# Patient Record
Sex: Male | Born: 1990
Health system: Southern US, Community
[De-identification: ages and names within clinical notes are randomized; demographics above are authoritative.]

## PROBLEM LIST (undated history)

## (undated) DIAGNOSIS — I10 Essential (primary) hypertension: Secondary | ICD-10-CM

## (undated) DIAGNOSIS — Z87442 Personal history of urinary calculi: Secondary | ICD-10-CM

## (undated) DIAGNOSIS — F32A Depression, unspecified: Secondary | ICD-10-CM

## (undated) DIAGNOSIS — F419 Anxiety disorder, unspecified: Secondary | ICD-10-CM

## (undated) DIAGNOSIS — K219 Gastro-esophageal reflux disease without esophagitis: Secondary | ICD-10-CM

## (undated) DIAGNOSIS — F329 Major depressive disorder, single episode, unspecified: Secondary | ICD-10-CM

## (undated) DIAGNOSIS — R011 Cardiac murmur, unspecified: Secondary | ICD-10-CM

## (undated) HISTORY — DX: Depression, unspecified: F32.A

## (undated) HISTORY — DX: Major depressive disorder, single episode, unspecified: F32.9

## (undated) HISTORY — DX: Anxiety disorder, unspecified: F41.9

## (undated) HISTORY — DX: Cardiac murmur, unspecified: R01.1

---

## 2015-02-14 DIAGNOSIS — F411 Generalized anxiety disorder: Secondary | ICD-10-CM | POA: Insufficient documentation

## 2016-05-19 ENCOUNTER — Encounter (INDEPENDENT_AMBULATORY_CARE_PROVIDER_SITE_OTHER): Payer: Self-pay

## 2016-07-27 ENCOUNTER — Ambulatory Visit: Payer: Self-pay | Admitting: Family Medicine

## 2016-08-04 ENCOUNTER — Encounter: Payer: Self-pay | Admitting: Family Medicine

## 2016-08-04 ENCOUNTER — Ambulatory Visit (INDEPENDENT_AMBULATORY_CARE_PROVIDER_SITE_OTHER): Payer: 59 | Admitting: Family Medicine

## 2016-08-04 VITALS — BP 135/90 | HR 116 | Ht 72.0 in | Wt 270.0 lb

## 2016-08-04 DIAGNOSIS — F331 Major depressive disorder, recurrent, moderate: Secondary | ICD-10-CM

## 2016-08-04 DIAGNOSIS — F419 Anxiety disorder, unspecified: Secondary | ICD-10-CM | POA: Diagnosis not present

## 2016-08-04 DIAGNOSIS — F329 Major depressive disorder, single episode, unspecified: Secondary | ICD-10-CM | POA: Insufficient documentation

## 2016-08-04 DIAGNOSIS — F32A Depression, unspecified: Secondary | ICD-10-CM | POA: Insufficient documentation

## 2016-08-04 MED ORDER — SERTRALINE HCL 100 MG PO TABS: 150.0000 mg | ORAL_TABLET | Freq: Every day | ORAL | 1 refills | Status: DC

## 2016-08-04 NOTE — Assessment & Plan Note (Signed)
Under good control. Continue current regimen. Continue to monitor. Recheck 6 months at physical.

## 2016-08-04 NOTE — Progress Notes (Signed)
BP 135/90   Pulse (!) 116   Ht 6' (1.829 m)   Wt 270 lb (122.5 kg)   SpO2 96%   BMI 36.62 kg/m    Subjective:    Patient ID: Jacob [name], male    DOB: 1991-05-06, 26 y.o.   MRN: [id number]  HPI: Jacob [name] is a 26 y.o. male who presents today to establish care. Hasn't seen a doctor in about a 1.5 years.   Chief Complaint  Patient presents with  . Establish Care  . Anxiety   ANXIETY/STRESS- ran out of his medicine about a month ago for about a month.  Duration:stable Anxious mood: yes  Excessive worrying: no Irritability: no  Sweating: no Nausea: no Palpitations:no Hyperventilation: no Panic attacks: no Agoraphobia: no  Obscessions/compulsions: no Depressed mood: no Depression screen [facility] 2/9 08/04/2016  Decreased Interest 0  Down, Depressed, Hopeless 0  PHQ - 2 Score 0  Altered sleeping 0  Tired, decreased energy 2  Change in appetite 1  Feeling bad or failure about yourself  0  Trouble concentrating 0  Moving slowly or fidgety/restless 0  Suicidal thoughts 0  PHQ-9 Score 3   GAD 7 : Generalized Anxiety Score 08/04/2016  Nervous, Anxious, on Edge 1  Control/stop worrying 0  Worry too much - different things 0  Trouble relaxing 0  Restless 0  Easily annoyed or irritable 1  Afraid - awful might happen 0  Total GAD 7 Score 2  Anxiety Difficulty Not difficult at all    Anhedonia: no Weight changes: no Insomnia: no   Hypersomnia: no Fatigue/loss of energy: no Feelings of worthlessness: no Feelings of guilt: no Impaired concentration/indecisiveness: no Suicidal ideations: no  Crying spells: no Recent Stressors/Life Changes: no  Active Ambulatory Problems    Diagnosis Date Noted  . Anxiety   . Depression    Resolved Ambulatory Problems    Diagnosis Date Noted  . No Resolved Ambulatory Problems   Past Medical History:  Diagnosis Date  . Anxiety   . Depression    No past surgical history on file.  Outpatient Encounter Prescriptions as of  08/04/2016  Medication Sig  . sertraline (ZOLOFT) 100 MG tablet Take 1.5 tablets (150 mg total) by mouth daily.  . [DISCONTINUED] sertraline (ZOLOFT) 100 MG tablet Take 150 mg by mouth daily.   No facility-administered encounter medications on file as of 08/04/2016.    Not on File  Social History   Social History  . Marital status: Single    Spouse name: N/A  . Number of children: N/A  . Years of education: N/A   Occupational History  . Not on file.   Social History Main Topics  . Smoking status: Never Smoker  . Smokeless tobacco: Never Used  . Alcohol use Yes     Comment: Socially  . Drug use: Unknown  . Sexual activity: Not on file   Other Topics Concern  . Not on file   Social History Narrative  . No narrative on file   Family History  Problem Relation Age of Onset  . Autoimmune disease Father   . Autoimmune disease Sister   . Autoimmune disease Brother      Review of Systems  Constitutional: Negative.   Respiratory: Negative.   Cardiovascular: Negative.   Psychiatric/Behavioral: Negative for agitation, behavioral problems, confusion, decreased concentration, dysphoric mood, hallucinations, self-injury, sleep disturbance and suicidal ideas. The patient is nervous/anxious. The patient is not hyperactive.     Per HPI unless specifically  indicated above     Objective:    BP 135/90   Pulse (!) 116   Ht 6' (1.829 m)   Wt 270 lb (122.5 kg)   SpO2 96%   BMI 36.62 kg/m   Wt Readings from Last 3 Encounters:  08/04/16 270 lb (122.5 kg)    Physical Exam  Constitutional: He is oriented to person, place, and time. He appears well-developed and well-nourished. No distress.  HENT:  Head: Normocephalic and atraumatic.  Right Ear: Hearing normal.  Left Ear: Hearing normal.  Nose: Nose normal.  Eyes: Conjunctivae and lids are normal. Right eye exhibits no discharge. Left eye exhibits no discharge. No scleral icterus.  Cardiovascular: Regular rhythm, normal heart  sounds and intact distal pulses.  Tachycardia present.  Exam reveals no gallop and no friction rub.   No murmur heard. Pulmonary/Chest: Effort normal and breath sounds normal. No respiratory distress. He has no wheezes. He has no rales. He exhibits no tenderness.  Musculoskeletal: Normal range of motion.  Neurological: He is alert and oriented to person, place, and time.  Skin: Skin is warm, dry and intact. No rash noted. No erythema. No pallor.  Psychiatric: He has a normal mood and affect. His speech is normal and behavior is normal. Judgment and thought content normal. Cognition and memory are normal.  Nursing note and vitals reviewed.   No results found for this or any previous visit.    Assessment & Plan:   Problem List Items Addressed This Visit      Other   Anxiety - Primary    Under good control. Continue current regimen. Continue to monitor. Recheck 6 months at physical.      Relevant Medications   sertraline (ZOLOFT) 100 MG tablet   Depression    Under good control. Continue current regimen. Continue to monitor. Recheck 6 months at physical.      Relevant Medications   sertraline (ZOLOFT) 100 MG tablet       Follow up plan: Return in about 6 months (around 02/04/2017) for Physical- records release please.

## 2016-11-21 ENCOUNTER — Other Ambulatory Visit: Payer: Self-pay | Admitting: Family Medicine

## 2017-02-08 ENCOUNTER — Encounter: Payer: 59 | Admitting: Family Medicine

## 2017-11-18 ENCOUNTER — Other Ambulatory Visit: Payer: Self-pay | Admitting: Family Medicine

## 2017-11-19 NOTE — Telephone Encounter (Signed)
Attempted to call patient to schedule an office visit. No answer, left message for patient to call the office so his refill for Zoloft can be complete.

## 2018-01-26 ENCOUNTER — Encounter: Payer: Self-pay | Admitting: Family Medicine

## 2018-01-26 ENCOUNTER — Ambulatory Visit (INDEPENDENT_AMBULATORY_CARE_PROVIDER_SITE_OTHER): Payer: 59 | Admitting: Family Medicine

## 2018-01-26 ENCOUNTER — Other Ambulatory Visit: Payer: Self-pay

## 2018-01-26 ENCOUNTER — Encounter

## 2018-01-26 VITALS — BP 128/85 | HR 93 | Temp 99.0°F | Ht 72.0 in | Wt 274.4 lb

## 2018-01-26 DIAGNOSIS — R030 Elevated blood-pressure reading, without diagnosis of hypertension: Secondary | ICD-10-CM | POA: Diagnosis not present

## 2018-01-26 DIAGNOSIS — Z23 Encounter for immunization: Secondary | ICD-10-CM | POA: Diagnosis not present

## 2018-01-26 DIAGNOSIS — Z13220 Encounter for screening for lipoid disorders: Secondary | ICD-10-CM

## 2018-01-26 DIAGNOSIS — F3342 Major depressive disorder, recurrent, in full remission: Secondary | ICD-10-CM | POA: Diagnosis not present

## 2018-01-26 DIAGNOSIS — E669 Obesity, unspecified: Secondary | ICD-10-CM

## 2018-01-26 LAB — UA/M W/RFLX CULTURE, ROUTINE
Bilirubin, UA: NEGATIVE
Glucose, UA: NEGATIVE
Ketones, UA: NEGATIVE
Leukocytes, UA: NEGATIVE
Nitrite, UA: NEGATIVE
Protein, UA: NEGATIVE
RBC, UA: NEGATIVE
Specific Gravity, UA: 1.02 (ref 1.005–1.030)
Urobilinogen, Ur: 0.2 mg/dL (ref 0.2–1.0)
pH, UA: 7.5 (ref 5.0–7.5)

## 2018-01-26 LAB — BAYER DCA HB A1C WAIVED: HB A1C (BAYER DCA - WAIVED): 4.9 % (ref <7.0)

## 2018-01-26 MED ORDER — SERTRALINE HCL 100 MG PO TABS: ORAL_TABLET | ORAL | 1 refills | Status: DC

## 2018-01-26 NOTE — Progress Notes (Signed)
BP 128/85 (BP Location: Left Arm, Cuff Size: Normal)   Pulse 93   Temp 99 F (37.2 C) (Oral)   Ht 6' (1.829 m)   Wt 274 lb 6.4 oz (124.5 kg)   SpO2 97%   BMI 37.22 kg/m    Subjective:    Patient ID: Jacob [name], male    DOB: 05-17-1991, 27 y.o.   MRN: [id number]  HPI: Jacob [name] is a 27 y.o. male who presents today after being lost to follow up for 15 months.   Chief Complaint  Patient presents with  . Depression    pt would like to discuss about zoloft  . Anxiety   DEPRESSION-  Mood status: stable Satisfied with current treatment?: yes Symptom severity: mild  Duration of current treatment : chronic Side effects: no Medication compliance: excellent compliance Psychotherapy/counseling: no  Previous psychiatric medications: zoloft Depressed mood: no Anxious mood: no Anhedonia: no Significant weight loss or gain: no Insomnia: no  Fatigue: no Feelings of worthlessness or guilt: no Impaired concentration/indecisiveness: no Suicidal ideations: no Hopelessness: no Crying spells: no Depression screen [facility] 2/9 01/26/2018 08/04/2016  Decreased Interest 0 0  Down, Depressed, Hopeless 0 0  PHQ - 2 Score 0 0  Altered sleeping 1 0  Tired, decreased energy 1 2  Change in appetite 0 1  Feeling bad or failure about yourself  0 0  Trouble concentrating 0 0  Moving slowly or fidgety/restless 0 0  Suicidal thoughts 0 0  PHQ-9 Score 2 3  Difficult doing work/chores Not difficult at all -   GAD 7 : Generalized Anxiety Score 01/26/2018 08/04/2016  Nervous, Anxious, on Edge 0 1  Control/stop worrying 0 0  Worry too much - different things 0 0  Trouble relaxing 0 0  Restless 1 0  Easily annoyed or irritable 1 1  Afraid - awful might happen 0 0  Total GAD 7 Score 2 2  Anxiety Difficulty Not difficult at all Not difficult at all     Relevant past medical, surgical, family and social history reviewed and updated as indicated. Interim medical history since our last visit  reviewed. Allergies and medications reviewed and updated.  Review of Systems  Constitutional: Negative.   Respiratory: Negative.   Cardiovascular: Negative.   Psychiatric/Behavioral: Negative.     Per HPI unless specifically indicated above     Objective:    BP 128/85 (BP Location: Left Arm, Cuff Size: Normal)   Pulse 93   Temp 99 F (37.2 C) (Oral)   Ht 6' (1.829 m)   Wt 274 lb 6.4 oz (124.5 kg)   SpO2 97%   BMI 37.22 kg/m   Wt Readings from Last 3 Encounters:  01/26/18 274 lb 6.4 oz (124.5 kg)  08/04/16 270 lb (122.5 kg)    Physical Exam  Constitutional: He is oriented to person, place, and time. He appears well-developed and well-nourished. No distress.  HENT:  Head: Normocephalic and atraumatic.  Right Ear: Hearing normal.  Left Ear: Hearing normal.  Nose: Nose normal.  Eyes: Conjunctivae and lids are normal. Right eye exhibits no discharge. Left eye exhibits no discharge. No scleral icterus.  Cardiovascular: Normal rate, regular rhythm, normal heart sounds and intact distal pulses. Exam reveals no gallop and no friction rub.  No murmur heard. Pulmonary/Chest: Effort normal and breath sounds normal. No stridor. No respiratory distress. He has no wheezes. He has no rales. He exhibits no tenderness.  Musculoskeletal: Normal range of motion.  Neurological: He is alert  and oriented to person, place, and time.  Skin: Skin is warm, dry and intact. Capillary refill takes less than 2 seconds. No rash noted. He is not diaphoretic. No erythema. No pallor.  Psychiatric: He has a normal mood and affect. His speech is normal and behavior is normal. Judgment and thought content normal. Cognition and memory are normal.  Nursing note and vitals reviewed.   No results found for this or any previous visit.    Assessment & Plan:   Problem List Items Addressed This Visit      Other   Depression - Primary    Under good control on current regimen. Continue current regimen.  Continue to monitor. Call with any concerns. Refills given today.      Relevant Medications   sertraline (ZOLOFT) 100 MG tablet   Obesity (BMI 30-39.9)    Will work on diet and exercise. Checking labs today. Await results.       Relevant Orders   CBC with Differential/Platelet   Bayer DCA Hb A1c Waived   Comprehensive metabolic panel   TSH   UA/M w/rflx Culture, Routine    Other Visit Diagnoses    Flu vaccine need       Flu shot given today.   Relevant Orders   Flu Vaccine QUAD 36+ mos IM (Completed)   Need for Tdap vaccination       Tdap given today.   Relevant Orders   Tdap vaccine greater than or equal to 7yo IM (Completed)   Elevated blood pressure reading       Will work on [employer] and exercise. Recheck at physical. Call with any concerns.    Relevant Orders   CBC with Differential/Platelet   Comprehensive metabolic panel   UA/M w/rflx Culture, Routine   Screening for cholesterol level       Labs drawn today. Await results.    Relevant Orders   Lipid Panel w/o Chol/HDL Ratio   UA/M w/rflx Culture, Routine       Follow up plan: Return in about 6 months (around 07/27/2018) for physical.

## 2018-01-26 NOTE — Assessment & Plan Note (Signed)
Under good control on current regimen. Continue current regimen. Continue to monitor. Call with any concerns. Refills given today.   

## 2018-01-26 NOTE — Patient Instructions (Addendum)
Tdap Vaccine (Tetanus, Diphtheria and Pertussis): What You Need to Know 1. Why get vaccinated? Tetanus, diphtheria and pertussis are very serious diseases. Tdap vaccine can protect us from these diseases. And, Tdap vaccine given to pregnant women can protect newborn babies against pertussis. TETANUS (Lockjaw) is rare in the [place] today. It causes painful muscle tightening and stiffness, usually all over the body.  It can lead to tightening of muscles in the head and neck so you can't open your mouth, swallow, or sometimes even breathe. Tetanus kills about 1 out of 10 people who are infected even after receiving the best medical care.  DIPHTHERIA is also rare in the [place] today. It can cause a thick coating to form in the back of the throat.  It can lead to breathing problems, heart failure, paralysis, and death.  PERTUSSIS (Whooping Cough) causes severe coughing spells, which can cause difficulty breathing, vomiting and disturbed sleep.  It can also lead to weight loss, incontinence, and rib fractures. Up to 2 in 100 adolescents and 5 in 100 adults with pertussis are hospitalized or have complications, which could include pneumonia or death.  These diseases are caused by bacteria. Diphtheria and pertussis are spread from person to person through secretions from coughing or sneezing. Tetanus enters the body through cuts, scratches, or wounds. Before vaccines, as many as 200,000 cases of diphtheria, 200,000 cases of pertussis, and hundreds of cases of tetanus, were reported in the [place] each year. Since vaccination began, reports of cases for tetanus and diphtheria have dropped by about 99% and for pertussis by about 80%. 2. Tdap vaccine Tdap vaccine can protect adolescents and adults from tetanus, diphtheria, and pertussis. One dose of Tdap is routinely given at age 11 or 12. People who did not get Tdap at that age should get it as soon as possible. Tdap is especially  important for healthcare professionals and anyone having close contact with a baby younger than 12 months. Pregnant women should get a dose of Tdap during every pregnancy, to protect the newborn from pertussis. Infants are most at risk for severe, life-threatening complications from pertussis. Another vaccine, called Td, protects against tetanus and diphtheria, but not pertussis. A Td booster should be given every 10 years. Tdap may be given as one of these boosters if you have never gotten Tdap before. Tdap may also be given after a severe cut or burn to prevent tetanus infection. Your doctor or the person giving you the vaccine can give you more information. Tdap may safely be given at the same time as other vaccines. 3. Some people should not get this vaccine  A person who has ever had a life-threatening allergic reaction after a previous dose of any diphtheria, tetanus or pertussis containing vaccine, OR has a severe allergy to any part of this vaccine, should not get Tdap vaccine. Tell the person giving the vaccine about any severe allergies.  Anyone who had coma or long repeated seizures within 7 days after a childhood dose of DTP or DTaP, or a previous dose of Tdap, should not get Tdap, unless a cause other than the vaccine was found. They can still get Td.  Talk to your doctor if you: ? have seizures or another nervous system problem, ? had severe pain or swelling after any vaccine containing diphtheria, tetanus or pertussis, ? ever had a condition called Guillain-Barr Syndrome (GBS), ? aren't feeling well on the day the shot is scheduled. 4. Risks With any medicine, including   vaccines, there is a chance of side effects. These are usually mild and go away on their own. Serious reactions are also possible but are rare. Most people who get Tdap vaccine do not have any problems with it. Mild problems following Tdap: (Did not interfere with activities)  Pain where the shot was given (about  3 in 4 adolescents or 2 in 3 adults)  Redness or swelling where the shot was given (about 1 person in 5)  Mild fever of at least 100.4F (up to about 1 in 25 adolescents or 1 in 100 adults)  Headache (about 3 or 4 people in 10)  Tiredness (about 1 person in 3 or 4)  Nausea, vomiting, diarrhea, stomach ache (up to 1 in 4 adolescents or 1 in 10 adults)  Chills, sore joints (about 1 person in 10)  Body aches (about 1 person in 3 or 4)  Rash, swollen glands (uncommon)  Moderate problems following Tdap: (Interfered with activities, but did not require medical attention)  Pain where the shot was given (up to 1 in 5 or 6)  Redness or swelling where the shot was given (up to about 1 in 16 adolescents or 1 in 12 adults)  Fever over 102F (about 1 in 100 adolescents or 1 in 250 adults)  Headache (about 1 in 7 adolescents or 1 in 10 adults)  Nausea, vomiting, diarrhea, stomach ache (up to 1 or 3 people in 100)  Swelling of the entire arm where the shot was given (up to about 1 in 500).  Severe problems following Tdap: (Unable to perform usual activities; required medical attention)  Swelling, severe pain, bleeding and redness in the arm where the shot was given (rare).  Problems that could happen after any vaccine:  People sometimes faint after a medical procedure, including vaccination. Sitting or lying down for about 15 minutes can help prevent fainting, and injuries caused by a fall. Tell your doctor if you feel dizzy, or have vision changes or ringing in the ears.  Some people get severe pain in the shoulder and have difficulty moving the arm where a shot was given. This happens very rarely.  Any medication can cause a severe allergic reaction. Such reactions from a vaccine are very rare, estimated at fewer than 1 in a million doses, and would happen within a few minutes to a few hours after the vaccination. As with any medicine, there is a very remote chance of a vaccine  causing a serious injury or death. The safety of vaccines is always being monitored. For more information, visit: www.cdc.gov/vaccinesafety/ 5. What if there is a serious problem? What should I look for? Look for anything that concerns you, such as signs of a severe allergic reaction, very high fever, or unusual behavior. Signs of a severe allergic reaction can include hives, swelling of the face and throat, difficulty breathing, a fast heartbeat, dizziness, and weakness. These would usually start a few minutes to a few hours after the vaccination. What should I do?  If you think it is a severe allergic reaction or other emergency that can't wait, call 9-1-1 or get the person to the nearest hospital. Otherwise, call your doctor.  Afterward, the reaction should be reported to the Vaccine Adverse Event Reporting System (VAERS). Your doctor might file this report, or you can do it yourself through the VAERS web site at www.vaers.hhs.gov, or by calling 1-800-822-7967. ? VAERS does not give medical advice. 6. The National Vaccine Injury Compensation Program The National   Vaccine Injury Compensation Program (VICP) is a federal program that was created to compensate people who may have been injured by certain vaccines. Persons who believe they may have been injured by a vaccine can learn about the program and about filing a claim by calling 628-509-7091 or visiting the [place] website at GoldCloset.com.ee. There is a time limit to file a claim for compensation. 7. How can I learn more?  Ask your doctor. He or she can give you the vaccine package insert or suggest other sources of information.  Call your local or state health department.  Contact the Centers for Disease Control and Prevention (CDC): ? Call (872) 268-8698 (1-800-CDC-INFO) or ? Visit CDC's website at http://hunter.com/ CDC Tdap Vaccine VIS (07/18/13) This information is not intended to replace advice given to you by your  health care provider. Make sure you discuss any questions you have with your health care provider. Document Released: 11/10/2011 Document Revised: 01/30/2016 Document Reviewed: 01/30/2016 Elsevier Interactive Patient Education  2017 [place] DASH stands for "Dietary Approaches to Stop Hypertension." The DASH eating plan is a healthy eating plan that has been shown to reduce high blood pressure (hypertension). It may also reduce your risk for type 2 diabetes, heart disease, and stroke. The DASH eating plan may also help with weight loss. What are tips for following this plan? General guidelines  Avoid eating more than 2,300 mg (milligrams) of salt (sodium) a day. If you have hypertension, you may need to reduce your sodium intake to 1,500 mg a day.  Limit alcohol intake to no more than 1 drink a day for nonpregnant women and 2 drinks a day for men. One drink equals 12 oz of beer, 5 oz of wine, or 1 oz of hard liquor.  Work with your health care provider to maintain a healthy body weight or to lose weight. Ask what an ideal weight is for you.  Get at least 30 minutes of exercise that causes your heart to beat faster (aerobic exercise) most days of the week. Activities may include walking, swimming, or biking.  Work with your health care provider or diet and nutrition specialist (dietitian) to adjust your eating plan to your individual calorie needs. Reading food labels  Check food labels for the amount of sodium per serving. Choose foods with less than 5 percent of the Daily Value of sodium. Generally, foods with less than 300 mg of sodium per serving fit into this eating plan.  To find whole grains, look for the word "whole" as the first word in the ingredient list. Shopping  Buy products labeled as "low-sodium" or "no salt added."  Buy fresh foods. Avoid canned foods and premade or frozen meals. Cooking  Avoid adding salt when cooking. Use salt-free seasonings  or herbs instead of table salt or sea salt. Check with your health care provider or pharmacist before using salt substitutes.  Do not fry foods. Cook foods using healthy methods such as baking, boiling, grilling, and broiling instead.  Cook with heart-healthy oils, such as olive, canola, soybean, or sunflower oil. Meal planning   Eat a balanced diet that includes: ? 5 or more servings of fruits and vegetables each day. At each meal, try to fill half of your plate with fruits and vegetables. ? Up to 6-8 servings of whole grains each day. ? Less than 6 oz of lean meat, poultry, or fish each day. A 3-oz serving of meat is about the same size as a deck  of cards. One egg equals 1 oz. ? 2 servings of low-fat dairy each day. ? A serving of nuts, seeds, or beans 5 times each week. ? Heart-healthy fats. Healthy fats called Omega-3 fatty acids are found in foods such as flaxseeds and coldwater fish, like sardines, salmon, and mackerel.  Limit how much you eat of the following: ? Canned or prepackaged foods. ? Food that is high in trans fat, such as fried foods. ? Food that is high in saturated fat, such as fatty meat. ? Sweets, desserts, sugary drinks, and other foods with added sugar. ? Full-fat dairy products.  Do not salt foods before eating.  Try to eat at least 2 vegetarian meals each week.  Eat more home-cooked food and less restaurant, buffet, and fast food.  When eating at a restaurant, ask that your food be prepared with less salt or no salt, if possible. What foods are recommended? The items listed may not be a complete list. Talk with your dietitian about what dietary choices are best for you. Grains Whole-grain or whole-wheat bread. Whole-grain or whole-wheat pasta. Brown rice. [place]. Bulgur. Whole-grain and low-sodium cereals. Pita bread. Low-fat, low-sodium crackers. Whole-wheat flour tortillas. Vegetables Fresh or frozen vegetables (raw, steamed, roasted, or  grilled). Low-sodium or reduced-sodium tomato and vegetable juice. Low-sodium or reduced-sodium tomato sauce and tomato paste. Low-sodium or reduced-sodium canned vegetables. Fruits All fresh, dried, or frozen fruit. Canned fruit in natural juice (without added sugar). Meat and other protein foods Skinless chicken or [place]. Ground chicken or [place]. Pork with fat trimmed off. Fish and seafood. Egg whites. Dried beans, peas, or lentils. Unsalted nuts, nut butters, and seeds. Unsalted canned beans. Lean cuts of beef with fat trimmed off. Low-sodium, lean deli meat. Dairy Low-fat (1%) or fat-free (skim) milk. Fat-free, low-fat, or reduced-fat cheeses. Nonfat, low-sodium ricotta or cottage cheese. Low-fat or nonfat yogurt. Low-fat, low-sodium cheese. Fats and oils Soft margarine without trans fats. Vegetable oil. Low-fat, reduced-fat, or light mayonnaise and salad dressings (reduced-sodium). Canola, safflower, olive, soybean, and sunflower oils. Avocado. Seasoning and other foods Herbs. Spices. Seasoning mixes without salt. Unsalted popcorn and pretzels. Fat-free sweets. What foods are not recommended? The items listed may not be a complete list. Talk with your dietitian about what dietary choices are best for you. Grains Baked goods made with fat, such as croissants, muffins, or some breads. Dry pasta or rice meal packs. Vegetables Creamed or fried vegetables. Vegetables in a cheese sauce. Regular canned vegetables (not low-sodium or reduced-sodium). Regular canned tomato sauce and paste (not low-sodium or reduced-sodium). Regular tomato and vegetable juice (not low-sodium or reduced-sodium). Angie Fava. Olives. Fruits Canned fruit in a light or heavy syrup. Fried fruit. Fruit in cream or butter sauce. Meat and other protein foods Fatty cuts of meat. Ribs. Fried meat. Berniece Salines. Sausage. Bologna and other processed lunch meats. Salami. Fatback. Hotdogs. Bratwurst. Salted nuts and seeds. Canned beans with  added salt. Canned or smoked fish. Whole eggs or egg yolks. Chicken or [place] with skin. Dairy Whole or 2% milk, cream, and half-and-half. Whole or full-fat cream cheese. Whole-fat or sweetened yogurt. Full-fat cheese. Nondairy creamers. Whipped toppings. Processed cheese and cheese spreads. Fats and oils Butter. Stick margarine. Lard. Shortening. Ghee. Bacon fat. Tropical oils, such as coconut, palm kernel, or palm oil. Seasoning and other foods Salted popcorn and pretzels. Onion salt, garlic salt, seasoned salt, table salt, and sea salt. Worcestershire sauce. Tartar sauce. Barbecue sauce. Teriyaki sauce. Soy sauce, including reduced-sodium. Steak sauce. Canned and  packaged gravies. Fish sauce. Oyster sauce. Cocktail sauce. Horseradish that you find on the shelf. Ketchup. Mustard. Meat flavorings and tenderizers. Bouillon cubes. Hot sauce and Tabasco sauce. Premade or packaged marinades. Premade or packaged taco seasonings. Relishes. Regular salad dressings. Where to find more information:  National Heart, Lung, and [place]: https://wilson-eaton.com/  American Heart Association: www.heart.org Summary  The DASH eating plan is a healthy eating plan that has been shown to reduce high blood pressure (hypertension). It may also reduce your risk for type 2 diabetes, heart disease, and stroke.  With the DASH eating plan, you should limit salt (sodium) intake to 2,300 mg a day. If you have hypertension, you may need to reduce your sodium intake to 1,500 mg a day.  When on the DASH eating plan, aim to eat more fresh fruits and vegetables, whole grains, lean proteins, low-fat dairy, and heart-healthy fats.  Work with your health care provider or diet and nutrition specialist (dietitian) to adjust your eating plan to your individual calorie needs. This information is not intended to replace advice given to you by your health care provider. Make sure you discuss any questions you have with your health care  provider. Document Released: 04/30/2011 Document Revised: 05/04/2016 Document Reviewed: 05/04/2016 Elsevier Interactive Patient Education  2018 [employer].  Preventing Hypertension Hypertension, commonly called high blood pressure, is when the force of blood pumping through the arteries is too strong. Arteries are blood vessels that carry blood from the heart throughout the body. Over time, hypertension can damage the arteries and decrease blood flow to important parts of the body, including the brain, heart, and kidneys. Often, hypertension does not cause symptoms until blood pressure is very high. For this reason, it is important to have your blood pressure checked on a regular basis. Hypertension can often be prevented with diet and lifestyle changes. If you already have hypertension, you can control it with diet and lifestyle changes, as well as medicine. What nutrition changes can be made? Maintain a healthy diet. This includes:  Eating less salt (sodium). Ask your health care provider how much sodium is safe for you to have. The general recommendation is to consume less than 1 tsp (2,300 mg) of sodium a day. ? Do not add salt to your food. ? Choose low-sodium options when grocery shopping and eating out.  Limiting fats in your diet. You can do this by eating low-fat or fat-free dairy products and by eating less red meat.  Eating more fruits, vegetables, and whole grains. Make a goal to eat: ? 1-2 cups of fresh fruits and vegetables each day. ? 3-4 servings of whole grains each day.  Avoiding foods and beverages that have added sugars.  Eating fish that contain healthy fats (omega-3 fatty acids), such as mackerel or salmon.  If you need help putting together a healthy eating plan, try the DASH diet. This diet is high in fruits, vegetables, and whole grains. It is low in sodium, red meat, and added sugars. DASH stands for Dietary Approaches to Stop Hypertension. What lifestyle  changes can be made?  Lose weight if you are overweight. Losing just 3?5% of your body weight can help prevent or control hypertension. ? For example, if your present weight is 200 lb (91 kg), a loss of 3-5% of your weight means losing 6-10 lb (2.7-4.5 kg). ? Ask your health care provider to help you with a diet and exercise plan to safely lose weight.  Get enough exercise. Do at least  150 minutes of moderate-intensity exercise each week. ? You could do this in short exercise sessions several times a day, or you could do longer exercise sessions a few times a week. For example, you could take a brisk 10-minute walk or bike ride, 3 times a day, for 5 days a week.  Find ways to reduce stress, such as exercising, meditating, listening to music, or taking a yoga class. If you need help reducing stress, ask your health care provider.  Do not smoke. This includes e-cigarettes. Chemicals in tobacco and nicotine products raise your blood pressure each time you smoke. If you need help quitting, ask your health care provider.  Avoid alcohol. If you drink alcohol, limit alcohol intake to no more than 1 drink a day for nonpregnant women and 2 drinks a day for men. One drink equals 12 oz of beer, 5 oz of wine, or 1 oz of hard liquor. Why are these changes important? Diet and lifestyle changes can help you prevent hypertension, and they may make you feel better overall and improve your quality of life. If you have hypertension, making these changes will help you control it and help prevent major complications, such as:  Hardening and narrowing of arteries that supply blood to: ? Your heart. This can cause a heart attack. ? Your brain. This can cause a stroke. ? Your kidneys. This can cause kidney failure.  Stress on your heart muscle, which can cause heart failure.  What can I do to lower my risk?  Work with your health care provider to make a hypertension prevention plan that works for you. Follow your  plan and keep all follow-up visits as told by your health care provider.  Learn how to check your blood pressure at home. Make sure that you know your personal target blood pressure, as told by your health care provider. How is this treated? In addition to diet and lifestyle changes, your health care provider may recommend medicines to help lower your blood pressure. You may need to try a few different medicines to find what works best for you. You also may need to take more than one medicine. Take over-the-counter and prescription medicines only as told by your health care provider. Where to find support: Your health care provider can help you prevent hypertension and help you keep your blood pressure at a healthy level. Your local hospital or your community may also provide support services and prevention programs. The American Heart Association offers an online support network at: CheapBootlegs.com.cy Where to find more information: Learn more about hypertension from:  National Heart, Lung, and Blood Institute: ElectronicHangman.is  Centers for Disease Control and Prevention: https://ingram.com/  American Academy of Family Physicians: http://familydoctor.org/familydoctor/en/diseases-conditions/high-blood-pressure.printerview.all.html  Learn more about the DASH diet from:  [place], Lung, and [place]: https://www.reyes.com/  Contact a health care provider if:  You think you are having a reaction to medicines you have taken.  You have recurrent headaches or feel dizzy.  You have swelling in your ankles.  You have trouble with your vision. Summary  Hypertension often does not cause any symptoms until blood pressure is very high. It is important to get your blood pressure checked regularly.  Diet and lifestyle changes are the most important steps in preventing  hypertension.  By keeping your blood pressure in a healthy range, you can prevent complications like heart attack, heart failure, stroke, and kidney failure.  Work with your health care provider to make a hypertension prevention plan that works  for you. This information is not intended to replace advice given to you by your health care provider. Make sure you discuss any questions you have with your health care provider. Document Released: 05/26/2015 Document Revised: 01/20/2016 Document Reviewed: 01/20/2016 Elsevier Interactive Patient Education  [employer].

## 2018-01-26 NOTE — Assessment & Plan Note (Signed)
Will work on diet and exercise. Checking labs today. Await results.

## 2018-01-27 ENCOUNTER — Encounter: Payer: Self-pay | Admitting: Family Medicine

## 2018-01-27 LAB — CBC WITH DIFFERENTIAL/PLATELET
BASOS ABS: 0 10*3/uL (ref 0.0–0.2)
Basos: 0 %
EOS (ABSOLUTE): 0.1 10*3/uL (ref 0.0–0.4)
EOS: 2 %
HEMATOCRIT: 43.7 % (ref 37.5–51.0)
Hemoglobin: 15.4 g/dL (ref 13.0–17.7)
IMMATURE GRANULOCYTES: 1 %
Immature Grans (Abs): 0 10*3/uL (ref 0.0–0.1)
LYMPHS ABS: 1.4 10*3/uL (ref 0.7–3.1)
Lymphs: 22 %
MCH: 30.6 pg (ref 26.6–33.0)
MCHC: 35.2 g/dL (ref 31.5–35.7)
MCV: 87 fL (ref 79–97)
MONOCYTES: 7 %
Monocytes Absolute: 0.4 10*3/uL (ref 0.1–0.9)
NEUTROS PCT: 68 %
Neutrophils Absolute: 4.6 10*3/uL (ref 1.4–7.0)
Platelets: 227 10*3/uL (ref 150–450)
RBC: 5.03 x10E6/uL (ref 4.14–5.80)
RDW: 12.3 % (ref 12.3–15.4)
WBC: 6.6 10*3/uL (ref 3.4–10.8)

## 2018-01-27 LAB — COMPREHENSIVE METABOLIC PANEL
ALK PHOS: 125 IU/L — AB (ref 39–117)
ALT: 34 IU/L (ref 0–44)
AST: 25 IU/L (ref 0–40)
Albumin/Globulin Ratio: 2 (ref 1.2–2.2)
Albumin: 4.5 g/dL (ref 3.5–5.5)
BUN/Creatinine Ratio: 12 (ref 9–20)
BUN: 10 mg/dL (ref 6–20)
Bilirubin Total: 0.4 mg/dL (ref 0.0–1.2)
CALCIUM: 9.9 mg/dL (ref 8.7–10.2)
CO2: 25 mmol/L (ref 20–29)
CREATININE: 0.84 mg/dL (ref 0.76–1.27)
Chloride: 102 mmol/L (ref 96–106)
GFR calc Af Amer: 140 mL/min/{1.73_m2} (ref >59)
GFR, EST NON AFRICAN AMERICAN: 121 mL/min/{1.73_m2} (ref >59)
GLOBULIN, TOTAL: 2.2 g/dL (ref 1.5–4.5)
GLUCOSE: 106 mg/dL — AB (ref 65–99)
Potassium: 4.3 mmol/L (ref 3.5–5.2)
Sodium: 144 mmol/L (ref 134–144)
Total Protein: 6.7 g/dL (ref 6.0–8.5)

## 2018-01-27 LAB — LIPID PANEL W/O CHOL/HDL RATIO
Cholesterol, Total: 161 mg/dL (ref 100–199)
HDL: 34 mg/dL — AB (ref >39)
LDL CALC: 79 mg/dL (ref 0–99)
Triglycerides: 238 mg/dL — ABNORMAL HIGH (ref 0–149)
VLDL CHOLESTEROL CAL: 48 mg/dL — AB (ref 5–40)

## 2018-01-27 LAB — TSH: TSH: 1.76 u[IU]/mL (ref 0.450–4.500)

## 2018-06-13 ENCOUNTER — Encounter: Payer: Self-pay | Admitting: Family Medicine

## 2018-07-25 ENCOUNTER — Other Ambulatory Visit: Payer: Self-pay | Admitting: Family Medicine

## 2018-07-25 NOTE — Telephone Encounter (Signed)
Requested Prescriptions  Pending Prescriptions Disp Refills  . sertraline (ZOLOFT) 100 MG tablet [Pharmacy Med Name: SERTRALINE HCL 100 MG TABLET] 45 tablet 1    Sig: TAKE 1&1/2 TABLET BY MOUTH ONCE DAILY     Psychiatry:  Antidepressants - SSRI Passed - 07/25/2018  2:05 AM      Passed - Completed PHQ-2 or PHQ-9 in the last 360 days.      Passed - Valid encounter within last 6 months    Recent Outpatient Visits          6 months ago Recurrent major depressive disorder, in full remission [facility])   [place], [place], DO   1 year ago [place], [place], DO      Future Appointments            In 3 weeks Wynetta Emery, Barb Merino, DO [facility]

## 2018-08-11 ENCOUNTER — Encounter: Payer: Self-pay | Admitting: Family Medicine

## 2018-08-18 ENCOUNTER — Encounter: Payer: Self-pay | Admitting: Family Medicine

## 2018-08-27 ENCOUNTER — Other Ambulatory Visit: Payer: Self-pay | Admitting: Family Medicine

## 2018-08-29 NOTE — Telephone Encounter (Signed)
Needs virtual visit follow up

## 2018-09-07 NOTE — Telephone Encounter (Signed)
Called pt to schedule virtual follow up no answer left voicemail.

## 2018-09-25 ENCOUNTER — Other Ambulatory Visit: Payer: Self-pay | Admitting: Family Medicine

## 2018-09-25 NOTE — Telephone Encounter (Signed)
Please review

## 2018-09-26 ENCOUNTER — Encounter: Payer: Self-pay | Admitting: Family Medicine

## 2018-09-26 NOTE — Telephone Encounter (Signed)
Needs appointment

## 2018-09-26 NOTE — Telephone Encounter (Signed)
LVM for pt to call back also printing letter to mail.

## 2018-09-26 NOTE — Telephone Encounter (Signed)
Called pt to schedule virtual visit, no answer, left voicemail.

## 2018-10-19 ENCOUNTER — Other Ambulatory Visit: Payer: Self-pay | Admitting: Family Medicine

## 2018-10-19 NOTE — Telephone Encounter (Signed)
Pt needs an appointment for refill for sertraline mg  Tab Last office visit 01/26/2018 No upcoming appointment Dr. Park Liter

## 2018-10-19 NOTE — Telephone Encounter (Signed)
Called pt to schedule appt, no answer, left voicemail.

## 2018-11-19 ENCOUNTER — Other Ambulatory Visit: Payer: Self-pay | Admitting: Family Medicine

## 2018-12-02 ENCOUNTER — Ambulatory Visit (INDEPENDENT_AMBULATORY_CARE_PROVIDER_SITE_OTHER): Payer: 59 | Admitting: Family Medicine

## 2018-12-02 ENCOUNTER — Encounter: Payer: Self-pay | Admitting: Family Medicine

## 2018-12-02 DIAGNOSIS — F331 Major depressive disorder, recurrent, moderate: Secondary | ICD-10-CM

## 2018-12-02 MED ORDER — SERTRALINE HCL 100 MG PO TABS: ORAL_TABLET | ORAL | 3 refills | Status: DC

## 2018-12-02 NOTE — Progress Notes (Signed)
There were no vitals taken for this visit.   Subjective:    Patient ID: Jacob [name], male    DOB: 02-13-1991, 27 y.o.   MRN: [id number]  HPI: Jacob [name] is a 28 y.o. male  Chief Complaint  Patient presents with  . Depression   ANXIETY/DEPRESSION- was doing well on the sertraline and started to wean down on 50mg  daily. When he's on the full dosage, he generally does well and seems to be doing OK Duration:exacerbated Anxious mood: yes  Excessive worrying: yes Irritability: no  Sweating: no Nausea: no Palpitations:no Hyperventilation: no Panic attacks: no Agoraphobia: no  Obscessions/compulsions: yes Depressed mood: yes Depression screen [facility] 2/9 12/02/2018 01/26/2018 08/04/2016  Decreased Interest 2 0 0  Down, Depressed, Hopeless 1 0 0  PHQ - 2 Score 3 0 0  Altered sleeping 1 1 0  Tired, decreased energy 3 1 2   Change in appetite 2 0 1  Feeling bad or failure about yourself  1 0 0  Trouble concentrating 1 0 0  Moving slowly or fidgety/restless 0 0 0  Suicidal thoughts 0 0 0  PHQ-9 Score 11 2 3   Difficult doing work/chores - Not difficult at all -   GAD 7 : Generalized Anxiety Score 12/02/2018 01/26/2018 08/04/2016  Nervous, Anxious, on Edge 0 0 1  Control/stop worrying 0 0 0  Worry too much - different things 0 0 0  Trouble relaxing 0 0 0  Restless 0 1 0  Easily annoyed or irritable 1 1 1   Afraid - awful might happen 0 0 0  Total GAD 7 Score 1 2 2   Anxiety Difficulty Somewhat difficult Not difficult at all Not difficult at all   Anhedonia: no Weight changes: no Insomnia: no   Hypersomnia: no Fatigue/loss of energy: yes Feelings of worthlessness: yes Feelings of guilt: yes Impaired concentration/indecisiveness: no Suicidal ideations: no  Crying spells: no Recent Stressors/Life Changes: no   Relationship problems: yes   Family stress: no     Financial stress: no    Job stress: no    Recent death/loss: no  Relevant past medical, surgical, family and social  history reviewed and updated as indicated. Interim medical history since our last visit reviewed. Allergies and medications reviewed and updated.  Review of Systems  Constitutional: Negative.   Respiratory: Negative.   Cardiovascular: Negative.   Musculoskeletal: Negative.   Skin: Negative.     Per HPI unless specifically indicated above     Objective:    There were no vitals taken for this visit.  Wt Readings from Last 3 Encounters:  01/26/18 274 lb 6.4 oz (124.5 kg)  08/04/16 270 lb (122.5 kg)    Physical Exam Vitals signs and nursing note reviewed.  Constitutional:      General: He is not in acute distress.    Appearance: Normal appearance. He is not ill-appearing, toxic-appearing or diaphoretic.  HENT:     Head: Normocephalic and atraumatic.     Right Ear: External ear normal.     Left Ear: External ear normal.     Nose: Nose normal.     Mouth/Throat:     Mouth: Mucous membranes are moist.     Pharynx: Oropharynx is clear.  Eyes:     General: No scleral icterus.       Right eye: No discharge.        Left eye: No discharge.     Conjunctiva/sclera: Conjunctivae normal.     Pupils: Pupils are equal, round,  and reactive to light.  Neck:     Musculoskeletal: Normal range of motion.  Pulmonary:     Effort: Pulmonary effort is normal. No respiratory distress.     Comments: Speaking in full sentences Musculoskeletal: Normal range of motion.  Skin:    Coloration: Skin is not jaundiced or pale.     Findings: No bruising, erythema, lesion or rash.  Neurological:     Mental Status: He is alert and oriented to person, place, and time. Mental status is at baseline.  Psychiatric:        Mood and Affect: Mood normal.        Behavior: Behavior normal.        Thought Content: Thought content normal.        Judgment: Judgment normal.     Results for orders placed or performed in visit on 01/26/18  CBC with Differential/Platelet  Result Value Ref Range   WBC 6.6 3.4 -  10.8 x10E3/uL   RBC 5.03 4.14 - 5.80 x10E6/uL   Hemoglobin 15.4 13.0 - 17.7 g/dL   Hematocrit 43.7 37.5 - 51.0 %   MCV 87 79 - 97 fL   MCH 30.6 26.6 - 33.0 pg   MCHC 35.2 31.5 - 35.7 g/dL   RDW 12.3 12.3 - 15.4 %   Platelets 227 150 - 450 x10E3/uL   Neutrophils 68 Not Estab. %   Lymphs 22 Not Estab. %   Monocytes 7 Not Estab. %   Eos 2 Not Estab. %   Basos 0 Not Estab. %   Neutrophils Absolute 4.6 1.4 - 7.0 x10E3/uL   Lymphocytes Absolute 1.4 0.7 - 3.1 x10E3/uL   Monocytes Absolute 0.4 0.1 - 0.9 x10E3/uL   EOS (ABSOLUTE) 0.1 0.0 - 0.4 x10E3/uL   Basophils Absolute 0.0 0.0 - 0.2 x10E3/uL   Immature Granulocytes 1 Not Estab. %   Immature Grans (Abs) 0.0 0.0 - 0.1 x10E3/uL  Bayer DCA Hb A1c Waived  Result Value Ref Range   HB A1C (BAYER DCA - WAIVED) 4.9 <7.0 %  Comprehensive metabolic panel  Result Value Ref Range   Glucose 106 (H) 65 - 99 mg/dL   BUN 10 6 - 20 mg/dL   Creatinine, Ser 0.84 0.76 - 1.27 mg/dL   GFR calc non Af Amer 121 >59 mL/min/1.73   GFR calc Af Amer 140 >59 mL/min/1.73   BUN/Creatinine Ratio 12 9 - 20   Sodium 144 134 - 144 mmol/L   Potassium 4.3 3.5 - 5.2 mmol/L   Chloride 102 96 - 106 mmol/L   CO2 25 20 - 29 mmol/L   Calcium 9.9 8.7 - 10.2 mg/dL   Total Protein 6.7 6.0 - 8.5 g/dL   Albumin 4.5 3.5 - 5.5 g/dL   Globulin, Total 2.2 1.5 - 4.5 g/dL   Albumin/Globulin Ratio 2.0 1.2 - 2.2   Bilirubin Total 0.4 0.0 - 1.2 mg/dL   Alkaline Phosphatase 125 (H) 39 - 117 IU/L   AST 25 0 - 40 IU/L   ALT 34 0 - 44 IU/L  Lipid Panel w/o Chol/HDL Ratio  Result Value Ref Range   Cholesterol, Total 161 100 - 199 mg/dL   Triglycerides 238 (H) 0 - 149 mg/dL   HDL 34 (L) >39 mg/dL   VLDL Cholesterol Cal 48 (H) 5 - 40 mg/dL   LDL Calculated 79 0 - 99 mg/dL  TSH  Result Value Ref Range   TSH 1.760 0.450 - 4.500 uIU/mL  UA/M w/rflx Culture, Routine  Specimen: Urine   URINE  Result Value Ref Range   Specific Gravity, UA 1.020 1.005 - 1.030   pH, UA 7.5 5.0 - 7.5    Color, UA Yellow Yellow   Appearance Ur Clear Clear   Leukocytes, UA Negative Negative   Protein, UA Negative Negative/Trace   Glucose, UA Negative Negative   Ketones, UA Negative Negative   RBC, UA Negative Negative   Bilirubin, UA Negative Negative   Urobilinogen, Ur 0.2 0.2 - 1.0 mg/dL   Nitrite, UA Negative Negative      Assessment & Plan:   Problem List Items Addressed This Visit      Other   Depression    Not doing great. Having issues with motivation. Will get him back up on his zoloft and increase to 200mg , if not doing well, consider adding wellbutrin. Call with any concerns. Recheck 1 month.       Relevant Medications   sertraline (ZOLOFT) 100 MG tablet       Follow up plan: Return in about 4 weeks (around 12/30/2018) for virtual- follow up mood.   . This visit was completed via [employer] due to the restrictions of the COVID-19 pandemic. All issues as above were discussed and addressed. Physical exam was done as above through visual confirmation on D[employer]imity. If it was felt that the patient should be evaluated in the office, they were directed there. The patient verbally consented to this visit. . Location of the patient: home . Location of the provider: work . Those involved with this call:  . Provider: Park Liter, DO . CMA: Yvonna Alanis, [place] . Front Desk/Registration: Don Perking  . Time spent on call: 15 minutes with patient face to face via video conference. More than 50% of this time was spent in counseling and coordination of care. 23 minutes total spent in review of patient's record and preparation of their chart.

## 2018-12-02 NOTE — Assessment & Plan Note (Signed)
Not doing great. Having issues with motivation. Will get him back up on his zoloft and increase to 200mg , if not doing well, consider adding wellbutrin. Call with any concerns. Recheck 1 month.

## 2018-12-24 ENCOUNTER — Other Ambulatory Visit: Payer: Self-pay | Admitting: Family Medicine

## 2019-03-03 ENCOUNTER — Ambulatory Visit: Payer: BC Managed Care – PPO | Admitting: Family Medicine

## 2019-03-03 ENCOUNTER — Other Ambulatory Visit: Payer: Self-pay

## 2019-03-03 ENCOUNTER — Encounter: Payer: Self-pay | Admitting: Family Medicine

## 2019-03-03 VITALS — BP 138/90 | HR 88 | Temp 99.4°F | Ht 72.0 in | Wt 279.0 lb

## 2019-03-03 DIAGNOSIS — G4482 Headache associated with sexual activity: Secondary | ICD-10-CM | POA: Diagnosis not present

## 2019-03-03 DIAGNOSIS — Z23 Encounter for immunization: Secondary | ICD-10-CM | POA: Diagnosis not present

## 2019-03-03 DIAGNOSIS — F331 Major depressive disorder, recurrent, moderate: Secondary | ICD-10-CM

## 2019-03-03 DIAGNOSIS — M542 Cervicalgia: Secondary | ICD-10-CM

## 2019-03-03 DIAGNOSIS — Z114 Encounter for screening for human immunodeficiency virus [HIV]: Secondary | ICD-10-CM

## 2019-03-03 DIAGNOSIS — G4483 Primary cough headache: Secondary | ICD-10-CM

## 2019-03-03 MED ORDER — BUPROPION HCL ER (SR) 150 MG PO TB12: ORAL_TABLET | ORAL | 3 refills | Status: DC

## 2019-03-03 MED ORDER — CYCLOBENZAPRINE HCL 10 MG PO TABS: 10.0000 mg | ORAL_TABLET | Freq: Every day | ORAL | 0 refills | Status: DC

## 2019-03-03 MED ORDER — SERTRALINE HCL 100 MG PO TABS: 200.0000 mg | ORAL_TABLET | Freq: Every day | ORAL | 1 refills | Status: DC

## 2019-03-03 NOTE — Progress Notes (Signed)
BP 138/90   Pulse 88   Temp 99.4 F (37.4 C) (Oral)   Ht 6' (1.829 m)   Wt 279 lb (126.6 kg)   SpO2 97%   BMI 37.84 kg/m    Subjective:    Patient ID: Jacob [name], male    DOB: [birth year], 28 y.o.   MRN: [id number]  HPI: Jacob [name] is a 28 y.o. male  Chief Complaint  Patient presents with  . Depression    pt wants to discuss about sertraline  . Neck Pain    x 2 weeks. pt states pain starts on his neck and moves up to the head when pain gets worse   DEPRESSION- had been having issues with motivation and we increased his sertraline.  Mood status: uncontrolled Satisfied with current treatment?: no Symptom severity: moderate  Duration of current treatment : months Side effects: no Medication compliance: good compliance Psychotherapy/counseling: no  Previous psychiatric medications: sertraline Depressed mood: yes Anxious mood: yes Anhedonia: no Significant weight loss or gain: no Insomnia: no  Fatigue: yes Feelings of worthlessness or guilt: no Impaired concentration/indecisiveness: no Suicidal ideations: no Hopelessness: no Crying spells: no Depression screen [facility] 2/9 03/03/2019 12/02/2018 01/26/2018 08/04/2016  Decreased Interest 1 2 0 0  Down, Depressed, Hopeless 1 1 0 0  PHQ - 2 Score 2 3 0 0  Altered sleeping 0 1 1 0  Tired, decreased energy 3 3 1 2   Change in appetite 2 2 0 1  Feeling bad or failure about yourself  0 1 0 0  Trouble concentrating 3 1 0 0  Moving slowly or fidgety/restless 0 0 0 0  Suicidal thoughts 0 0 0 0  PHQ-9 Score 10 11 2 3   Difficult doing work/chores Very difficult - Not difficult at all -   GAD 7 : Generalized Anxiety Score 03/03/2019 12/02/2018 01/26/2018 08/04/2016  Nervous, Anxious, on Edge 2 0 0 1  Control/stop worrying 1 0 0 0  Worry too much - different things 2 0 0 0  Trouble relaxing 3 0 0 0  Restless 0 0 1 0  Easily annoyed or irritable 3 1 1 1   Afraid - awful might happen 2 0 0 0  Total GAD 7 Score 13 1 2 2   Anxiety  Difficulty Somewhat difficult Somewhat difficult Not difficult at all Not difficult at all    NECK PAIN- started about 2 weeks ago while he was having sex, had a shooting pain up from his neck that went into his head and had some trouble going away- came back about 2 days later, seems to be more pain in his neck that radiates into his head  Status: uncontrolled Treatments attempted: rest and ibuprofen  Relief with NSAIDs?:  moderate Location:Right Duration: 2 weeks off and on Severity: severe Quality: thunderclap and sharp Frequency: 2x in the last 2 weeks- seems to come on with sex Radiation: headache Aggravating factors: sex Alleviating factors: rest and NSAIDs Weakness:  no Paresthesias / decreased sensation:  no  Fevers:  no  Relevant past medical, surgical, family and social history reviewed and updated as indicated. Interim medical history since our last visit reviewed. Allergies and medications reviewed and updated.  Review of Systems  Constitutional: Negative.   Respiratory: Negative.   Cardiovascular: Negative.   Musculoskeletal: Positive for myalgias and neck pain. Negative for arthralgias, back pain, gait problem, joint swelling and neck stiffness.  Skin: Negative.   Neurological: Positive for headaches. Negative for dizziness, tremors, seizures, syncope, facial asymmetry,  speech difficulty, weakness, light-headedness and numbness.  Psychiatric/Behavioral: Positive for decreased concentration and dysphoric mood. Negative for agitation, behavioral problems, confusion, hallucinations, self-injury, sleep disturbance and suicidal ideas. The patient is nervous/anxious. The patient is not hyperactive.     Per HPI unless specifically indicated above     Objective:    BP 138/90   Pulse 88   Temp 99.4 F (37.4 C) (Oral)   Ht 6' (1.829 m)   Wt 279 lb (126.6 kg)   SpO2 97%   BMI 37.84 kg/m   Wt Readings from Last 3 Encounters:  03/03/19 279 lb (126.6 kg)  01/26/18 274  lb 6.4 oz (124.5 kg)  08/04/16 270 lb (122.5 kg)    Physical Exam Vitals signs and nursing note reviewed.  Constitutional:      General: He is not in acute distress.    Appearance: Normal appearance. He is not ill-appearing, toxic-appearing or diaphoretic.  HENT:     Head: Normocephalic and atraumatic.     Right Ear: External ear normal.     Left Ear: External ear normal.     Nose: Nose normal.     Mouth/Throat:     Mouth: Mucous membranes are moist.     Pharynx: Oropharynx is clear.  Eyes:     General: No scleral icterus.       Right eye: No discharge.        Left eye: No discharge.     Extraocular Movements: Extraocular movements intact.     Conjunctiva/sclera: Conjunctivae normal.     Pupils: Pupils are equal, round, and reactive to light.  Neck:     Musculoskeletal: Normal range of motion and neck supple.  Cardiovascular:     Rate and Rhythm: Normal rate and regular rhythm.     Pulses: Normal pulses.     Heart sounds: Normal heart sounds. No murmur. No friction rub. No gallop.   Pulmonary:     Effort: Pulmonary effort is normal. No respiratory distress.     Breath sounds: Normal breath sounds. No stridor. No wheezing, rhonchi or rales.  Chest:     Chest wall: No tenderness.  Musculoskeletal: Normal range of motion.        General: Tenderness (R trapezius insertion on back of the skull) present.  Skin:    General: Skin is warm and dry.     Capillary Refill: Capillary refill takes less than 2 seconds.     Coloration: Skin is not jaundiced or pale.     Findings: No bruising, erythema, lesion or rash.  Neurological:     General: No focal deficit present.     Mental Status: He is alert and oriented to person, place, and time. Mental status is at baseline.  Psychiatric:        Mood and Affect: Mood is depressed.        Behavior: Behavior normal.        Thought Content: Thought content normal.        Judgment: Judgment normal.     Results for orders placed or  performed in visit on 01/26/18  CBC with Differential/Platelet  Result Value Ref Range   WBC 6.6 3.4 - 10.8 x10E3/uL   RBC 5.03 4.14 - 5.80 x10E6/uL   Hemoglobin 15.4 13.0 - 17.7 g/dL   Hematocrit 43.7 37.5 - 51.0 %   MCV 87 79 - 97 fL   MCH 30.6 26.6 - 33.0 pg   MCHC 35.2 31.5 - 35.7 g/dL   RDW 12.3  12.3 - 15.4 %   Platelets 227 150 - 450 x10E3/uL   Neutrophils 68 Not Estab. %   Lymphs 22 Not Estab. %   Monocytes 7 Not Estab. %   Eos 2 Not Estab. %   Basos 0 Not Estab. %   Neutrophils Absolute 4.6 1.4 - 7.0 x10E3/uL   Lymphocytes Absolute 1.4 0.7 - 3.1 x10E3/uL   Monocytes Absolute 0.4 0.1 - 0.9 x10E3/uL   EOS (ABSOLUTE) 0.1 0.0 - 0.4 x10E3/uL   Basophils Absolute 0.0 0.0 - 0.2 x10E3/uL   Immature Granulocytes 1 Not Estab. %   Immature Grans (Abs) 0.0 0.0 - 0.1 x10E3/uL  Bayer DCA Hb A1c Waived  Result Value Ref Range   HB A1C (BAYER DCA - WAIVED) 4.9 <7.0 %  Comprehensive metabolic panel  Result Value Ref Range   Glucose 106 (H) 65 - 99 mg/dL   BUN 10 6 - 20 mg/dL   Creatinine, Ser 0.84 0.76 - 1.27 mg/dL   GFR calc non Af Amer 121 >59 mL/min/1.73   GFR calc Af Amer 140 >59 mL/min/1.73   BUN/Creatinine Ratio 12 9 - 20   Sodium 144 134 - 144 mmol/L   Potassium 4.3 3.5 - 5.2 mmol/L   Chloride 102 96 - 106 mmol/L   CO2 25 20 - 29 mmol/L   Calcium 9.9 8.7 - 10.2 mg/dL   Total Protein 6.7 6.0 - 8.5 g/dL   Albumin 4.5 3.5 - 5.5 g/dL   Globulin, Total 2.2 1.5 - 4.5 g/dL   Albumin/Globulin Ratio 2.0 1.2 - 2.2   Bilirubin Total 0.4 0.0 - 1.2 mg/dL   Alkaline Phosphatase 125 (H) 39 - 117 IU/L   AST 25 0 - 40 IU/L   ALT 34 0 - 44 IU/L  Lipid Panel w/o Chol/HDL Ratio  Result Value Ref Range   Cholesterol, Total 161 100 - 199 mg/dL   Triglycerides 238 (H) 0 - 149 mg/dL   HDL 34 (L) >39 mg/dL   VLDL Cholesterol Cal 48 (H) 5 - 40 mg/dL   LDL Calculated 79 0 - 99 mg/dL  TSH  Result Value Ref Range   TSH 1.760 0.450 - 4.500 uIU/mL  UA/M w/rflx Culture, Routine   Specimen:  Urine   URINE  Result Value Ref Range   Specific Gravity, UA 1.020 1.005 - 1.030   pH, UA 7.5 5.0 - 7.5   Color, UA Yellow Yellow   Appearance Ur Clear Clear   Leukocytes, UA Negative Negative   Protein, UA Negative Negative/Trace   Glucose, UA Negative Negative   Ketones, UA Negative Negative   RBC, UA Negative Negative   Bilirubin, UA Negative Negative   Urobilinogen, Ur 0.2 0.2 - 1.0 mg/dL   Nitrite, UA Negative Negative      Assessment & Plan:   Problem List Items Addressed This Visit      Other   Depression    Still not having motivation. Will continue sertraline and add wellbutrin. Recheck 1 month. Call with any concerns.       Relevant Medications   buPROPion (WELLBUTRIN SR) 150 MG 12 hr tablet   sertraline (ZOLOFT) 100 MG tablet    Other Visit Diagnoses    Headache associated with sexual activity    -  Primary   Last a few days ago- will obtain MRA to r/o subarachnoid hemorrhage vs aneurysm. Await results.    Relevant Medications   IBUPROFEN PO   buPROPion (WELLBUTRIN SR) 150 MG 12 hr tablet  cyclobenzaprine (FLEXERIL) 10 MG tablet   sertraline (ZOLOFT) 100 MG tablet   Other Relevant Orders   CBC with Differential/Platelet   Comprehensive metabolic panel   MR Angiogram Head Wo Contrast   Neck pain       Concern for tension headaches- will start exercises and muscle relaxer. Await results. Call with any concerns.    Screening for HIV (human immunodeficiency virus)       Labs drawn today. Await results.    Relevant Orders   HIV Antibody (routine testing w rflx)   Flu vaccine need       Relevant Orders   Flu Vaccine QUAD 36+ mos IM (Completed)   Primary cough headache       Relevant Medications   IBUPROFEN PO   buPROPion (WELLBUTRIN SR) 150 MG 12 hr tablet   cyclobenzaprine (FLEXERIL) 10 MG tablet   sertraline (ZOLOFT) 100 MG tablet   Other Relevant Orders   MR Angiogram Head Wo Contrast       Follow up plan: Return in about 4 weeks (around  03/31/2019) for follow up mood.

## 2019-03-03 NOTE — Assessment & Plan Note (Signed)
Still not having motivation. Will continue sertraline and add wellbutrin. Recheck 1 month. Call with any concerns.

## 2019-03-04 LAB — CBC WITH DIFFERENTIAL/PLATELET
Basophils Absolute: 0 10*3/uL (ref 0.0–0.2)
Basos: 0 %
EOS (ABSOLUTE): 0.2 10*3/uL (ref 0.0–0.4)
Eos: 2 %
Hematocrit: 45.8 % (ref 37.5–51.0)
Hemoglobin: 16.2 g/dL (ref 13.0–17.7)
Immature Grans (Abs): 0 10*3/uL (ref 0.0–0.1)
Immature Granulocytes: 0 %
Lymphocytes Absolute: 1.5 10*3/uL (ref 0.7–3.1)
Lymphs: 24 %
MCH: 30.5 pg (ref 26.6–33.0)
MCHC: 35.4 g/dL (ref 31.5–35.7)
MCV: 86 fL (ref 79–97)
Monocytes Absolute: 0.3 10*3/uL (ref 0.1–0.9)
Monocytes: 5 %
Neutrophils Absolute: 4.2 10*3/uL (ref 1.4–7.0)
Neutrophils: 69 %
Platelets: 208 10*3/uL (ref 150–450)
RBC: 5.31 x10E6/uL (ref 4.14–5.80)
RDW: 12.6 % (ref 11.6–15.4)
WBC: 6.2 10*3/uL (ref 3.4–10.8)

## 2019-03-04 LAB — COMPREHENSIVE METABOLIC PANEL
ALT: 36 IU/L (ref 0–44)
AST: 23 IU/L (ref 0–40)
Albumin/Globulin Ratio: 2.9 — ABNORMAL HIGH (ref 1.2–2.2)
Albumin: 4.9 g/dL (ref 4.1–5.2)
Alkaline Phosphatase: 126 IU/L — ABNORMAL HIGH (ref 39–117)
BUN/Creatinine Ratio: 14 (ref 9–20)
BUN: 13 mg/dL (ref 6–20)
Bilirubin Total: 0.5 mg/dL (ref 0.0–1.2)
CO2: 26 mmol/L (ref 20–29)
Calcium: 9.7 mg/dL (ref 8.7–10.2)
Chloride: 105 mmol/L (ref 96–106)
Creatinine, Ser: 0.96 mg/dL (ref 0.76–1.27)
GFR calc Af Amer: 124 mL/min/{1.73_m2} (ref >59)
GFR calc non Af Amer: 107 mL/min/{1.73_m2} (ref >59)
Globulin, Total: 1.7 g/dL (ref 1.5–4.5)
Glucose: 93 mg/dL (ref 65–99)
Potassium: 4.8 mmol/L (ref 3.5–5.2)
Sodium: 144 mmol/L (ref 134–144)
Total Protein: 6.6 g/dL (ref 6.0–8.5)

## 2019-03-04 LAB — HIV ANTIBODY (ROUTINE TESTING W REFLEX): HIV Screen 4th Generation wRfx: NONREACTIVE

## 2019-03-09 ENCOUNTER — Ambulatory Visit: Admission: RE | Admit: 2019-03-09 | Payer: BC Managed Care – PPO | Source: Ambulatory Visit

## 2019-03-26 ENCOUNTER — Other Ambulatory Visit: Payer: Self-pay | Admitting: Family Medicine

## 2019-03-27 NOTE — Telephone Encounter (Signed)
Requested medication (s) are due for refill today: yes  Requested medication (s) are on the active medication list:yes  Last refill:  03/04/2019  Future visit scheduled: yes  Notes to clinic:  Patient requesting a 90 day refill   Requested Prescriptions  Pending Prescriptions Disp Refills   buPROPion (WELLBUTRIN SR) 150 MG 12 hr tablet [Pharmacy Med Name: BUPROPION HCL SR 150 MG TABLET] 180 tablet 2    Sig: TAKE 1 TABLET DAILY FOR 1 WEEK, THEN INCREASE TO 1 TABLET BY MOUTH TWICE DAILY     Psychiatry: Antidepressants - bupropion Failed - 03/26/2019  9:32 AM      Failed - Last BP in normal range    BP Readings from Last 1 Encounters:  03/03/19 138/90         Passed - Valid encounter within last 6 months    Recent Outpatient Visits          3 weeks ago Headache associated with sexual activity   [place], Megan P, DO   3 months ago Moderate episode of recurrent major depressive disorder ([place])   [place], Megan P, DO   1 year ago Recurrent major depressive disorder, in full remission ([place])   [place], [place], DO   2 years ago [place], [place], DO      Future Appointments            In 4 days [place], Megan P, DO [place]           Passed - Completed PHQ-2 or PHQ-9 in the last 360 days.

## 2019-03-31 ENCOUNTER — Encounter: Payer: Self-pay | Admitting: Family Medicine

## 2019-03-31 ENCOUNTER — Ambulatory Visit (INDEPENDENT_AMBULATORY_CARE_PROVIDER_SITE_OTHER): Payer: BC Managed Care – PPO | Admitting: Family Medicine

## 2019-03-31 ENCOUNTER — Other Ambulatory Visit: Payer: Self-pay

## 2019-03-31 DIAGNOSIS — G4482 Headache associated with sexual activity: Secondary | ICD-10-CM | POA: Diagnosis not present

## 2019-03-31 DIAGNOSIS — F331 Major depressive disorder, recurrent, moderate: Secondary | ICD-10-CM

## 2019-03-31 MED ORDER — BUPROPION HCL ER (XL) 150 MG PO TB24: 150.0000 mg | ORAL_TABLET | Freq: Every day | ORAL | 3 refills | Status: DC

## 2019-03-31 NOTE — Assessment & Plan Note (Signed)
Doing a bit better, but having trouble with sleep. Will change to extended release wellbutrin and recheck 1 month. Call with any concerns. Continue to monitor.

## 2019-03-31 NOTE — Progress Notes (Signed)
There were no vitals taken for this visit.   Subjective:    Patient ID: Jacob [name], male    DOB: 1991-05-13, 28 y.o.   MRN: [id number]  HPI: Jacob [name] is a 28 y.o. male  Chief Complaint  Patient presents with  . Depression    4 week f/up    DEPRESSION- has been adjusting to the medication, not noticing any side effects- he is having some trouble sleeping, for the first week, he had some weird brain things, he says the second week, he was having some more energy, has had a lot of stress with work after that Mood status: better Satisfied with current treatment?: no Symptom severity: moderate  Duration of current treatment : 1 month Side effects: no Medication compliance: excellent compliance Psychotherapy/counseling: no  Previous psychiatric medications: zoloft, wellbutrin Depressed mood: yes Anxious mood: yes Anhedonia: no Significant weight loss or gain: no Insomnia: yes  Fatigue: yes Feelings of worthlessness or guilt: no Impaired concentration/indecisiveness: no Suicidal ideations: no Hopelessness: no Crying spells: no Depression screen [facility] 2/9 03/31/2019 03/03/2019 12/02/2018 01/26/2018 08/04/2016  Decreased Interest 0 1 2 0 0  Down, Depressed, Hopeless 1 1 1  0 0  PHQ - 2 Score 1 2 3  0 0  Altered sleeping 3 0 1 1 0  Tired, decreased energy 2 3 3 1 2   Change in appetite 0 2 2 0 1  Feeling bad or failure about yourself  0 0 1 0 0  Trouble concentrating 2 3 1  0 0  Moving slowly or fidgety/restless 0 0 0 0 0  Suicidal thoughts 0 0 0 0 0  PHQ-9 Score 8 10 11 2 3   Difficult doing work/chores Somewhat difficult Very difficult - Not difficult at all -   GAD 7 : Generalized Anxiety Score 03/31/2019 03/03/2019 12/02/2018 01/26/2018  Nervous, Anxious, on Edge 0 2 0 0  Control/stop worrying 0 1 0 0  Worry too much - different things 0 2 0 0  Trouble relaxing 0 3 0 0  Restless 1 0 0 1  Easily annoyed or irritable 2 3 1 1   Afraid - awful might happen 0 2 0 0  Total GAD 7 Score 3  13 1 2   Anxiety Difficulty Somewhat difficult Somewhat difficult Somewhat difficult Not difficult at all    Relevant past medical, surgical, family and social history reviewed and updated as indicated. Interim medical history since our last visit reviewed. Allergies and medications reviewed and updated.  Review of Systems  Constitutional: Negative.   Respiratory: Negative.   Cardiovascular: Negative.   Skin: Negative.   Neurological: Negative.   Psychiatric/Behavioral: Positive for dysphoric mood. Negative for agitation, behavioral problems, confusion, decreased concentration, hallucinations, self-injury, sleep disturbance and suicidal ideas. The patient is nervous/anxious. The patient is not hyperactive.     Per HPI unless specifically indicated above     Objective:    There were no vitals taken for this visit.  Wt Readings from Last 3 Encounters:  03/03/19 279 lb (126.6 kg)  01/26/18 274 lb 6.4 oz (124.5 kg)  08/04/16 270 lb (122.5 kg)    Physical Exam Vitals signs and nursing note reviewed.  Constitutional:      General: He is not in acute distress.    Appearance: Normal appearance. He is not ill-appearing, toxic-appearing or diaphoretic.  HENT:     Head: Normocephalic and atraumatic.     Right Ear: External ear normal.     Left Ear: External ear normal.  Nose: Nose normal.     Mouth/Throat:     Mouth: Mucous membranes are moist.     Pharynx: Oropharynx is clear.  Eyes:     General: No scleral icterus.       Right eye: No discharge.        Left eye: No discharge.     Conjunctiva/sclera: Conjunctivae normal.     Pupils: Pupils are equal, round, and reactive to light.  Neck:     Musculoskeletal: Normal range of motion.  Pulmonary:     Effort: Pulmonary effort is normal. No respiratory distress.     Comments: Speaking in full sentences Musculoskeletal: Normal range of motion.  Skin:    Coloration: Skin is not jaundiced or pale.     Findings: No bruising,  erythema, lesion or rash.  Neurological:     Mental Status: He is alert and oriented to person, place, and time. Mental status is at baseline.  Psychiatric:        Mood and Affect: Mood normal.        Behavior: Behavior normal.        Thought Content: Thought content normal.        Judgment: Judgment normal.     Results for orders placed or performed in visit on 03/03/19  CBC with Differential/Platelet  Result Value Ref Range   WBC 6.2 3.4 - 10.8 x10E3/uL   RBC 5.31 4.14 - 5.80 x10E6/uL   Hemoglobin 16.2 13.0 - 17.7 g/dL   Hematocrit 45.8 37.5 - 51.0 %   MCV 86 79 - 97 fL   MCH 30.5 26.6 - 33.0 pg   MCHC 35.4 31.5 - 35.7 g/dL   RDW 12.6 11.6 - 15.4 %   Platelets 208 150 - 450 x10E3/uL   Neutrophils 69 Not Estab. %   Lymphs 24 Not Estab. %   Monocytes 5 Not Estab. %   Eos 2 Not Estab. %   Basos 0 Not Estab. %   Neutrophils Absolute 4.2 1.4 - 7.0 x10E3/uL   Lymphocytes Absolute 1.5 0.7 - 3.1 x10E3/uL   Monocytes Absolute 0.3 0.1 - 0.9 x10E3/uL   EOS (ABSOLUTE) 0.2 0.0 - 0.4 x10E3/uL   Basophils Absolute 0.0 0.0 - 0.2 x10E3/uL   Immature Granulocytes 0 Not Estab. %   Immature Grans (Abs) 0.0 0.0 - 0.1 x10E3/uL  Comprehensive metabolic panel  Result Value Ref Range   Glucose 93 65 - 99 mg/dL   BUN 13 6 - 20 mg/dL   Creatinine, Ser 0.96 0.76 - 1.27 mg/dL   GFR calc non Af Amer 107 >59 mL/min/1.73   GFR calc Af Amer 124 >59 mL/min/1.73   BUN/Creatinine Ratio 14 9 - 20   Sodium 144 134 - 144 mmol/L   Potassium 4.8 3.5 - 5.2 mmol/L   Chloride 105 96 - 106 mmol/L   CO2 26 20 - 29 mmol/L   Calcium 9.7 8.7 - 10.2 mg/dL   Total Protein 6.6 6.0 - 8.5 g/dL   Albumin 4.9 4.1 - 5.2 g/dL   Globulin, Total 1.7 1.5 - 4.5 g/dL   Albumin/Globulin Ratio 2.9 (H) 1.2 - 2.2   Bilirubin Total 0.5 0.0 - 1.2 mg/dL   Alkaline Phosphatase 126 (H) 39 - 117 IU/L   AST 23 0 - 40 IU/L   ALT 36 0 - 44 IU/L  HIV Antibody (routine testing w rflx)  Result Value Ref Range   HIV Screen 4th  Generation wRfx Non Reactive Non Reactive  Assessment & Plan:   Problem List Items Addressed This Visit      Other   Depression    Doing a bit better, but having trouble with sleep. Will change to extended release wellbutrin and recheck 1 month. Call with any concerns. Continue to monitor.       Relevant Medications   buPROPion (WELLBUTRIN XL) 150 MG 24 hr tablet    Other Visit Diagnoses    Headache associated with sexual activity    -  Primary   Has resolved with muscle relaxer. Will hold on MRI for now- if this returns, we will get it rescheduled.    Relevant Medications   acetaminophen (TYLENOL) 650 MG CR tablet   buPROPion (WELLBUTRIN XL) 150 MG 24 hr tablet       Follow up plan: Return in about 4 weeks (around 04/28/2019).    . This visit was completed via [employer] due to the restrictions of the COVID-19 pandemic. All issues as above were discussed and addressed. Physical exam was done as above through visual confirmation on Doximity. If it was felt that the patient should be evaluated in the office, they were directed there. The patient verbally consented to this visit. . Location of the patient: home . Location of the provider: home . Those involved with this call:  . Provider: Park Liter, DO . CMA: Yvonna Alanis, [place] . Front Desk/Registration: Don Perking  . Time spent on call: 15 minutes with patient face to face via video conference. More than 50% of this time was spent in counseling and coordination of care. 23 minutes total spent in review of patient's record and preparation of their chart.

## 2019-04-26 ENCOUNTER — Other Ambulatory Visit: Payer: Self-pay | Admitting: Family Medicine

## 2019-04-26 NOTE — Telephone Encounter (Signed)
Requested medication (s) are due for refill today: no  Requested medication (s) are on the active medication list: yes  Last refill:  03/31/2019  Future visit scheduled: no  Notes to clinic:  Requesting a 90 day supply with 2 refills   Requested Prescriptions  Pending Prescriptions Disp Refills   buPROPion (WELLBUTRIN XL) 150 MG 24 hr tablet [Pharmacy Med Name: BUPROPION HCL XL 150 MG TABLET] 90 tablet 2    Sig: TAKE 1 [place]     Psychiatry: Antidepressants - bupropion Failed - 04/26/2019 11:31 AM      Failed - Last BP in normal range    BP Readings from Last 1 Encounters:  03/03/19 138/90         Passed - Valid encounter within last 6 months    Recent Outpatient Visits          3 weeks ago Headache associated with sexual activity   [place], Megan P, DO   1 month ago Headache associated with sexual activity   [place], Megan P, DO   4 months ago Moderate episode of recurrent major depressive disorder ([place])   [place], Megan P, DO   1 year ago Recurrent major depressive disorder, in full remission ([place])   [place], [place], DO   2 years ago [place], DO             Passed - Completed PHQ-2 or PHQ-9 in the last 360 days.

## 2019-05-03 ENCOUNTER — Encounter: Payer: Self-pay | Admitting: Family Medicine

## 2019-08-23 DIAGNOSIS — Z23 Encounter for immunization: Secondary | ICD-10-CM | POA: Diagnosis not present

## 2019-08-28 ENCOUNTER — Other Ambulatory Visit: Payer: Self-pay | Admitting: Family Medicine

## 2019-09-15 DIAGNOSIS — Z23 Encounter for immunization: Secondary | ICD-10-CM | POA: Diagnosis not present

## 2020-02-22 ENCOUNTER — Other Ambulatory Visit: Payer: Self-pay | Admitting: Family Medicine

## 2020-02-22 NOTE — Telephone Encounter (Signed)
Courtesy refill. Left pt. Message to call and make appointment.

## 2020-03-28 ENCOUNTER — Other Ambulatory Visit: Payer: Self-pay | Admitting: Family Medicine

## 2020-04-02 NOTE — Telephone Encounter (Signed)
Called pt to schedule, no answer, left vm 

## 2020-04-08 NOTE — Telephone Encounter (Signed)
Called pt to schedule, no answer, left vm 

## 2020-04-09 ENCOUNTER — Encounter: Payer: Self-pay | Admitting: Family Medicine

## 2020-04-09 NOTE — Telephone Encounter (Signed)
3RD Attempt Called pt to schedule, no answer, left vm.Sent letter.

## 2020-08-03 ENCOUNTER — Encounter: Payer: Self-pay | Admitting: Family Medicine

## 2020-08-04 ENCOUNTER — Telehealth: Payer: BC Managed Care – PPO | Admitting: Family

## 2020-08-04 ENCOUNTER — Encounter: Payer: Self-pay | Admitting: Family

## 2020-08-04 DIAGNOSIS — R1084 Generalized abdominal pain: Secondary | ICD-10-CM

## 2020-08-04 MED ORDER — ONDANSETRON HCL 4 MG PO TABS: 4.0000 mg | ORAL_TABLET | Freq: Three times a day (TID) | ORAL | 0 refills | Status: DC | PRN

## 2020-08-04 NOTE — Progress Notes (Signed)
Virtual Visit via telephone Note Due to COVID-19 pandemic this visit was conducted virtually. This visit type was conducted due to national recommendations for restrictions regarding the COVID-19 Pandemic (e.g. social distancing, sheltering in place) in an effort to limit this patient's exposure and mitigate transmission in our community. All issues noted in this document were discussed and addressed.  A physical exam was not performed with this format.  I connected with Jacob [name] on 08/04/20 at 7:51 pm  by telephone and verified that I am speaking with the correct person using two identifiers. Jacob [name] is currently located at home and friends  is currently with him  during visit. The provider, Evelina Dun, FNP is located in their office at time of visit.  I discussed the limitations, risks, security and privacy concerns of performing an evaluation and management service by telephone and the availability of in person appointments. I also discussed with the patient that there may be a patient responsible charge related to this service. The patient expressed understanding and agreed to proceed.   History and Present Illness:  Pt presents to the office today with abdominal pain that started three days. He thought this was related to his anxiety, but then started feeling decreased appetite, light headedness. He then thought it was constipation and took several laxatives. He had several loose BM's and his abdominal pain did not improve.   Denies any fever. Abdominal Pain This is a new problem. The current episode started in the past 7 days. The problem has been waxing and waning. The pain is located in the generalized abdominal region. The pain is at a severity of 3/10. The quality of the pain is dull and cramping. Associated symptoms include belching, constipation and nausea. Pertinent negatives include no dysuria, flatus, headaches, hematuria, myalgias or vomiting. The pain is relieved by  nothing. Treatments tried: baking soda. The treatment provided mild relief.      Review of Systems  Gastrointestinal: Positive for abdominal pain, constipation and nausea. Negative for flatus and vomiting.  Genitourinary: Negative for dysuria and hematuria.  Musculoskeletal: Negative for myalgias.  Neurological: Negative for headaches.  All other systems reviewed and are negative.    Observations/Objective: No SOB or distress noted, patient pressed on four areas of abdominal area without pain. Temp-98 F.  Assessment and Plan: 1. Generalized abdominal pain Force fluids Bland diet Zofran as needed Follow up with PCP in next 1-2 days if symptoms continue. More than likely viral. Restart PPI.  - ondansetron (ZOFRAN) 4 MG tablet; Take 1 tablet (4 mg total) by mouth every 8 (eight) hours as needed for nausea or vomiting.  Dispense: 30 tablet; Refill: 0     I discussed the assessment and treatment plan with the patient. The patient was provided an opportunity to ask questions and all were answered. The patient agreed with the plan and demonstrated an understanding of the instructions.   The patient was advised to call back or seek an in-person evaluation if the symptoms worsen or if the condition fails to improve as anticipated.  The above assessment and management plan was discussed with the patient. The patient verbalized understanding of and has agreed to the management plan. Patient is aware to call the clinic if symptoms persist or worsen. Patient is aware when to return to the clinic for a follow-up visit. Patient educated on when it is appropriate to go to the emergency department.   Time call ended:  8:08 pm  I provided 17 minutes of  non-face-to-face time during this encounter.    Evelina Dun, FNP

## 2020-08-22 ENCOUNTER — Encounter: Payer: Self-pay | Admitting: Family Medicine

## 2020-08-22 ENCOUNTER — Other Ambulatory Visit: Payer: Self-pay

## 2020-08-22 ENCOUNTER — Ambulatory Visit: Payer: BC Managed Care – PPO | Admitting: Family Medicine

## 2020-08-22 VITALS — BP 140/94 | HR 94 | Temp 98.0°F | Wt 273.2 lb

## 2020-08-22 DIAGNOSIS — F331 Major depressive disorder, recurrent, moderate: Secondary | ICD-10-CM

## 2020-08-22 DIAGNOSIS — Z13220 Encounter for screening for lipoid disorders: Secondary | ICD-10-CM | POA: Diagnosis not present

## 2020-08-22 DIAGNOSIS — K5900 Constipation, unspecified: Secondary | ICD-10-CM | POA: Diagnosis not present

## 2020-08-22 DIAGNOSIS — L918 Other hypertrophic disorders of the skin: Secondary | ICD-10-CM

## 2020-08-22 DIAGNOSIS — K30 Functional dyspepsia: Secondary | ICD-10-CM

## 2020-08-22 LAB — URINALYSIS, ROUTINE W REFLEX MICROSCOPIC
Bilirubin, UA: NEGATIVE
Glucose, UA: NEGATIVE
Ketones, UA: NEGATIVE
Leukocytes,UA: NEGATIVE
Nitrite, UA: NEGATIVE
RBC, UA: NEGATIVE
Specific Gravity, UA: 1.015 (ref 1.005–1.030)
Urobilinogen, Ur: 1 mg/dL (ref 0.2–1.0)
pH, UA: 9 — ABNORMAL HIGH (ref 5.0–7.5)

## 2020-08-22 MED ORDER — POLYETHYLENE GLYCOL 3350 17 GM/SCOOP PO POWD: 17.0000 g | Freq: Two times a day (BID) | ORAL | 1 refills | Status: DC | PRN

## 2020-08-22 MED ORDER — OMEPRAZOLE 20 MG PO CPDR: 20.0000 mg | DELAYED_RELEASE_CAPSULE | Freq: Every day | ORAL | 3 refills | Status: DC

## 2020-08-22 MED ORDER — BUPROPION HCL ER (SR) 150 MG PO TB12: ORAL_TABLET | ORAL | 0 refills | Status: DC

## 2020-08-22 NOTE — Patient Instructions (Signed)
Gastritis, Adult Gastritis is inflammation of the stomach. There are two kinds of gastritis:  Acute gastritis. This kind develops suddenly.  Chronic gastritis. This kind is much more common and lasts for a long time. Gastritis happens when the lining of the stomach becomes weak or gets damaged. Without treatment, gastritis can lead to stomach bleeding and ulcers. What are the causes? This condition may be caused by:  An infection.  Drinking too much alcohol.  Certain medicines. These include steroids, antibiotics, and some over-the-counter medicines, such as aspirin or ibuprofen.  Having too much acid in the stomach.  A disease of the intestines or stomach.  Stress.  An allergic reaction.  Crohn's disease.  Some cancer treatments (radiation). Sometimes the cause of this condition is not known. What are the signs or symptoms? Symptoms of this condition include:  Pain or a burning sensation in the upper abdomen.  Nausea.  Vomiting.  An uncomfortable feeling of fullness after eating.  Weight loss.  Bad breath.  Blood in your vomit or stools. In some cases, there are no symptoms. How is this diagnosed? This condition may be diagnosed with:  Your medical history and a description of your symptoms.  A physical exam.  Tests. These can include: ? Blood tests. ? Stool tests. ? A test in which a thin, flexible instrument with a light and a camera is passed down the esophagus and into the stomach (upper endoscopy). ? A test in which a sample of tissue is taken for testing (biopsy). How is this treated? This condition may be treated with medicines. The medicines that are used vary depending on the cause of the gastritis:  If the condition is caused by a bacterial infection, you may be given antibiotic medicines.  If the condition is caused by too much acid in the stomach, you may be given medicines called H2 blockers, proton pump inhibitors, or antacids. Treatment  may also involve stopping the use of certain medicines, such as aspirin, ibuprofen, or other NSAIDs. Follow these instructions at home: Medicines  Take over-the-counter and prescription medicines only as told by your health care provider.  If you were prescribed an antibiotic medicine, take it as told by your health care provider. Do not stop taking the antibiotic even if you start to feel better. Eating and drinking  Eat small, frequent meals instead of large meals.  Avoid foods and drinks that make your symptoms worse.  Drink enough fluid to keep your urine pale yellow.   Alcohol use  Do not drink alcohol if: ? Your health care provider tells you not to drink. ? You are pregnant, may be pregnant, or are planning to become pregnant.  If you drink alcohol: ? Limit your use to:  0-1 drink a day for women.  0-2 drinks a day for men. ? Be aware of how much alcohol is in your drink. In the [place] one drink equals one 12 oz bottle of beer (355 mL), one 5 oz glass of wine (148 mL), or one 1 oz glass of hard liquor (44 mL). General instructions  Talk with your health care provider about ways to manage stress, such as getting regular exercise or practicing deep breathing, meditation, or yoga.  Do not use any products that contain nicotine or tobacco, such as cigarettes and e-cigarettes. If you need help quitting, ask your health care provider.  Keep all follow-up visits as told by your health care provider. This is important. Contact a health care provider if:  Your symptoms get worse.  Your symptoms return after treatment. Get help right away if:  You vomit blood or material that looks like coffee grounds.  You have black or dark red stools.  You are unable to keep fluids down.  Your abdominal pain gets worse.  You have a fever.  You do not feel better after one week. Summary  Gastritis is inflammation of the lining of the stomach that can occur suddenly (acute) or  develop slowly over time (chronic).  This condition is diagnosed with a medical history, a physical exam, or tests.  This condition may be treated with medicines to treat infection or medicines to reduce the amount of acid in your stomach.  Follow your health care provider's instructions about taking medicines, making changes to your diet, and knowing when to call for help. This information is not intended to replace advice given to you by your health care provider. Make sure you discuss any questions you have with your health care provider. Document Revised: 09/28/2017 Document Reviewed: 09/28/2017 Elsevier Patient Education  [place].

## 2020-08-22 NOTE — Assessment & Plan Note (Signed)
Not feeling well. Blunted on sertraline. Will start him on wellbutrin and recheck 2-3 weeks. Call with any concerns. Checking labs to look for organic cause. Continue to monitor.

## 2020-08-22 NOTE — Progress Notes (Signed)
BP (!) 140/94   Pulse 94   Temp 98 F (36.7 C)   Wt 273 lb 3.2 oz (123.9 kg)   SpO2 97%   BMI 37.05 kg/m    Subjective:    Patient ID: Jacob [name], male    DOB: 12-06-1990, 30 y.o.   MRN: [id number]  HPI: Jacob [name] is a 30 y.o. male  Chief Complaint  Patient presents with  . Depression    Patient states he is having little motivation to do things.  . Anxiety    Patient states his anxiety has been worse lately since he's been off his medications  . GI Problem    Patient states for about 4 weeks he has been having nausea every day. Patient states he is feeling constipated as well.   . skin concern    Patient states he has a growth on his upper back, states it has been there for months. Believes it is getting bigger.    DEPRESSION Mood status: exacerbated Satisfied with current treatment?: no Symptom severity: moderate  Duration of current treatment : had been on meds for years, came off about 6 months ago Side effects: yes- blunted emotions Medication compliance: has been off meds for about 6 months Psychotherapy/counseling: no  Previous psychiatric medications: wellbutrin, sertraline. celexa (good for about 2 weeks only then bad side effects) Depressed mood: yes Anxious mood: yes Anhedonia: no Significant weight loss or gain: no Insomnia: no  Fatigue: yes Feelings of worthlessness or guilt: yes Impaired concentration/indecisiveness: yes Suicidal ideations: no Hopelessness: no Crying spells: no Depression screen [facility] 2/9 08/22/2020 03/31/2019 03/03/2019 12/02/2018 01/26/2018  Decreased Interest 3 0 1 2 0  Down, Depressed, Hopeless 3 1 1 1  0  PHQ - 2 Score 6 1 2 3  0  Altered sleeping 0 3 0 1 1  Tired, decreased energy 3 2 3 3 1   Change in appetite 3 0 2 2 0  Feeling bad or failure about yourself  2 0 0 1 0  Trouble concentrating 2 2 3 1  0  Moving slowly or fidgety/restless 1 0 0 0 0  Suicidal thoughts 1 0 0 0 0  PHQ-9 Score 18 8 10 11 2   Difficult doing work/chores  Somewhat difficult Somewhat difficult Very difficult - Not difficult at all   Has been having some GI upset with a general feeling of unease in his belly. He notes that he will feel sick and having indigestion occasionally with eating. He will constantly feel like he has a nervous stomach. A little constipation. No diarrhea. No vomiting. He saw a teledoc and was given zofran which helped a little. He feels like he needs to strain to get his stools out. He is concerned that there is some sort of blockage, he is going 1x at least a day, and it is formed. He has not noticed that it is looking different.   He also has a spot on his back he'd like looked at. No other concerns or complaints at this time.   Relevant past medical, surgical, family and social history reviewed and updated as indicated. Interim medical history since our last visit reviewed. Allergies and medications reviewed and updated.  Review of Systems  Constitutional: Negative.   Respiratory: Negative.   Cardiovascular: Negative.   Gastrointestinal: Positive for abdominal pain and constipation. Negative for abdominal distention, anal bleeding, blood in stool, diarrhea, nausea, rectal pain and vomiting.  Musculoskeletal: Negative.   Skin: Negative.   Neurological: Negative.   Psychiatric/Behavioral:  Positive for decreased concentration and dysphoric mood. Negative for agitation, behavioral problems, confusion, hallucinations, self-injury, sleep disturbance and suicidal ideas. The patient is nervous/anxious. The patient is not hyperactive.     Per HPI unless specifically indicated above     Objective:    BP (!) 140/94   Pulse 94   Temp 98 F (36.7 C)   Wt 273 lb 3.2 oz (123.9 kg)   SpO2 97%   BMI 37.05 kg/m   Wt Readings from Last 3 Encounters:  08/22/20 273 lb 3.2 oz (123.9 kg)  03/03/19 279 lb (126.6 kg)  01/26/18 274 lb 6.4 oz (124.5 kg)    Physical Exam Vitals and nursing note reviewed.  Constitutional:       General: He is not in acute distress.    Appearance: Normal appearance. He is obese. He is not ill-appearing, toxic-appearing or diaphoretic.  HENT:     Head: Normocephalic and atraumatic.     Right Ear: External ear normal.     Left Ear: External ear normal.     Nose: Nose normal.     Mouth/Throat:     Mouth: Mucous membranes are moist.     Pharynx: Oropharynx is clear.  Eyes:     General: No scleral icterus.       Right eye: No discharge.        Left eye: No discharge.     Extraocular Movements: Extraocular movements intact.     Conjunctiva/sclera: Conjunctivae normal.     Pupils: Pupils are equal, round, and reactive to light.  Cardiovascular:     Rate and Rhythm: Normal rate and regular rhythm.     Pulses: Normal pulses.     Heart sounds: Normal heart sounds. No murmur heard. No friction rub. No gallop.   Pulmonary:     Effort: Pulmonary effort is normal. No respiratory distress.     Breath sounds: Normal breath sounds. No stridor. No wheezing, rhonchi or rales.  Chest:     Chest wall: No tenderness.  Musculoskeletal:        General: Normal range of motion.     Cervical back: Normal range of motion and neck supple.  Skin:    General: Skin is warm and dry.     Capillary Refill: Capillary refill takes less than 2 seconds.     Coloration: Skin is not jaundiced or pale.     Findings: No bruising, erythema, lesion or rash.     Comments: Skin tag on L side of his middle back  Neurological:     General: No focal deficit present.     Mental Status: He is alert and oriented to person, place, and time. Mental status is at baseline.  Psychiatric:        Mood and Affect: Mood normal.        Behavior: Behavior normal.        Thought Content: Thought content normal.        Judgment: Judgment normal.     Results for orders placed or performed in visit on 03/03/19  CBC with Differential/Platelet  Result Value Ref Range   WBC 6.2 3.4 - 10.8 x10E3/uL   RBC 5.31 4.14 - 5.80  x10E6/uL   Hemoglobin 16.2 13.0 - 17.7 g/dL   Hematocrit 45.8 37.5 - 51.0 %   MCV 86 79 - 97 fL   MCH 30.5 26.6 - 33.0 pg   MCHC 35.4 31.5 - 35.7 g/dL   RDW 12.6 11.6 - 15.4 %  Platelets 208 150 - 450 x10E3/uL   Neutrophils 69 Not Estab. %   Lymphs 24 Not Estab. %   Monocytes 5 Not Estab. %   Eos 2 Not Estab. %   Basos 0 Not Estab. %   Neutrophils Absolute 4.2 1.4 - 7.0 x10E3/uL   Lymphocytes Absolute 1.5 0.7 - 3.1 x10E3/uL   Monocytes Absolute 0.3 0.1 - 0.9 x10E3/uL   EOS (ABSOLUTE) 0.2 0.0 - 0.4 x10E3/uL   Basophils Absolute 0.0 0.0 - 0.2 x10E3/uL   Immature Granulocytes 0 Not Estab. %   Immature Grans (Abs) 0.0 0.0 - 0.1 x10E3/uL  Comprehensive metabolic panel  Result Value Ref Range   Glucose 93 65 - 99 mg/dL   BUN 13 6 - 20 mg/dL   Creatinine, Ser 0.96 0.76 - 1.27 mg/dL   GFR calc non Af Amer 107 >59 mL/min/1.73   GFR calc Af Amer 124 >59 mL/min/1.73   BUN/Creatinine Ratio 14 9 - 20   Sodium 144 134 - 144 mmol/L   Potassium 4.8 3.5 - 5.2 mmol/L   Chloride 105 96 - 106 mmol/L   CO2 26 20 - 29 mmol/L   Calcium 9.7 8.7 - 10.2 mg/dL   Total Protein 6.6 6.0 - 8.5 g/dL   Albumin 4.9 4.1 - 5.2 g/dL   Globulin, Total 1.7 1.5 - 4.5 g/dL   Albumin/Globulin Ratio 2.9 (H) 1.2 - 2.2   Bilirubin Total 0.5 0.0 - 1.2 mg/dL   Alkaline Phosphatase 126 (H) 39 - 117 IU/L   AST 23 0 - 40 IU/L   ALT 36 0 - 44 IU/L  HIV Antibody (routine testing w rflx)  Result Value Ref Range   HIV Screen 4th Generation wRfx Non Reactive Non Reactive      Assessment & Plan:   Problem List Items Addressed This Visit      Other   Depression - Primary    Not feeling well. Blunted on sertraline. Will start him on wellbutrin and recheck 2-3 weeks. Call with any concerns. Checking labs to look for organic cause. Continue to monitor.       Relevant Medications   buPROPion (WELLBUTRIN SR) 150 MG 12 hr tablet   Other Relevant Orders   CBC with Differential/Platelet   Comprehensive metabolic panel    TSH   VITAMIN D 25 Hydroxy (Vit-D Deficiency, Fractures)    Other Visit Diagnoses    Indigestion       ?anxiety related. Will start him on omeprazole and recheck 2-3 weeks. Call with any concerns.    Relevant Orders   CBC with Differential/Platelet   Comprehensive metabolic panel   TSH   Urinalysis, Routine w reflex microscopic   Constipation, unspecified constipation type       Will treat indigestion and get him cleaned out with miralax. Recheck 2-3 weeks, if not better, consider AXR.   Skin tag       Reassured patient. Call with any concerns.    Screening for cholesterol level       Labs drawn today. Await results.    Relevant Orders   Lipid Panel w/o Chol/HDL Ratio       Follow up plan: Return 2-3 weeks.

## 2020-08-23 LAB — CBC WITH DIFFERENTIAL/PLATELET
Basophils Absolute: 0 10*3/uL (ref 0.0–0.2)
Basos: 0 %
EOS (ABSOLUTE): 0 10*3/uL (ref 0.0–0.4)
Eos: 1 %
Hematocrit: 49.8 % (ref 37.5–51.0)
Hemoglobin: 16.9 g/dL (ref 13.0–17.7)
Immature Grans (Abs): 0 10*3/uL (ref 0.0–0.1)
Immature Granulocytes: 0 %
Lymphocytes Absolute: 1.3 10*3/uL (ref 0.7–3.1)
Lymphs: 20 %
MCH: 30.4 pg (ref 26.6–33.0)
MCHC: 33.9 g/dL (ref 31.5–35.7)
MCV: 90 fL (ref 79–97)
Monocytes Absolute: 0.4 10*3/uL (ref 0.1–0.9)
Monocytes: 6 %
Neutrophils Absolute: 4.6 10*3/uL (ref 1.4–7.0)
Neutrophils: 73 %
Platelets: 215 10*3/uL (ref 150–450)
RBC: 5.56 x10E6/uL (ref 4.14–5.80)
RDW: 12.3 % (ref 11.6–15.4)
WBC: 6.3 10*3/uL (ref 3.4–10.8)

## 2020-08-23 LAB — COMPREHENSIVE METABOLIC PANEL
ALT: 75 IU/L — ABNORMAL HIGH (ref 0–44)
AST: 35 IU/L (ref 0–40)
Albumin/Globulin Ratio: 2.8 — ABNORMAL HIGH (ref 1.2–2.2)
Albumin: 5.1 g/dL (ref 4.1–5.2)
Alkaline Phosphatase: 109 IU/L (ref 44–121)
BUN/Creatinine Ratio: 10 (ref 9–20)
BUN: 10 mg/dL (ref 6–20)
Bilirubin Total: 0.9 mg/dL (ref 0.0–1.2)
CO2: 26 mmol/L (ref 20–29)
Calcium: 10.1 mg/dL (ref 8.7–10.2)
Chloride: 100 mmol/L (ref 96–106)
Creatinine, Ser: 1.03 mg/dL (ref 0.76–1.27)
Globulin, Total: 1.8 g/dL (ref 1.5–4.5)
Glucose: 86 mg/dL (ref 65–99)
Potassium: 4.1 mmol/L (ref 3.5–5.2)
Sodium: 143 mmol/L (ref 134–144)
Total Protein: 6.9 g/dL (ref 6.0–8.5)
eGFR: 101 mL/min/{1.73_m2} (ref >59)

## 2020-08-23 LAB — VITAMIN D 25 HYDROXY (VIT D DEFICIENCY, FRACTURES): Vit D, 25-Hydroxy: 21 ng/mL — ABNORMAL LOW (ref 30.0–100.0)

## 2020-08-23 LAB — TSH: TSH: 1.31 u[IU]/mL (ref 0.450–4.500)

## 2020-08-23 LAB — LIPID PANEL W/O CHOL/HDL RATIO
Cholesterol, Total: 172 mg/dL (ref 100–199)
HDL: 34 mg/dL — ABNORMAL LOW (ref >39)
LDL Chol Calc (NIH): 110 mg/dL — ABNORMAL HIGH (ref 0–99)
Triglycerides: 156 mg/dL — ABNORMAL HIGH (ref 0–149)
VLDL Cholesterol Cal: 28 mg/dL (ref 5–40)

## 2020-08-27 ENCOUNTER — Encounter: Payer: Self-pay | Admitting: Family Medicine

## 2020-08-27 DIAGNOSIS — K5900 Constipation, unspecified: Secondary | ICD-10-CM

## 2020-08-28 ENCOUNTER — Encounter: Payer: Self-pay | Admitting: *Deleted

## 2020-08-30 ENCOUNTER — Other Ambulatory Visit: Payer: Self-pay

## 2020-08-30 ENCOUNTER — Ambulatory Visit: Payer: BC Managed Care – PPO | Admitting: Gastroenterology

## 2020-08-30 ENCOUNTER — Encounter: Payer: Self-pay | Admitting: Gastroenterology

## 2020-08-30 ENCOUNTER — Encounter: Payer: Self-pay | Admitting: Family Medicine

## 2020-08-30 VITALS — BP 161/112 | HR 112 | Temp 98.7°F | Ht 72.0 in | Wt 268.1 lb

## 2020-08-30 DIAGNOSIS — R7989 Other specified abnormal findings of blood chemistry: Secondary | ICD-10-CM

## 2020-08-30 DIAGNOSIS — K219 Gastro-esophageal reflux disease without esophagitis: Secondary | ICD-10-CM

## 2020-08-30 DIAGNOSIS — R194 Change in bowel habit: Secondary | ICD-10-CM

## 2020-08-30 DIAGNOSIS — K5900 Constipation, unspecified: Secondary | ICD-10-CM

## 2020-08-30 NOTE — Progress Notes (Signed)
Cephas Darby, MD [address]  [place]  [place], [place] [address]  Main: [phone]  Fax: [phone]    Gastroenterology Consultation  Referring Provider:     Valerie Roys, DO Primary Care Physician:  Valerie Roys, DO Primary Gastroenterologist:  Dr. Cephas Darby Reason for Consultation:     Constipation, chronic GERD, elevated LFTs        HPI:   Jacob [name] is a 30 y.o. male referred by Dr. Wynetta Emery, Barb Merino, DO  for consultation & management of constipation.  Patient reports approximately 1 month history of constipation, incomplete emptying and sensation of fullness in the rectum.  He was originally seen by his PCP on 3/31, he was recommended to take MiraLAX 2 times daily for 2 days which resulted in bowel movement, however he felt some fullness in the rectum about 1 to 2 inches inside.  He switched his diet from high carb to high-fiber, pretty much eating fruits and vegetables every day that has resulted in bowel movements daily which are soft.  Patient denies any rectal bleeding, abdominal pain, abdominal bloating  He has been experiencing chronic heartburn, has been on omeprazole 20 mg for few years.  He denies any other GI symptoms.  He is found to have mildly elevated LFTs since 2019  He does not smoke or drink alcohol He is being treated for depression.  He is not physically active.  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 Anxiety   . Depression     History reviewed. No pertinent surgical history.  Current Outpatient Medications:  .  buPROPion (WELLBUTRIN SR) 150 MG 12 hr tablet, Take 1 pill in the AM for 1 week, then increase to 1 pill 2x a day, Disp: 60 tablet, Rfl: 0 .  omeprazole (PRILOSEC) 20 MG capsule, Take 1  capsule (20 mg total) by mouth daily., Disp: 30 capsule, Rfl: 3 .  ondansetron (ZOFRAN) 4 MG tablet, Take 1 tablet (4 mg total) by mouth every 8 (eight) hours as needed for nausea or vomiting., Disp: 30 tablet, Rfl: 0   Family History  Problem Relation Age of Onset  . Autoimmune disease Father   . Autoimmune disease Sister   . Autoimmune disease Brother      Social History   Tobacco Use  . Smoking status: Never Smoker  . Smokeless tobacco: Never Used  Vaping Use  . Vaping Use: Some days  Substance Use Topics  . Alcohol use: Yes    Comment: Socially  . Drug use: Never    Allergies as of 08/30/2020  . (No Known Allergies)    Review of Systems:    All systems reviewed and negative except where noted in HPI.   Physical Exam:  BP (!) 161/112 (BP Location: Left Arm, Patient Position: Sitting, Cuff Size: Large)   Pulse (!) 112   Temp 98.7 F (37.1 C) (Oral)   Ht 6' (1.829 m)   Wt 268 lb 2 oz (121.6 kg)   BMI 36.36 kg/m  No LMP for male patient.  General:   Alert,  Well-developed, well-nourished, pleasant and cooperative in NAD Head:  Normocephalic and atraumatic. Eyes:  Sclera clear, no icterus.   Conjunctiva pink. Ears:  Normal auditory acuity. Nose:  No deformity,  discharge, or lesions. Mouth:  No deformity or lesions,oropharynx pink & moist. Neck:  Supple; no masses or thyromegaly. Lungs:  Respirations even and unlabored.  Clear throughout to auscultation.   No wheezes, crackles, or rhonchi. No acute distress. Heart:  Regular rate and rhythm; no murmurs, clicks, rubs, or gallops. Abdomen:  Normal bowel sounds. Soft, non-tender and non-distended without masses, hepatosplenomegaly or hernias noted.  No guarding or rebound tenderness.   Rectal: Not performed Msk:  Symmetrical without gross deformities. Good, equal movement & strength bilaterally. Pulses:  Normal pulses noted. Extremities:  No clubbing or edema.  No cyanosis. Neurologic:  Alert and oriented x3;   grossly normal neurologically. Skin:  Intact without significant lesions or rashes. No jaundice. Psych:  Alert and cooperative. Normal mood and affect.  Imaging Studies: None  Assessment and Plan:   Jacob [name] is a 30 y.o. male with BMI 36, history of depression is seen in consultation for new onset of constipation, chronic GERD and abnormal LFTs.  Normal TSH  Chronic constipation and fullness in the rectum Recommend a bottle of magnesium citrate for cleanout Reiterated to continue high-fiber diet and adequate intake of water Okay to take MiraLAX as needed Recommend flexible sigmoidoscopy  Chronic GERD Recommend upper endoscopy for Barrett's screening Discussed about antireflux lifestyle, information provided Continue close 20 mg daily for now  Abnormal LFTs, most likely secondary to fatty liver Recommend right upper quadrant ultrasound Will need to check viral hepatitis panel   Follow up in 2 to 3 months   Cephas Darby, MD

## 2020-08-30 NOTE — Patient Instructions (Addendum)
1.  Take a bottle of magnesium citrate over the weekend 2.  Continue to follow high-fiber diet 3. Your RUQ ultrasound is scheduled on 10/15/2020 at [facility] at 8:00am and arrive at 7:45am.  Nothing to eat or drink after midnight,    Please call our office to speak with my nurse Caryl Pina  [phone] during business hours from 8am to 4pm if you have any questions/concerns. During after hours, you will be redirected to on call GI physician. For any emergency please call 911 or go the nearest emergency room.    Cephas Darby, MD [address]  [place], [place] [address]  Main: [phone]  Fax: [phone]      Fatty Liver Disease  The liver converts food into energy, removes toxic material from the blood, makes important proteins, and absorbs necessary vitamins from food. Fatty liver disease occurs when too much fat has built up in your liver cells. Fatty liver disease is also called hepatic steatosis. In many cases, fatty liver disease does not cause symptoms or problems. It is often diagnosed when tests are being done for other reasons. However, over time, fatty liver can cause inflammation that may lead to more serious liver problems, such as scarring of the liver (cirrhosis) and liver failure. Fatty liver is associated with insulin resistance, increased body fat, high blood pressure (hypertension), and high cholesterol. These are features of metabolic syndrome and increase your risk for stroke, diabetes, and heart disease. What are the causes? This condition may be caused by components of metabolic syndrome:  Obesity.  Insulin resistance.  High cholesterol. Other causes:  Alcohol abuse.  Poor nutrition.  Cushing syndrome.  Pregnancy.  Certain drugs.  Poisons.  Some viral infections. What increases the risk? You are more likely to develop this condition if you:  Abuse alcohol.  Are overweight.  Have diabetes.  Have hepatitis.  Have a high  triglyceride level.  Are pregnant. What are the signs or symptoms? Fatty liver disease often does not cause symptoms. If symptoms do develop, they can include:  Fatigue and weakness.  Weight loss.  Confusion.  Nausea, vomiting, or abdominal pain.  Yellowing of your skin and the white parts of your eyes (jaundice).  Itchy skin. How is this diagnosed? This condition may be diagnosed by:  A physical exam and your medical history.  Blood tests.  Imaging tests, such as an ultrasound, CT scan, or MRI.  A liver biopsy. A small sample of liver tissue is removed using a needle. The sample is then looked at under a microscope. How is this treated? Fatty liver disease is often caused by other health conditions. Treatment for fatty liver may involve medicines and lifestyle changes to manage conditions such as:  Alcoholism.  High cholesterol.  Diabetes.  Being overweight or obese. Follow these instructions at home:  Do not drink alcohol. If you have trouble quitting, ask your health care provider how to safely quit with the help of medicine or a supervised program. This is important to keep your condition from getting worse.  Eat a healthy diet as told by your health care provider. Ask your health care provider about working with a dietitian to develop an eating plan.  Exercise regularly. This can help you lose weight and control your cholesterol and diabetes. Talk to your health care provider about an exercise plan and which activities are best for you.  Take over-the-counter and prescription medicines only as told by your health care provider.  Keep all follow-up visits. This is important.   Contact a health care provider if:  You have trouble controlling your: ? Blood sugar. This is especially important if you have diabetes. ? Cholesterol. ? Drinking of alcohol. Get help right away if:  You have abdominal pain.  You have jaundice.  You have nausea and are  vomiting.  You vomit blood or material that looks like coffee grounds.  You have stools that are black, tar-like, or bloody. Summary  Fatty liver disease develops when too much fat builds up in the cells of your liver.  Fatty liver disease often causes no symptoms or problems. However, over time, fatty liver can cause inflammation that may lead to more serious liver problems, such as scarring of the liver (cirrhosis).  You are more likely to develop this condition if you abuse alcohol, are pregnant, are overweight, have diabetes, have hepatitis, or have high triglyceride or cholesterol levels.  Contact your health care provider if you have trouble controlling your blood sugar, cholesterol, or drinking of alcohol. This information is not intended to replace advice given to you by your health care provider. Make sure you discuss any questions you have with your health care provider. Document Revised: 02/22/2020 Document Reviewed: 02/22/2020 Elsevier Patient Education  2021 [place] for Gastroesophageal Reflux Disease, Adult When you have gastroesophageal reflux disease (GERD), the foods you eat and your eating habits are very important. Choosing the right foods can help ease your discomfort. Think about working with a food expert (dietitian) to help you make good choices. What are tips for following this plan? Reading food labels  Look for foods that are low in saturated fat. Foods that may help with your symptoms include: ? Foods that have less than 5% of daily value (DV) of fat. ? Foods that have 0 grams of trans fat. Cooking  Do not fry your food.  Cook your food by baking, steaming, grilling, or broiling. These are all methods that do not need a lot of fat for cooking.  To add flavor, try to use herbs that are low in spice and acidity. Meal planning  Choose healthy foods that are low in fat, such as: ? Fruits and vegetables. ? Whole grains. ? Low-fat dairy  products. ? Lean meats, fish, and poultry.  Eat small meals often instead of eating 3 large meals each day. Eat your meals slowly in a place where you are relaxed. Avoid bending over or lying down until 2-3 hours after eating.  Limit high-fat foods such as fatty meats or fried foods.  Limit your intake of fatty foods, such as oils, butter, and shortening.  Avoid the following as told by your doctor: ? Foods that cause symptoms. These may be different for different people. Keep a food diary to keep track of foods that cause symptoms. ? Alcohol. ? Drinking a lot of liquid with meals. ? Eating meals during the 2-3 hours before bed.   Lifestyle  Stay at a healthy weight. Ask your doctor what weight is healthy for you. If you need to lose weight, work with your doctor to do so safely.  Exercise for at least 30 minutes on 5 or more days each week, or as told by your doctor.  Wear loose-fitting clothes.  Do not smoke or use any products that contain nicotine or tobacco. If you need help quitting, ask your doctor.  Sleep with the head of your bed higher than your feet. Use a wedge under  the mattress or blocks under the bed frame to raise the head of the bed.  Chew sugar-free gum after meals. What foods should eat? Eat a healthy, well-balanced diet of fruits, vegetables, whole grains, low-fat dairy products, lean meats, fish, and poultry. Each person is different. Foods that may cause symptoms in one person may not cause any symptoms in another person. Work with your doctor to find foods that are safe for you. The items listed above may not be a complete list of what you can eat and drink. Contact a food expert for more options.   What foods should I avoid? Limiting some of these foods may help in managing the symptoms of GERD. Everyone is different. Talk with a food expert or your doctor to help you find the exact foods to avoid, if any. Fruits Any fruits prepared with added fat. Any fruits  that cause symptoms. For some people, this may include citrus fruits, such as oranges, grapefruit, pineapple, and lemons. Vegetables Deep-fried vegetables. [place] fries. Any vegetables prepared with added fat. Any vegetables that cause symptoms. For some people, this may include tomatoes and tomato products, chili peppers, onions and garlic, and horseradish. Grains Pastries or quick breads with added fat. Meats and other proteins High-fat meats, such as fatty beef or pork, hot dogs, ribs, ham, sausage, salami, and bacon. Fried meat or protein, including fried fish and fried chicken. Nuts and nut butters, in large amounts. Dairy Whole milk and chocolate milk. Sour cream. Cream. Ice cream. Cream cheese. Milkshakes. Fats and oils Butter. Margarine. Shortening. Ghee. Beverages Coffee and tea, with or without caffeine. Carbonated beverages. Sodas. Energy drinks. Fruit juice made with acidic fruits, such as orange or grapefruit. Tomato juice. Alcoholic drinks. Sweets and desserts Chocolate and cocoa. Donuts. Seasonings and condiments Pepper. Peppermint and spearmint. Added salt. Any condiments, herbs, or seasonings that cause symptoms. For some people, this may include curry, hot sauce, or vinegar-based salad dressings. The items listed above may not be a complete list of what you should not eat and drink. Contact a food expert for more options. Questions to ask your doctor Diet and lifestyle changes are often the first steps that are taken to manage symptoms of GERD. If diet and lifestyle changes do not help, talk with your doctor about taking medicines. Where to find more information  International Foundation for Gastrointestinal Disorders: aboutgerd.org Summary  When you have GERD, food and lifestyle choices are very important in easing your symptoms.  Eat small meals often instead of 3 large meals a day. Eat your meals slowly and in a place where you are relaxed.  Avoid bending over or  lying down until 2-3 hours after eating.  Limit high-fat foods such as fatty meats or fried foods. This information is not intended to replace advice given to you by your health care provider. Make sure you discuss any questions you have with your health care provider. Document Revised: 11/20/2019 Document Reviewed: 11/20/2019 Elsevier Patient Education  [place]. High-Fiber Eating Plan Fiber, also called dietary fiber, is a type of carbohydrate. It is found foods such as fruits, vegetables, whole grains, and beans. A high-fiber diet can have many health benefits. Your health care provider may recommend a high-fiber diet to help:  Prevent constipation. Fiber can make your bowel movements more regular.  Lower your cholesterol.  Relieve the following conditions: ? Inflammation of veins in the anus (hemorrhoids). ? Inflammation of specific areas of the digestive tract (uncomplicated diverticulosis). ? A problem  of the large intestine, also called the colon, that sometimes causes pain and diarrhea (irritable bowel syndrome, or IBS).  Prevent overeating as part of a weight-loss plan.  Prevent heart disease, type 2 diabetes, and certain cancers. What are tips for following this plan? Reading food labels  Check the nutrition facts label on food products for the amount of dietary fiber. Choose foods that have 5 grams of fiber or more per serving.  The goals for recommended daily fiber intake include: ? Men (age 14 or younger): 34-38 g. ? Men (over age 67): 28-34 g. ? Women (age 69 or younger): 25-28 g. ? Women (over age 28): 22-25 g. Your daily fiber goal is _____________ g.   Shopping  Choose whole fruits and vegetables instead of processed forms, such as apple juice or applesauce.  Choose a wide variety of high-fiber foods such as avocados, lentils, oats, and kidney beans.  Read the nutrition facts label of the foods you choose. Be aware of foods with added fiber. These  foods often have high sugar and sodium amounts per serving. Cooking  Use whole-grain flour for baking and cooking.  Cook with brown rice instead of white rice. Meal planning  Start the day with a breakfast that is high in fiber, such as a cereal that contains 5 g of fiber or more per serving.  Eat breads and cereals that are made with whole-grain flour instead of refined flour or white flour.  Eat brown rice, bulgur wheat, or millet instead of white rice.  Use beans in place of meat in soups, salads, and pasta dishes.  Be sure that half of the grains you eat each day are whole grains. General information  You can get the recommended daily intake of dietary fiber by: ? Eating a variety of fruits, vegetables, grains, nuts, and beans. ? Taking a fiber supplement if you are not able to take in enough fiber in your diet. It is better to get fiber through food than from a supplement.  Gradually increase how much fiber you consume. If you increase your intake of dietary fiber too quickly, you may have bloating, cramping, or gas.  Drink plenty of water to help you digest fiber.  Choose high-fiber snacks, such as berries, raw vegetables, nuts, and popcorn. What foods should I eat? Fruits Berries. Pears. Apples. Oranges. Avocado. Prunes and raisins. Dried figs. Vegetables Sweet potatoes. Spinach. Kale. Artichokes. Cabbage. Broccoli. Cauliflower. Green peas. Carrots. Squash. Grains Whole-grain breads. Multigrain cereal. Oats and oatmeal. Brown rice. Barley. Bulgur wheat. [place]. Quinoa. Bran muffins. Popcorn. Rye wafer crackers. Meats and other proteins [place] beans, kidney beans, and pinto beans. Soybeans. Split peas. Lentils. Nuts and seeds. Dairy Fiber-fortified yogurt. Beverages Fiber-fortified soy milk. Fiber-fortified orange juice. Other foods Fiber bars. The items listed above may not be a complete list of recommended foods and beverages. Contact a dietitian for more  information. What foods should I avoid? Fruits Fruit juice. Cooked, strained fruit. Vegetables Fried potatoes. Canned vegetables. Well-cooked vegetables. Grains White bread. Pasta made with refined flour. White rice. Meats and other proteins Fatty cuts of meat. Fried chicken or fried fish. Dairy Milk. Yogurt. Cream cheese. Sour cream. Fats and oils Butters. Beverages Soft drinks. Other foods Cakes and pastries. The items listed above may not be a complete list of foods and beverages to avoid. Talk with your dietitian about what choices are best for you. Summary  Fiber is a type of carbohydrate. It is found in foods such as fruits, vegetables,  whole grains, and beans.  A high-fiber diet has many benefits. It can help to prevent constipation, lower blood cholesterol, aid weight loss, and reduce your risk of heart disease, diabetes, and certain cancers.  Increase your intake of fiber gradually. Increasing fiber too quickly may cause cramping, bloating, and gas. Drink plenty of water while you increase the amount of fiber you consume.  The best sources of fiber include whole fruits and vegetables, whole grains, nuts, seeds, and beans. This information is not intended to replace advice given to you by your health care provider. Make sure you discuss any questions you have with your health care provider. Document Revised: 09/14/2019 Document Reviewed: 09/14/2019 Elsevier Patient Education  2021 [employer].

## 2020-09-10 ENCOUNTER — Other Ambulatory Visit: Payer: Self-pay

## 2020-09-10 ENCOUNTER — Ambulatory Visit: Payer: BC Managed Care – PPO | Admitting: Family Medicine

## 2020-09-10 ENCOUNTER — Encounter: Payer: Self-pay | Admitting: Gastroenterology

## 2020-09-10 ENCOUNTER — Encounter: Payer: Self-pay | Admitting: Family Medicine

## 2020-09-10 VITALS — BP 145/96 | HR 77 | Temp 98.2°F | Wt 261.8 lb

## 2020-09-10 DIAGNOSIS — K5900 Constipation, unspecified: Secondary | ICD-10-CM | POA: Diagnosis not present

## 2020-09-10 DIAGNOSIS — F411 Generalized anxiety disorder: Secondary | ICD-10-CM | POA: Diagnosis not present

## 2020-09-10 DIAGNOSIS — F331 Major depressive disorder, recurrent, moderate: Secondary | ICD-10-CM

## 2020-09-10 MED ORDER — ESCITALOPRAM OXALATE 5 MG PO TABS: 2.5000 mg | ORAL_TABLET | Freq: Every day | ORAL | 1 refills | Status: DC

## 2020-09-10 NOTE — Assessment & Plan Note (Signed)
Did not tolerate wellbutrin. Will try lexapro. Call with any concerns. Continue to monitor. Recheck 2-3 weeks.

## 2020-09-10 NOTE — Progress Notes (Signed)
BP (!) 145/96   Pulse 77   Temp 98.2 F (36.8 C)   Wt 261 lb 12.8 oz (118.8 kg)   SpO2 98%   BMI 35.51 kg/m    Subjective:    Patient ID: Jacob [name], male    DOB: Apr 17, 1991, 30 y.o.   MRN: [id number]  HPI: Jacob [name] is a 30 y.o. male  Chief Complaint  Patient presents with  . Depression  . Anxiety  . Constipation   Saw Dr. Marius Ditch on 4/8 for his constipation/rectal fullness- she thought it was likely constipation. Recommended a clean out with mag citrate and then to have a flex sig to make sure everything was OK. This is scheduled for tomorrow. They are also doing a EGD to screen for Barrett's due to GERD.  He notes that there has been no change in his symptoms.   ANXIETY/STRESS- stopped taking his wellbutrin as he was having some side effects from it, and felt like  Duration: chronic Status:uncontrolled Anxious mood: yes  Excessive worrying: yes Irritability: no  Sweating: no Nausea: no Palpitations:yes Hyperventilation: no Panic attacks: yes Agoraphobia: no  Obscessions/compulsions: no Depressed mood: yes Depression screen [facility] 2/9 09/10/2020 08/22/2020 03/31/2019 03/03/2019 12/02/2018  Decreased Interest 1 3 0 1 2  Down, Depressed, Hopeless 1 3 1 1 1   PHQ - 2 Score 2 6 1 2 3   Altered sleeping 3 0 3 0 1  Tired, decreased energy 1 3 2 3 3   Change in appetite 3 3 0 2 2  Feeling bad or failure about yourself  1 2 0 0 1  Trouble concentrating 2 2 2 3 1   Moving slowly or fidgety/restless 0 1 0 0 0  Suicidal thoughts 0 1 0 0 0  PHQ-9 Score 12 18 8 10 11   Difficult doing work/chores Somewhat difficult Somewhat difficult Somewhat difficult Very difficult -   GAD 7 : Generalized Anxiety Score 09/10/2020 08/22/2020 03/31/2019 03/03/2019  Nervous, Anxious, on Edge 1 3 0 2  Control/stop worrying 2 2 0 1  Worry too much - different things 1 3 0 2  Trouble relaxing 1 3 0 3  Restless 1 0 1 0  Easily annoyed or irritable 0 1 2 3   Afraid - awful might happen 1 1 0 2  Total GAD 7  Score 7 13 3 13   Anxiety Difficulty Somewhat difficult Somewhat difficult Somewhat difficult Somewhat difficult   Anhedonia: no Weight changes: yes Insomnia: no   Hypersomnia: no Fatigue/loss of energy: yes Feelings of worthlessness: no Feelings of guilt: no Impaired concentration/indecisiveness: no Suicidal ideations: no  Crying spells: no Recent Stressors/Life Changes: yes   Relationship problems: yes   Family stress: no     Financial stress: yes    Job stress: no    Recent death/loss: no   Relevant past medical, surgical, family and social history reviewed and updated as indicated. Interim medical history since our last visit reviewed. Allergies and medications reviewed and updated.  Review of Systems  Constitutional: Negative.   Respiratory: Negative.   Cardiovascular: Negative.   Gastrointestinal: Positive for constipation and rectal pain. Negative for abdominal distention, abdominal pain, anal bleeding, blood in stool, diarrhea, nausea and vomiting.  Musculoskeletal: Negative.   Skin: Negative.   Psychiatric/Behavioral: Positive for dysphoric mood. Negative for agitation, behavioral problems, confusion, decreased concentration, hallucinations, self-injury, sleep disturbance and suicidal ideas. The patient is nervous/anxious. The patient is not hyperactive.     Per HPI unless specifically indicated above  Objective:    BP (!) 145/96   Pulse 77   Temp 98.2 F (36.8 C)   Wt 261 lb 12.8 oz (118.8 kg)   SpO2 98%   BMI 35.51 kg/m   Wt Readings from Last 3 Encounters:  09/10/20 261 lb 12.8 oz (118.8 kg)  08/30/20 268 lb 2 oz (121.6 kg)  08/22/20 273 lb 3.2 oz (123.9 kg)    Physical Exam Vitals and nursing note reviewed.  Constitutional:      General: He is not in acute distress.    Appearance: Normal appearance. He is not ill-appearing, toxic-appearing or diaphoretic.  HENT:     Head: Normocephalic and atraumatic.     Right Ear: External ear normal.      Left Ear: External ear normal.     Nose: Nose normal.     Mouth/Throat:     Mouth: Mucous membranes are moist.     Pharynx: Oropharynx is clear.  Eyes:     General: No scleral icterus.       Right eye: No discharge.        Left eye: No discharge.     Extraocular Movements: Extraocular movements intact.     Conjunctiva/sclera: Conjunctivae normal.     Pupils: Pupils are equal, round, and reactive to light.  Cardiovascular:     Rate and Rhythm: Normal rate and regular rhythm.     Pulses: Normal pulses.     Heart sounds: Normal heart sounds. No murmur heard. No friction rub. No gallop.   Pulmonary:     Effort: Pulmonary effort is normal. No respiratory distress.     Breath sounds: Normal breath sounds. No stridor. No wheezing, rhonchi or rales.  Chest:     Chest wall: No tenderness.  Musculoskeletal:        General: Normal range of motion.     Cervical back: Normal range of motion and neck supple.  Skin:    General: Skin is warm and dry.     Capillary Refill: Capillary refill takes less than 2 seconds.     Coloration: Skin is not jaundiced or pale.     Findings: No bruising, erythema, lesion or rash.  Neurological:     General: No focal deficit present.     Mental Status: He is alert and oriented to person, place, and time. Mental status is at baseline.  Psychiatric:        Mood and Affect: Mood is anxious and depressed.        Behavior: Behavior normal.        Thought Content: Thought content normal.        Judgment: Judgment normal.     Results for orders placed or performed in visit on 08/22/20  CBC with Differential/Platelet  Result Value Ref Range   WBC 6.3 3.4 - 10.8 x10E3/uL   RBC 5.56 4.14 - 5.80 x10E6/uL   Hemoglobin 16.9 13.0 - 17.7 g/dL   Hematocrit 49.8 37.5 - 51.0 %   MCV 90 79 - 97 fL   MCH 30.4 26.6 - 33.0 pg   MCHC 33.9 31.5 - 35.7 g/dL   RDW 12.3 11.6 - 15.4 %   Platelets 215 150 - 450 x10E3/uL   Neutrophils 73 Not Estab. %   Lymphs 20 Not Estab. %    Monocytes 6 Not Estab. %   Eos 1 Not Estab. %   Basos 0 Not Estab. %   Neutrophils Absolute 4.6 1.4 - 7.0 x10E3/uL   Lymphocytes Absolute  1.3 0.7 - 3.1 x10E3/uL   Monocytes Absolute 0.4 0.1 - 0.9 x10E3/uL   EOS (ABSOLUTE) 0.0 0.0 - 0.4 x10E3/uL   Basophils Absolute 0.0 0.0 - 0.2 x10E3/uL   Immature Granulocytes 0 Not Estab. %   Immature Grans (Abs) 0.0 0.0 - 0.1 x10E3/uL  Comprehensive metabolic panel  Result Value Ref Range   Glucose 86 65 - 99 mg/dL   BUN 10 6 - 20 mg/dL   Creatinine, Ser 1.03 0.76 - 1.27 mg/dL   eGFR 101 >59 mL/min/1.73   BUN/Creatinine Ratio 10 9 - 20   Sodium 143 134 - 144 mmol/L   Potassium 4.1 3.5 - 5.2 mmol/L   Chloride 100 96 - 106 mmol/L   CO2 26 20 - 29 mmol/L   Calcium 10.1 8.7 - 10.2 mg/dL   Total Protein 6.9 6.0 - 8.5 g/dL   Albumin 5.1 4.1 - 5.2 g/dL   Globulin, Total 1.8 1.5 - 4.5 g/dL   Albumin/Globulin Ratio 2.8 (H) 1.2 - 2.2   Bilirubin Total 0.9 0.0 - 1.2 mg/dL   Alkaline Phosphatase 109 44 - 121 IU/L   AST 35 0 - 40 IU/L   ALT 75 (H) 0 - 44 IU/L  Lipid Panel w/o Chol/HDL Ratio  Result Value Ref Range   Cholesterol, Total 172 100 - 199 mg/dL   Triglycerides 156 (H) 0 - 149 mg/dL   HDL 34 (L) >39 mg/dL   VLDL Cholesterol Cal 28 5 - 40 mg/dL   LDL Chol Calc (NIH) 110 (H) 0 - 99 mg/dL  TSH  Result Value Ref Range   TSH 1.310 0.450 - 4.500 uIU/mL  Urinalysis, Routine w reflex microscopic  Result Value Ref Range   Specific Gravity, UA 1.015 1.005 - 1.030   pH, UA 9.0 (H) 5.0 - 7.5   Color, UA Yellow Yellow   Appearance Ur Cloudy (A) Clear   Leukocytes,UA Negative Negative   Protein,UA Trace (A) Negative/Trace   Glucose, UA Negative Negative   Ketones, UA Negative Negative   RBC, UA Negative Negative   Bilirubin, UA Negative Negative   Urobilinogen, Ur 1.0 0.2 - 1.0 mg/dL   Nitrite, UA Negative Negative  VITAMIN D 25 Hydroxy (Vit-D Deficiency, Fractures)  Result Value Ref Range   Vit D, 25-Hydroxy 21.0 (L) 30.0 - 100.0 ng/mL       Assessment & Plan:   Problem List Items Addressed This Visit      Other   Depression - Primary    Did not tolerate wellbutrin. Will try lexapro. Call with any concerns. Continue to monitor. Recheck 2-3 weeks.       Relevant Medications   escitalopram (LEXAPRO) 5 MG tablet   GAD (generalized anxiety disorder)    Did not tolerate wellbutrin. Will try lexapro. Call with any concerns. Continue to monitor. Recheck 2-3 weeks.       Relevant Medications   escitalopram (LEXAPRO) 5 MG tablet    Other Visit Diagnoses    Constipation, unspecified constipation type       Having flex sig tomorrow. Await results. Continue to follow with GI.       Follow up plan: Return 2-4 weeks.

## 2020-09-11 ENCOUNTER — Encounter: Payer: Self-pay | Admitting: Gastroenterology

## 2020-09-11 ENCOUNTER — Ambulatory Visit: Payer: BC Managed Care – PPO | Admitting: Registered Nurse

## 2020-09-11 ENCOUNTER — Encounter
Admission: RE | Disposition: A | Discharge status: Home / Self Care | Payer: Self-pay | Source: Home / Self Care | Attending: Gastroenterology

## 2020-09-11 ENCOUNTER — Ambulatory Visit
Admission: RE | Admit: 2020-09-11 | Discharge: 2020-09-11 | Disposition: A | Discharge status: Home / Self Care | Payer: BC Managed Care – PPO | Attending: Gastroenterology | Admitting: Gastroenterology

## 2020-09-11 DIAGNOSIS — K5140 Inflammatory polyps of colon without complications: Secondary | ICD-10-CM | POA: Insufficient documentation

## 2020-09-11 DIAGNOSIS — K621 Rectal polyp: Secondary | ICD-10-CM | POA: Insufficient documentation

## 2020-09-11 DIAGNOSIS — R194 Change in bowel habit: Secondary | ICD-10-CM | POA: Insufficient documentation

## 2020-09-11 DIAGNOSIS — K635 Polyp of colon: Secondary | ICD-10-CM | POA: Diagnosis not present

## 2020-09-11 DIAGNOSIS — Z79899 Other long term (current) drug therapy: Secondary | ICD-10-CM | POA: Diagnosis not present

## 2020-09-11 DIAGNOSIS — K644 Residual hemorrhoidal skin tags: Secondary | ICD-10-CM | POA: Diagnosis not present

## 2020-09-11 DIAGNOSIS — D128 Benign neoplasm of rectum: Secondary | ICD-10-CM | POA: Diagnosis not present

## 2020-09-11 DIAGNOSIS — K219 Gastro-esophageal reflux disease without esophagitis: Secondary | ICD-10-CM

## 2020-09-11 DIAGNOSIS — K2100 Gastro-esophageal reflux disease with esophagitis, without bleeding: Secondary | ICD-10-CM | POA: Diagnosis not present

## 2020-09-11 DIAGNOSIS — R198 Other specified symptoms and signs involving the digestive system and abdomen: Secondary | ICD-10-CM

## 2020-09-11 DIAGNOSIS — E669 Obesity, unspecified: Secondary | ICD-10-CM

## 2020-09-11 DIAGNOSIS — K6289 Other specified diseases of anus and rectum: Secondary | ICD-10-CM | POA: Insufficient documentation

## 2020-09-11 HISTORY — PX: ESOPHAGOGASTRODUODENOSCOPY (EGD) WITH PROPOFOL: SHX5813

## 2020-09-11 HISTORY — PX: FLEXIBLE SIGMOIDOSCOPY: SHX5431

## 2020-09-11 SURGERY — SIGMOIDOSCOPY, FLEXIBLE
Anesthesia: General

## 2020-09-11 MED ORDER — DEXMEDETOMIDINE HCL 200 MCG/2ML IV SOLN
INTRAVENOUS | Status: DC | PRN
  Administered 2020-09-11: 20 ug via INTRAVENOUS

## 2020-09-11 MED ORDER — PROPOFOL 10 MG/ML IV BOLUS
INTRAVENOUS | Status: DC | PRN
  Administered 2020-09-11: 50 mg via INTRAVENOUS
  Administered 2020-09-11: 100 mg via INTRAVENOUS

## 2020-09-11 MED ORDER — LIDOCAINE HCL (CARDIAC) PF 100 MG/5ML IV SOSY
PREFILLED_SYRINGE | INTRAVENOUS | Status: DC | PRN
  Administered 2020-09-11: 100 mg via INTRAVENOUS

## 2020-09-11 MED ORDER — PROPOFOL 500 MG/50ML IV EMUL
INTRAVENOUS | Status: DC | PRN
  Administered 2020-09-11: 150 ug/kg/min via INTRAVENOUS

## 2020-09-11 MED ORDER — PROPOFOL 10 MG/ML IV BOLUS
INTRAVENOUS | Status: AC
  Filled 2020-09-11: qty 20

## 2020-09-11 MED ORDER — SODIUM CHLORIDE 0.9 % IV SOLN: INTRAVENOUS | Status: DC

## 2020-09-11 MED ORDER — PROPOFOL 500 MG/50ML IV EMUL
INTRAVENOUS | Status: AC
  Filled 2020-09-11: qty 50

## 2020-09-11 NOTE — Op Note (Signed)
[facility] Patient Name: Jacob [name] Procedure Date: 09/11/2020 11:29 AM MRN: [id number] Account #: [id number] Date of Birth: Jun 24, 1990 Admit Type: Outpatient Age: 30 Room: [facility] ENDO ROOM 4 Gender: Male Note Status: Finalized Procedure:             Upper GI endoscopy Indications:           Screening procedure, Screening for Barrett's esophagus                         in patient at risk for this condition, Follow-up of                         gastro-esophageal reflux disease Providers:             Lin Landsman MD, MD Medicines:             General Anesthesia Complications:         No immediate complications. Estimated blood loss: None. Procedure:             Pre-Anesthesia Assessment:                        - Prior to the procedure, a History and Physical was                         performed, and patient medications and allergies were                         reviewed. The patient is competent. The risks and                         benefits of the procedure and the sedation options and                         risks were discussed with the patient. All questions                         were answered and informed consent was obtained.                         Patient identification and proposed procedure were                         verified by the physician, the nurse, the                         anesthesiologist, the anesthetist and the technician                         in the pre-procedure area in the procedure room in the                         endoscopy suite. Mental Status Examination: alert and                         oriented. Airway Examination: normal oropharyngeal                         airway and neck mobility.  Respiratory Examination:                         clear to auscultation. CV Examination: normal.                         Prophylactic Antibiotics: The patient does not require                         prophylactic  antibiotics. Prior Anticoagulants: The                         patient has taken no previous anticoagulant or                         antiplatelet agents. ASA Grade Assessment: II - A                         patient with mild systemic disease. After reviewing                         the risks and benefits, the patient was deemed in                         satisfactory condition to undergo the procedure. The                         anesthesia plan was to use general anesthesia.                         Immediately prior to administration of medications,                         the patient was re-assessed for adequacy to receive                         sedatives. The heart rate, respiratory rate, oxygen                         saturations, blood pressure, adequacy of pulmonary                         ventilation, and response to care were monitored                         throughout the procedure. The physical status of the                         patient was re-assessed after the procedure.                        After obtaining informed consent, the endoscope was                         passed under direct vision. Throughout the procedure,                         the patient's blood pressure, pulse, and oxygen  saturations were monitored continuously. The Endoscope                         was introduced through the mouth, and advanced to the                         second part of duodenum. The upper GI endoscopy was                         accomplished without difficulty. The patient tolerated                         the procedure well. Findings:      The duodenal bulb and second portion of the duodenum were normal.      The entire examined stomach was normal.      The cardia and gastric fundus were normal on retroflexion.      Esophagogastric landmarks were identified: the gastroesophageal junction       was found at 35 cm from the incisors.      The  gastroesophageal junction and examined esophagus were normal.       Biopsies were taken with a cold forceps for histology. Impression:            - Normal duodenal bulb and second portion of the                         duodenum.                        - Normal stomach.                        - Esophagogastric landmarks identified.                        - Normal gastroesophageal junction and esophagus.                         Biopsied. Recommendation:        - Await pathology results. Procedure Code(s):     --- Professional ---                        [phone], Esophagogastroduodenoscopy, flexible,                         transoral; with biopsy, single or multiple Diagnosis Code(s):     --- Professional ---                        Z13.810, Encounter for screening for upper                         gastrointestinal disorder                        K21.9, Gastro-esophageal reflux disease without                         esophagitis CPT copyright 2019 American Medical Association. All rights reserved. The codes documented in this report are preliminary and upon coder review may  be revised to meet  current compliance requirements. Dr. Ulyess Mort Lin Landsman MD, MD 09/11/2020 11:52:42 AM This report has been signed electronically. Number of Addenda: 0 Note Initiated On: 09/11/2020 11:29 AM Estimated Blood Loss:  Estimated blood loss: none.      [facility]

## 2020-09-11 NOTE — Transfer of Care (Signed)
Immediate Anesthesia Transfer of Care Note  Patient: Jacob [name]  Procedure(s) Performed: FLEXIBLE SIGMOIDOSCOPY (N/A ) ESOPHAGOGASTRODUODENOSCOPY (EGD) WITH PROPOFOL (N/A )  Patient Location: PACU  Anesthesia Type:MAC  Level of Consciousness: drowsy  Airway & Oxygen Therapy: Patient Spontanous Breathing  Post-op Assessment: Report given to RN and Post -op Vital signs reviewed and stable  Post vital signs: Reviewed and stable  Last Vitals:  Vitals Value Taken Time  BP 103/51 09/11/20 1211  Temp    Pulse 83 09/11/20 1212  Resp 18 09/11/20 1212  SpO2 97 % 09/11/20 1212  Vitals shown include unvalidated device data.  Last Pain:  Vitals:   09/11/20 1210  TempSrc:   PainSc: Asleep         Complications: No complications documented.

## 2020-09-11 NOTE — Op Note (Signed)
[facility] Gastroenterology Patient Name: Jacob [name] Procedure Date: 09/11/2020 11:29 AM MRN: [id number] Account #: [id number] Date of Birth: 1991/01/25 Admit Type: Outpatient Age: 30 Room: [facility] ENDO ROOM 4 Gender: Male Note Status: Finalized Procedure:             Flexible Sigmoidoscopy Indications:           Rectal pain, fullness in rectum Providers:             Lin Landsman MD, MD Medicines:             General Anesthesia Complications:         No immediate complications. Estimated blood loss: None. Procedure:             Pre-Anesthesia Assessment:                        - Prior to the procedure, a History and Physical was                         performed, and patient medications and allergies were                         reviewed. The patient is competent. The risks and                         benefits of the procedure and the sedation options and                         risks were discussed with the patient. All questions                         were answered and informed consent was obtained.                         Patient identification and proposed procedure were                         verified by the physician, the nurse, the                         anesthesiologist, the anesthetist and the technician                         in the pre-procedure area in the procedure room in the                         endoscopy suite. Mental Status Examination: alert and                         oriented. Airway Examination: normal oropharyngeal                         airway and neck mobility. Respiratory Examination:                         clear to auscultation. CV Examination: normal.                         Prophylactic Antibiotics: The patient does not  require                         prophylactic antibiotics. Prior Anticoagulants: The                         patient has taken no previous anticoagulant or                         antiplatelet agents. ASA  Grade Assessment: II - A                         patient with mild systemic disease. After reviewing                         the risks and benefits, the patient was deemed in                         satisfactory condition to undergo the procedure. The                         anesthesia plan was to use general anesthesia.                         Immediately prior to administration of medications,                         the patient was re-assessed for adequacy to receive                         sedatives. The heart rate, respiratory rate, oxygen                         saturations, blood pressure, adequacy of pulmonary                         ventilation, and response to care were monitored                         throughout the procedure. The physical status of the                         patient was re-assessed after the procedure.                        After obtaining informed consent, the scope was passed                         under direct vision. The Endoscope was introduced                         through the anus and advanced to the the left                         transverse colon. The flexible sigmoidoscopy was                         accomplished without difficulty. The patient tolerated  the procedure well. The quality of the bowel                         preparation was adequate. Findings:      The perianal and digital rectal examinations were normal. Pertinent       negatives include normal sphincter tone and no palpable rectal lesions.      Two sessile polyps were found in the rectum and descending colon. The       polyps were 5 to 6 mm in size. These polyps were removed with a cold       snare. Resection and retrieval were complete. Estimated blood loss: none.      Non-bleeding external hemorrhoids were found during retroflexion. The       hemorrhoids were small. Impression:            - Two 5 to 6 mm polyps in the rectum and in the                          descending colon, removed with a cold snare. Resected                         and retrieved.                        - Non-bleeding external hemorrhoids. Recommendation:        - Discharge patient to home (with escort).                        - Resume previous diet today.                        - Await pathology results.                        - Perform a colonoscopy based on polyp pathology                         results. Procedure Code(s):     --- Professional ---                        [phone], Sigmoidoscopy, flexible; with removal of                         tumor(s), polyp(s), or other lesion(s) by snare                         technique Diagnosis Code(s):     --- Professional ---                        K62.1, Rectal polyp                        K63.5, Polyp of colon                        K64.4, Residual hemorrhoidal skin tags                        K62.89, Other specified diseases of anus and rectum CPT copyright 2019 American Medical Association.  All rights reserved. The codes documented in this report are preliminary and upon coder review may  be revised to meet current compliance requirements. Dr. Ulyess Mort Lin Landsman MD, MD 09/11/2020 12:10:06 PM This report has been signed electronically. Number of Addenda: 0 Note Initiated On: 09/11/2020 11:29 AM Total Procedure Duration: 0 hours 12 minutes 4 seconds  Estimated Blood Loss:  Estimated blood loss: none.      [facility]

## 2020-09-11 NOTE — Anesthesia Preprocedure Evaluation (Signed)
Anesthesia Evaluation  Patient identified by MRN, date of birth, ID band Patient awake    Reviewed: Allergy & Precautions, NPO status , Patient's Chart, lab work & pertinent test results  History of Anesthesia Complications Negative for: history of anesthetic complications  Airway Mallampati: II       Dental   Pulmonary neg sleep apnea, neg COPD, Not current smoker,           Cardiovascular (-) hypertension(-) Past MI and (-) CHF (-) dysrhythmias (-) Valvular Problems/Murmurs     Neuro/Psych neg Seizures Anxiety Depression    GI/Hepatic Neg liver ROS, GERD  Medicated and Controlled,  Endo/Other  neg diabetes  Renal/GU negative Renal ROS     Musculoskeletal   Abdominal   Peds  Hematology   Anesthesia Other Findings   Reproductive/Obstetrics                             Anesthesia Physical Anesthesia Plan  ASA: II  Anesthesia Plan: General   Post-op Pain Management:    Induction: Intravenous  PONV Risk Score and Plan: 2  Airway Management Planned: Nasal Cannula  Additional Equipment:   Intra-op Plan:   Post-operative Plan:   Informed Consent: I have reviewed the patients History and Physical, chart, labs and discussed the procedure including the risks, benefits and alternatives for the proposed anesthesia with the patient or authorized representative who has indicated his/her understanding and acceptance.       Plan Discussed with:   Anesthesia Plan Comments:         Anesthesia Quick Evaluation

## 2020-09-11 NOTE — Anesthesia Postprocedure Evaluation (Signed)
Anesthesia Post Note  Patient: Jacob [name]  Procedure(s) Performed: FLEXIBLE SIGMOIDOSCOPY (N/A ) ESOPHAGOGASTRODUODENOSCOPY (EGD) WITH PROPOFOL (N/A )  Patient location during evaluation: Endoscopy Anesthesia Type: General Level of consciousness: awake and alert Pain management: pain level controlled Vital Signs Assessment: post-procedure vital signs reviewed and stable Respiratory status: spontaneous breathing and respiratory function stable Cardiovascular status: stable Anesthetic complications: no   No complications documented.   Last Vitals:  Vitals:   09/11/20 1230 09/11/20 1240  BP: (!) 139/94 (!) 125/95  Pulse:    Resp: 16   Temp:    SpO2: 98%     Last Pain:  Vitals:   09/11/20 1240  TempSrc:   PainSc: 0-No pain                 Aloma Boch K

## 2020-09-11 NOTE — H&P (Signed)
  Jacob Darby, MD [address]  [place]  [place], [place] [address]  Main: [phone]  Fax: [phone] Pager: [phone]  Primary Care Physician:  Valerie Roys, DO Primary Gastroenterologist:  Dr. Cephas [name]  Pre-Procedure History & Physical: HPI:  Jacob [name] is a 30 y.o. male is here for an endoscopy and flexible sigmoidoscopy.   Past Medical History:  Diagnosis Date  . Anxiety   . Depression     History reviewed. No pertinent surgical history.  Prior to Admission medications   Medication Sig Start Date End Date Taking? Authorizing Provider  omeprazole (PRILOSEC) 20 MG capsule Take 1 capsule (20 mg total) by mouth daily. 08/22/20  Yes Johnson, Megan P, DO  ondansetron (ZOFRAN) 4 MG tablet Take 1 tablet (4 mg total) by mouth every 8 (eight) hours as needed for nausea or vomiting. 08/04/20  Yes Hawks, Christy A, FNP  escitalopram (LEXAPRO) 5 MG tablet Take 0.5 tablets (2.5 mg total) by mouth daily. 09/10/20   Park Liter P, DO    Allergies as of 08/30/2020  . (No Known Allergies)    Family History  Problem Relation Age of Onset  . Autoimmune disease Father   . Autoimmune disease Sister   . Autoimmune disease Brother     Social History   Socioeconomic History  . Marital status: Single    Spouse name: Not on file  . Number of children: Not on file  . Years of education: Not on file  . Highest education level: Not on file  Occupational History  . Not on file  Tobacco Use  . Smoking status: Never Smoker  . Smokeless tobacco: Never Used  Vaping Use  . Vaping Use: Some days  Substance and Sexual Activity  . Alcohol use: Yes    Comment: Socially  . Drug use: Yes    Types: Marijuana    Comment: ocassional   . Sexual activity: Yes  Other Topics Concern  . Not on file  Social History Narrative  . Not on file   Social Determinants of Health   Financial Resource Strain: Not on file  Food Insecurity: Not on file  Transportation Needs:  Not on file  Physical Activity: Not on file  Stress: Not on file  Social Connections: Not on file  Intimate Partner Violence: Not on file    Review of Systems: See HPI, otherwise negative ROS  Physical Exam: BP (!) 145/110   Pulse (!) 111   Temp (!) 97.4 F (36.3 C) (Temporal)   Resp 20   Ht 6' (1.829 m)   Wt 118.4 kg   SpO2 99%   BMI 35.40 kg/m  General:   Alert,  pleasant and cooperative in NAD Head:  Normocephalic and atraumatic. Neck:  Supple; no masses or thyromegaly. Lungs:  Clear throughout to auscultation.    Heart:  Regular rate and rhythm. Abdomen:  Soft, nontender and nondistended. Normal bowel sounds, without guarding, and without rebound.   Neurologic:  Alert and  oriented x4;  grossly normal neurologically.  Impression/Plan: Jacob [name] is here for an endoscopy and flexible sigmoidoscopy to be performed for fullness in rectum, chronic gerd  Risks, benefits, limitations, and alternatives regarding  endoscopy and flexible sigmoidoscopy have been reviewed with the patient.  Questions have been answered.  All parties agreeable.   Sherri Sear, MD  09/11/2020, 9:59 AM

## 2020-09-12 ENCOUNTER — Encounter: Payer: Self-pay | Admitting: Gastroenterology

## 2020-09-12 LAB — SURGICAL PATHOLOGY

## 2020-09-16 ENCOUNTER — Telehealth: Payer: Self-pay

## 2020-09-16 NOTE — Telephone Encounter (Signed)
Patient verbalized understanding of results  

## 2020-09-16 NOTE — Telephone Encounter (Signed)
-----   Message from Lin Landsman, MD sent at 09/15/2020  9:53 PM EDT ----- Colon polyp came back as adenoma which is precancerous polyp. Therefore, recommend colonoscopy Esophageal biopsies came back as reflux esophagitis, recommend to continue omeprazole 20mg  daily  Will see him for follow up as scheduled  RV

## 2020-09-26 ENCOUNTER — Encounter: Payer: Self-pay | Admitting: Gastroenterology

## 2020-10-01 ENCOUNTER — Encounter: Payer: Self-pay | Admitting: Family Medicine

## 2020-10-01 ENCOUNTER — Telehealth: Payer: Self-pay

## 2020-10-01 ENCOUNTER — Other Ambulatory Visit: Payer: Self-pay

## 2020-10-01 ENCOUNTER — Telehealth (INDEPENDENT_AMBULATORY_CARE_PROVIDER_SITE_OTHER): Payer: BC Managed Care – PPO | Admitting: Family Medicine

## 2020-10-01 DIAGNOSIS — F331 Major depressive disorder, recurrent, moderate: Secondary | ICD-10-CM | POA: Diagnosis not present

## 2020-10-01 NOTE — Telephone Encounter (Signed)
lvm to make this apt and copay

## 2020-10-01 NOTE — Telephone Encounter (Signed)
-----   Message from Valerie Roys, DO sent at 10/01/2020 10:17 AM EDT ----- 4 weeks virtual OK

## 2020-10-01 NOTE — Progress Notes (Signed)
Temp 98.6 F (37 C) (Oral)   Wt 257 lb (116.6 kg)   BMI 34.86 kg/m    Subjective:    Patient ID: Jacob [name], male    DOB: [birth year], 30 y.o.   MRN: [id number]  HPI: Jacob [name] is a 30 y.o. male  Chief Complaint  Patient presents with  . Depression  . Anxiety   DEPRESSION Mood status: better Satisfied with current treatment?: yes Symptom severity: moderate  Duration of current treatment : couple of weeks Side effects: no Medication compliance: excellent compliance Psychotherapy/counseling: no  Previous psychiatric medications: lexapro, wellbutrin, sertraline Depressed mood: yes Anxious mood: yes Anhedonia: no Significant weight loss or gain: no Insomnia: no  Fatigue: yes Feelings of worthlessness or guilt: no Impaired concentration/indecisiveness: yes Suicidal ideations: no Hopelessness: no Crying spells: yes Depression screen [facility] 2/9 10/01/2020 09/10/2020 08/22/2020 03/31/2019 03/03/2019  Decreased Interest 1 1 3  0 1  Down, Depressed, Hopeless 1 1 3 1 1   PHQ - 2 Score 2 2 6 1 2   Altered sleeping 2 3 0 3 0  Tired, decreased energy 3 1 3 2 3   Change in appetite 0 3 3 0 2  Feeling bad or failure about yourself  0 1 2 0 0  Trouble concentrating 2 2 2 2 3   Moving slowly or fidgety/restless 0 0 1 0 0  Suicidal thoughts 0 0 1 0 0  PHQ-9 Score 9 12 18 8 10   Difficult doing work/chores Somewhat difficult Somewhat difficult Somewhat difficult Somewhat difficult Very difficult   GAD 7 : Generalized Anxiety Score 10/01/2020 09/10/2020 08/22/2020 03/31/2019  Nervous, Anxious, on Edge 1 1 3  0  Control/stop worrying 1 2 2  0  Worry too much - different things 0 1 3 0  Trouble relaxing 1 1 3  0  Restless 1 1 0 1  Easily annoyed or irritable 0 0 1 2  Afraid - awful might happen 1 1 1  0  Total GAD 7 Score 5 7 13 3   Anxiety Difficulty Not difficult at all Somewhat difficult Somewhat difficult Somewhat difficult    Relevant past medical, surgical, family and social history  reviewed and updated as indicated. Interim medical history since our last visit reviewed. Allergies and medications reviewed and updated.  Review of Systems  Constitutional: Negative.   Respiratory: Negative.   Cardiovascular: Negative.   Gastrointestinal: Negative.   Psychiatric/Behavioral: Positive for dysphoric mood. Negative for agitation, behavioral problems, confusion, decreased concentration, hallucinations, self-injury, sleep disturbance and suicidal ideas. The patient is nervous/anxious. The patient is not hyperactive.     Per HPI unless specifically indicated above     Objective:    Temp 98.6 F (37 C) (Oral)   Wt 257 lb (116.6 kg)   BMI 34.86 kg/m   Wt Readings from Last 3 Encounters:  10/01/20 257 lb (116.6 kg)  09/11/20 261 lb (118.4 kg)  09/10/20 261 lb 12.8 oz (118.8 kg)    Physical Exam Vitals and nursing note reviewed.  Constitutional:      General: He is not in acute distress.    Appearance: Normal appearance. He is not ill-appearing, toxic-appearing or diaphoretic.  HENT:     Head: Normocephalic and atraumatic.     Right Ear: External ear normal.     Left Ear: External ear normal.     Nose: Nose normal.     Mouth/Throat:     Mouth: Mucous membranes are moist.     Pharynx: Oropharynx is clear.  Eyes:  General: No scleral icterus.       Right eye: No discharge.        Left eye: No discharge.     Conjunctiva/sclera: Conjunctivae normal.     Pupils: Pupils are equal, round, and reactive to light.  Pulmonary:     Effort: Pulmonary effort is normal. No respiratory distress.     Comments: Speaking in full sentences Musculoskeletal:        General: Normal range of motion.     Cervical back: Normal range of motion.  Skin:    Coloration: Skin is not jaundiced or pale.     Findings: No bruising, erythema, lesion or rash.  Neurological:     Mental Status: He is alert and oriented to person, place, and time. Mental status is at baseline.  Psychiatric:         Mood and Affect: Mood normal.        Behavior: Behavior normal.        Thought Content: Thought content normal.        Judgment: Judgment normal.     Results for orders placed or performed during the hospital encounter of 09/11/20  Surgical pathology  Result Value Ref Range   SURGICAL PATHOLOGY      SURGICAL PATHOLOGY CASE: [id number] PATIENT: Jacob [name] Surgical Pathology Report     Specimen Submitted: A. Esophagus, random; cbx B. Colon polyp, descending; cold snare C. Rectum polyp; cold snare  Clinical History: Chronic GERD K21.9 EGD flex-sigmoid alter in bowel habits R19.4.  Normal EGD, colon polyps, external hemorrhoids    DIAGNOSIS: A. ESOPHAGUS, RANDOM; COLD BIOPSY: - BENIGN SQUAMOUS MUCOSA WITH FOCAL FEATURES OF REFLUX ESOPHAGITIS. - NO INCREASE IN INTRAEPITHELIAL EOSINOPHILS (LESS THAN 2 PER HPF). - NEGATIVE FOR DYSPLASIA AND MALIGNANCY.  B. COLON POLYP, DESCENDING; COLD SNARE: - BENIGN COLONIC MUCOSA WITH SUPERFICIAL REACTIVE/INFLAMMATORY TYPE CHANGES AND SMALL LYMPHOID AGGREGATE. - NEGATIVE FOR DYSPLASIA AND MALIGNANCY.  C. RECTAL POLYP; COLD SNARE: - TUBULAR ADENOMA. - NEGATIVE FOR HIGH-GRADE DYSPLASIA AND MALIGNANCY.  GROSS DESCRIPTION: A. Labeled: cbx random esophagus for chronic gastroesophageal reflux disease Received: Formalin  Collection time: 11:01 AM on 09/11/2020 Placed into formalin time: 11:01 AM on 09/11/2020 Tissue fragment(s): Multiple Size: Aggregate, 1 x 0.5 x 0.1 cm Description: White translucent soft tissue fragments Entirely submitted in 1 cassette.  B. Labeled: Cold snared descending colon polyp Received: Formalin Collection time: 11:57 AM on 09/11/2020 Placed into formalin time: 11:57 AM on 09/11/2020 Tissue fragment(s): Multiple Size: Aggregate, 1.2 x 0.6 x 0.3 cm Description: Received is at least one fragment of tan-pink soft tissue admixed with intestinal debris.  The ratio of soft tissue to intestinal debris is  30: 70. Entirely submitted in 1 cassette.  C. Labeled: Cold snared rectal colon polyp Received: Formalin Collection time: 12:03 PM on 09/11/2020 Placed into formalin time: 12:03 PM on 09/11/2020 Tissue fragment(s): Multiple Size: Aggregate, 1.5 x 1 x 0.3 cm Description: Received are at least 2 fragments of tan-pink soft tissue admixed with intestinal debris.  Th e ratio of soft tissue to intestinal debris is 60: 40. Entirely submitted in 1 cassette.  RB 09/11/2020 it asleep  Final Diagnosis performed by Allena Napoleon, MD.   Electronically signed 09/12/2020 11:01:18AM The electronic signature indicates that the named Attending Pathologist has evaluated the specimen Technical component performed at [facility], [address], [place], [place] [address] Lab: [phone] Dir: Rush Farmer, MD, MMM  Professional component performed at [facility], [facility], [address] [place], [place]  [place] Lab: [phone] Dir: Dellia Nims. Reuel Derby, MD       Assessment & Plan:   Problem List Items Addressed This Visit      Other   Depression    Doing better. Will increase his lexapro to 5mg  and recheck 1 month. Call with any concerns. Continue to monitor.           Follow up plan: Return in about 4 weeks (around 10/29/2020).   . This visit was completed via video visit through MyChart due to the restrictions of the COVID-19 pandemic. All issues as above were discussed and addressed. Physical exam was done as above through visual confirmation on video through MyChart. If it was felt that the patient should be evaluated in the office, they were directed there. The patient verbally consented to this visit. . Location of the patient: home . Location of the provider: work . Those involved with this call:  . Provider: Park Liter, DO . CMA: Louanna Raw, [place] . Front Desk/Registration: Jill Side  . Time spent on call: 15 minutes with patient face to face via video  conference. More than 50% of this time was spent in counseling and coordination of care. 23 minutes total spent in review of patient's record and preparation of their chart.

## 2020-10-01 NOTE — Assessment & Plan Note (Signed)
Doing better. Will increase his lexapro to 5mg  and recheck 1 month. Call with any concerns. Continue to monitor.

## 2020-10-03 NOTE — Telephone Encounter (Signed)
lvm to make this apt.  

## 2020-10-04 NOTE — Telephone Encounter (Signed)
Unable to leave voicemail to make apt.

## 2020-10-15 ENCOUNTER — Ambulatory Visit
Admission: RE | Admit: 2020-10-15 | Discharge: 2020-10-15 | Disposition: A | Discharge status: Home / Self Care | Payer: BC Managed Care – PPO | Source: Ambulatory Visit | Attending: Gastroenterology | Admitting: Gastroenterology

## 2020-10-15 ENCOUNTER — Other Ambulatory Visit: Payer: Self-pay

## 2020-10-15 ENCOUNTER — Encounter: Payer: Self-pay | Admitting: Family Medicine

## 2020-10-15 DIAGNOSIS — R7989 Other specified abnormal findings of blood chemistry: Secondary | ICD-10-CM | POA: Insufficient documentation

## 2020-10-15 DIAGNOSIS — R945 Abnormal results of liver function studies: Secondary | ICD-10-CM | POA: Diagnosis not present

## 2020-10-16 ENCOUNTER — Encounter: Payer: Self-pay | Admitting: Gastroenterology

## 2020-10-30 ENCOUNTER — Ambulatory Visit: Payer: BC Managed Care – PPO | Admitting: Gastroenterology

## 2020-11-01 ENCOUNTER — Other Ambulatory Visit: Payer: Self-pay | Admitting: Family Medicine

## 2020-11-01 NOTE — Telephone Encounter (Signed)
   Notes to clinic: Requesting a 90 day supply    Requested Prescriptions  Pending Prescriptions Disp Refills   escitalopram (LEXAPRO) 5 MG tablet [Pharmacy Med Name: ESCITALOPRAM 5 MG TABLET] 45 tablet 2    Sig: TAKE 1/2 TABLET BY MOUTH DAILY      Psychiatry:  Antidepressants - SSRI Passed - 11/01/2020  1:33 PM      Passed - Completed PHQ-2 or PHQ-9 in the last 360 days      Passed - Valid encounter within last 6 months    Recent Outpatient Visits           1 month ago Moderate episode of recurrent major depressive disorder ([place])   [place], Megan P, DO   1 month ago Moderate episode of recurrent major depressive disorder ([place])   [place], Megan P, DO   2 months ago Moderate episode of recurrent major depressive disorder ([place])   [place], Megan P, DO   1 year ago Headache associated with sexual activity   [place], DO   1 year ago Headache associated with sexual activity   [place], [place] P, DO       Future Appointments             In 3 weeks [place], Tally Due, MD [place]

## 2020-11-01 NOTE — Telephone Encounter (Signed)
Called pt to schedule Return in about 4 weeks (around 10/29/2020). F/u no answer left vm

## 2020-11-11 NOTE — Telephone Encounter (Signed)
Pt scheduled for virtual apt on 11/19/2020

## 2020-11-19 ENCOUNTER — Telehealth (INDEPENDENT_AMBULATORY_CARE_PROVIDER_SITE_OTHER): Payer: BC Managed Care – PPO | Admitting: Family Medicine

## 2020-11-19 ENCOUNTER — Telehealth: Payer: Self-pay

## 2020-11-19 ENCOUNTER — Other Ambulatory Visit: Payer: Self-pay

## 2020-11-19 ENCOUNTER — Encounter: Payer: Self-pay | Admitting: Family Medicine

## 2020-11-19 DIAGNOSIS — F331 Major depressive disorder, recurrent, moderate: Secondary | ICD-10-CM | POA: Diagnosis not present

## 2020-11-19 MED ORDER — DULOXETINE HCL 20 MG PO CPEP: ORAL_CAPSULE | ORAL | 3 refills | Status: DC

## 2020-11-19 NOTE — Telephone Encounter (Signed)
Called pt to collect co pay no answer left vm

## 2020-11-19 NOTE — Progress Notes (Signed)
There were no vitals taken for this visit.   Subjective:    Patient ID: Jacob [name], male    DOB: 09/18/1990, 30 y.o.   MRN: [id number]  HPI: Jacob [name] is a 30 y.o. male  Chief Complaint  Patient presents with   Depression    Medication made him really dizzy and made it hard to concentrate.    DEPRESSION- tough to tell how he's feeling because he caught COVID at the beginning of the month. Had some significant brain fog, so that made it hard to tell. He is not having issues with his brain fog now Duration: chronic Mood status: stable Satisfied with current treatment?: no Symptom severity: moderate  Duration of current treatment : chronic Side effects: yes- dizziness and decreased concentration Medication compliance: excellent compliance Psychotherapy/counseling: no  Previous psychiatric medications: lexapro, wellbutrin, zoloft Depressed mood: yes Anxious mood: yes Anhedonia: no Significant weight loss or gain: no Insomnia: no  Fatigue: no Feelings of worthlessness or guilt: no Impaired concentration/indecisiveness: yes Suicidal ideations: no Hopelessness: no Crying spells: yes Depression screen [facility] 2/9 11/19/2020 10/01/2020 09/10/2020 08/22/2020 03/31/2019  Decreased Interest 3 1 1 3  0  Down, Depressed, Hopeless 2 1 1 3 1   PHQ - 2 Score 5 2 2 6 1   Altered sleeping 0 2 3 0 3  Tired, decreased energy 3 3 1 3 2   Change in appetite 3 0 3 3 0  Feeling bad or failure about yourself  1 0 1 2 0  Trouble concentrating 1 2 2 2 2   Moving slowly or fidgety/restless 1 0 0 1 0  Suicidal thoughts 1 0 0 1 0  PHQ-9 Score 15 9 12 18 8   Difficult doing work/chores Very difficult Somewhat difficult Somewhat difficult Somewhat difficult Somewhat difficult  Some recent data might be hidden    Relevant past medical, surgical, family and social history reviewed and updated as indicated. Interim medical history since our last visit reviewed. Allergies and medications reviewed and  updated.  Review of Systems  Constitutional: Negative.   Respiratory: Negative.    Cardiovascular: Negative.   Gastrointestinal: Negative.   Musculoskeletal: Negative.   Psychiatric/Behavioral:  Positive for dysphoric mood. Negative for agitation, behavioral problems, confusion, decreased concentration, hallucinations, self-injury, sleep disturbance and suicidal ideas. The patient is nervous/anxious. The patient is not hyperactive.    Per HPI unless specifically indicated above     Objective:    There were no vitals taken for this visit.  Wt Readings from Last 3 Encounters:  10/01/20 257 lb (116.6 kg)  09/11/20 261 lb (118.4 kg)  09/10/20 261 lb 12.8 oz (118.8 kg)    Physical Exam Vitals and nursing note reviewed.  Constitutional:      General: He is not in acute distress.    Appearance: Normal appearance. He is not ill-appearing, toxic-appearing or diaphoretic.  HENT:     Head: Normocephalic and atraumatic.     Right Ear: External ear normal.     Left Ear: External ear normal.     Nose: Nose normal.     Mouth/Throat:     Mouth: Mucous membranes are moist.     Pharynx: Oropharynx is clear.  Eyes:     General: No scleral icterus.       Right eye: No discharge.        Left eye: No discharge.     Conjunctiva/sclera: Conjunctivae normal.     Pupils: Pupils are equal, round, and reactive to light.  Pulmonary:  Effort: Pulmonary effort is normal. No respiratory distress.     Comments: Speaking in full sentences Musculoskeletal:        General: Normal range of motion.     Cervical back: Normal range of motion.  Skin:    Coloration: Skin is not jaundiced or pale.     Findings: No bruising, erythema, lesion or rash.  Neurological:     Mental Status: He is alert and oriented to person, place, and time. Mental status is at baseline.  Psychiatric:        Mood and Affect: Mood normal.        Behavior: Behavior normal.        Thought Content: Thought content normal.         Judgment: Judgment normal.    Results for orders placed or performed during the hospital encounter of 09/11/20  Surgical pathology  Result Value Ref Range   SURGICAL PATHOLOGY      SURGICAL PATHOLOGY CASE: [id number] PATIENT: Isamar Lacerda Surgical Pathology Report     Specimen Submitted: A. Esophagus, random; cbx B. Colon polyp, descending; cold snare C. Rectum polyp; cold snare  Clinical History: Chronic GERD K21.9 EGD flex-sigmoid alter in bowel habits R19.4.  Normal EGD, colon polyps, external hemorrhoids    DIAGNOSIS: A. ESOPHAGUS, RANDOM; COLD BIOPSY: - BENIGN SQUAMOUS MUCOSA WITH FOCAL FEATURES OF REFLUX ESOPHAGITIS. - NO INCREASE IN INTRAEPITHELIAL EOSINOPHILS (LESS THAN 2 PER HPF). - NEGATIVE FOR DYSPLASIA AND MALIGNANCY.  B. COLON POLYP, DESCENDING; COLD SNARE: - BENIGN COLONIC MUCOSA WITH SUPERFICIAL REACTIVE/INFLAMMATORY TYPE CHANGES AND SMALL LYMPHOID AGGREGATE. - NEGATIVE FOR DYSPLASIA AND MALIGNANCY.  C. RECTAL POLYP; COLD SNARE: - TUBULAR ADENOMA. - NEGATIVE FOR HIGH-GRADE DYSPLASIA AND MALIGNANCY.  GROSS DESCRIPTION: A. Labeled: cbx random esophagus for chronic gastroesophageal reflux disease Received: Formalin  Collection time: 11:01 AM on 09/11/2020 Placed into formalin time: 11:01 AM on 09/11/2020 Tissue fragment(s): Multiple Size: Aggregate, 1 x 0.5 x 0.1 cm Description: White translucent soft tissue fragments Entirely submitted in 1 cassette.  B. Labeled: Cold snared descending colon polyp Received: Formalin Collection time: 11:57 AM on 09/11/2020 Placed into formalin time: 11:57 AM on 09/11/2020 Tissue fragment(s): Multiple Size: Aggregate, 1.2 x 0.6 x 0.3 cm Description: Received is at least one fragment of tan-pink soft tissue admixed with intestinal debris.  The ratio of soft tissue to intestinal debris is 30: 70. Entirely submitted in 1 cassette.  C. Labeled: Cold snared rectal colon polyp Received: Formalin Collection time: 12:03  PM on 09/11/2020 Placed into formalin time: 12:03 PM on 09/11/2020 Tissue fragment(s): Multiple Size: Aggregate, 1.5 x 1 x 0.3 cm Description: Received are at least 2 fragments of tan-pink soft tissue admixed with intestinal debris.  Th e ratio of soft tissue to intestinal debris is 60: 40. Entirely submitted in 1 cassette.  RB 09/11/2020 it asleep  Final Diagnosis performed by Allena Napoleon, MD.   Electronically signed 09/12/2020 11:01:18AM The electronic signature indicates that the named Attending Pathologist has evaluated the specimen Technical component performed at [facility], [address] [place], [place] [address] Lab: [phone] Dir: Rush Farmer, MD, MMM  Professional component performed at [facility], [facility], [place], [place], [place] [address] Lab: [phone] Dir: Dellia Nims. Reuel Derby, MD       Assessment & Plan:   Problem List Items Addressed This Visit       Other   Depression    Not under good control. Failed 2 SSRIs- will change to cymbalta and recheck 1  month. Call with any concerns. Continue to monitor.        Relevant Medications   DULoxetine (CYMBALTA) 20 MG capsule     Follow up plan: Return in about 4 weeks (around 12/17/2020).     This visit was completed via video visit through MyChart due to the restrictions of the COVID-19 pandemic. All issues as above were discussed and addressed. Physical exam was done as above through visual confirmation on video through MyChart. If it was felt that the patient should be evaluated in the office, they were directed there. The patient verbally consented to this visit. Location of the patient: home Location of the provider: work Those involved with this call:  Provider: Park Liter, DO CMA: Frazier Butt, CMA Front Desk/Registration: Jill Side  Time spent on call:  15 minutes with patient face to face via video conference. More than 50% of this time was spent in counseling and  coordination of care. 23 minutes total spent in review of patient's record and preparation of their chart.

## 2020-11-19 NOTE — Assessment & Plan Note (Signed)
Not under good control. Failed 2 SSRIs- will change to cymbalta and recheck 1 month. Call with any concerns. Continue to monitor.

## 2020-11-28 ENCOUNTER — Other Ambulatory Visit: Payer: Self-pay

## 2020-11-28 ENCOUNTER — Encounter: Payer: Self-pay | Admitting: Gastroenterology

## 2020-11-28 ENCOUNTER — Ambulatory Visit: Payer: BC Managed Care – PPO | Admitting: Gastroenterology

## 2020-11-28 VITALS — BP 158/95 | HR 93 | Temp 98.5°F | Ht 72.0 in | Wt 263.2 lb

## 2020-11-28 DIAGNOSIS — D126 Benign neoplasm of colon, unspecified: Secondary | ICD-10-CM | POA: Diagnosis not present

## 2020-11-28 DIAGNOSIS — K219 Gastro-esophageal reflux disease without esophagitis: Secondary | ICD-10-CM

## 2020-11-28 DIAGNOSIS — R7989 Other specified abnormal findings of blood chemistry: Secondary | ICD-10-CM

## 2020-11-28 NOTE — Patient Instructions (Addendum)
Please call us if you decide to schedule a colonoscopy at [phone]  Gastroesophageal Reflux Disease, Adult  Gastroesophageal reflux (GER) happens when acid from the stomach flows up into the tube that connects the mouth and the stomach (esophagus). Normally, food travels down the esophagus and stays in the stomach to be digested. With GER, food and stomach acid sometimes move back up into theesophagus. You may have a disease called gastroesophageal reflux disease (GERD) if the reflux: Happens often. Causes frequent or very bad symptoms. Causes problems such as damage to the esophagus. When this happens, the esophagus becomes sore and swollen. Over time, GERD can make small holes (ulcers) in the lining of the esophagus. What are the causes? This condition is caused by a problem with the muscle between the esophagus and the stomach. When this muscle is weak or not normal, it does not close properlyto keep food and acid from coming back up from the stomach. The muscle can be weak because of: Tobacco use. Pregnancy. Having a certain type of hernia (hiatal hernia). Alcohol use. Certain foods and drinks, such as coffee, chocolate, onions, and peppermint. What increases the risk? Being overweight. Having a disease that affects your connective tissue. Taking NSAIDs, such a ibuprofen. What are the signs or symptoms? Heartburn. Difficult or painful swallowing. The feeling of having a lump in the throat. A bitter taste in the mouth. Bad breath. Having a lot of saliva. Having an upset or bloated stomach. Burping. Chest pain. Different conditions can cause chest pain. Make sure you see your doctor if you have chest pain. Shortness of breath or wheezing. A long-term cough or a cough at night. Wearing away of the surface of teeth (tooth enamel). Weight loss. How is this treated? Making changes to your diet. Taking medicine. Having surgery. Treatment will depend on how bad your symptoms  are. Follow these instructions at home: Eating and drinking  Follow a diet as told by your doctor. You may need to avoid foods and drinks such as: Coffee and tea, with or without caffeine. Drinks that contain alcohol. Energy drinks and sports drinks. Bubbly (carbonated) drinks or sodas. Chocolate and cocoa. Peppermint and mint flavorings. Garlic and onions. Horseradish. Spicy and acidic foods. These include peppers, chili powder, curry powder, vinegar, hot sauces, and BBQ sauce. Citrus fruit juices and citrus fruits, such as oranges, lemons, and limes. Tomato-based foods. These include red sauce, chili, salsa, and pizza with red sauce. Fried and fatty foods. These include donuts, french fries, potato chips, and high-fat dressings. High-fat meats. These include hot dogs, rib eye steak, sausage, ham, and bacon. High-fat dairy items, such as whole milk, butter, and cream cheese. Eat small meals often. Avoid eating large meals. Avoid drinking large amounts of liquid with your meals. Avoid eating meals during the 2-3 hours before bedtime. Avoid lying down right after you eat. Do not exercise right after you eat.  Lifestyle  Do not smoke or use any products that contain nicotine or tobacco. If you need help quitting, ask your doctor. Try to lower your stress. If you need help doing this, ask your doctor. If you are overweight, lose an amount of weight that is healthy for you. Ask your doctor about a safe weight loss goal.  General instructions Pay attention to any changes in your symptoms. Take over-the-counter and prescription medicines only as told by your doctor. Do not take aspirin, ibuprofen, or other NSAIDs unless your doctor says it is okay. Wear loose clothes. Do not wear  anything tight around your waist. Raise (elevate) the head of your bed about 6 inches (15 cm). You may need to use a wedge to do this. Avoid bending over if this makes your symptoms worse. Keep all follow-up  visits. Contact a doctor if: You have new symptoms. You lose weight and you do not know why. You have trouble swallowing or it hurts to swallow. You have wheezing or a cough that keeps happening. You have a hoarse voice. Your symptoms do not get better with treatment. Get help right away if: You have sudden pain in your arms, neck, jaw, teeth, or back. You suddenly feel sweaty, dizzy, or light-headed. You have chest pain or shortness of breath. You vomit and the vomit is green, yellow, or black, or it looks like blood or coffee grounds. You faint. Your poop (stool) is red, bloody, or black. You cannot swallow, drink, or eat. These symptoms may represent a serious problem that is an emergency. Do not wait to see if the symptoms will go away. Get medical help right away. Call your local emergency services (911 in the [place] Do not drive yourself to the hospital. Summary If a person has gastroesophageal reflux disease (GERD), food and stomach acid move back up into the esophagus and cause symptoms or problems such as damage to the esophagus. Treatment will depend on how bad your symptoms are. Follow a diet as told by your doctor. Take all medicines only as told by your doctor. This information is not intended to replace advice given to you by your health care provider. Make sure you discuss any questions you have with your healthcare provider. Document Revised: 11/20/2019 Document Reviewed: 11/20/2019 Elsevier Patient Education  [place].

## 2020-11-28 NOTE — Progress Notes (Signed)
Cephas Darby, MD [address]  [place]  [place], [place] [address]  Main: [phone]  Fax: [phone]    Gastroenterology Consultation  Referring Provider:     Valerie Roys, DO Primary Care Physician:  Valerie Roys, DO Primary Gastroenterologist:  Dr. Cephas Darby Reason for Consultation:     Constipation, chronic GERD, elevated LFTs        HPI:   Jacob [name] is a 30 y.o. male referred by Dr. Wynetta Emery, Barb Merino, DO  for consultation & management of constipation.  Patient reports approximately 1 month history of constipation, incomplete emptying and sensation of fullness in the rectum.  He was originally seen by his PCP on 3/31, he was recommended to take MiraLAX 2 times daily for 2 days which resulted in bowel movement, however he felt some fullness in the rectum about 1 to 2 inches inside.  He switched his diet from high carb to high-fiber, pretty much eating fruits and vegetables every day that has resulted in bowel movements daily which are soft.  Patient denies any rectal bleeding, abdominal pain, abdominal bloating  He has been experiencing chronic heartburn, has been on omeprazole 20 mg for few years.  He denies any other GI symptoms.  He is found to have mildly elevated LFTs since 2019  He does not smoke or drink alcohol He is being treated for depression.  He is not physically active.  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  He underwent upper endoscopy which was unremarkable, random esophageal biopsies revealed reflux esophagitis.  Flexible sigmoidoscopy revealed tubular adenoma in the rectum.  Patient reports that his reflux is under control on omeprazole 20 mg daily.  He is no longer experiencing rectal pressure or fullness in the rectum since he had a sigmoidoscopy.  NSAIDs: None  Antiplts/Anticoagulants/Anti  thrombotics: None  GI Procedures: EGD and colonoscopy 09/11/2020 - Normal duodenal bulb and second portion of the duodenum. - Normal stomach. - Esophagogastric landmarks identified. - Normal gastroesophageal junction and esophagus. Biopsied.  - Two 5 to 6 mm polyps in the rectum and in the descending colon, removed with a cold snare. Resected and retrieved. - Non-bleeding external hemorrhoids. DIAGNOSIS:  A. ESOPHAGUS, RANDOM; COLD BIOPSY:  - BENIGN SQUAMOUS MUCOSA WITH FOCAL FEATURES OF REFLUX ESOPHAGITIS.  - NO INCREASE IN INTRAEPITHELIAL EOSINOPHILS (LESS THAN 2 PER HPF).  - NEGATIVE FOR DYSPLASIA AND MALIGNANCY.   B. COLON POLYP, DESCENDING; COLD SNARE:  - BENIGN COLONIC MUCOSA WITH SUPERFICIAL REACTIVE/INFLAMMATORY TYPE  CHANGES AND SMALL LYMPHOID AGGREGATE.  - NEGATIVE FOR DYSPLASIA AND MALIGNANCY.   C. RECTAL POLYP; COLD SNARE:  - TUBULAR ADENOMA.  - NEGATIVE FOR HIGH-GRADE DYSPLASIA AND MALIGNANCY.  No known family history of GI malignancy  Past Medical History:  Diagnosis Date   Anxiety    Depression     Past Surgical History:  Procedure Laterality Date   ESOPHAGOGASTRODUODENOSCOPY (EGD) WITH PROPOFOL N/A 09/11/2020   Procedure: ESOPHAGOGASTRODUODENOSCOPY (EGD) WITH PROPOFOL;  Surgeon: Lin Landsman, MD;  Location: [facility] ENDOSCOPY;  Service: Gastroenterology;  Laterality: N/A;   FLEXIBLE SIGMOIDOSCOPY N/A 09/11/2020   Procedure: FLEXIBLE SIGMOIDOSCOPY;  Surgeon: Lin Landsman, MD;  Location: [facility] ENDOSCOPY;  Service: Gastroenterology;  Laterality: N/A;    Current Outpatient Medications:    DULoxetine (CYMBALTA) 20 MG capsule, 1 pill every other day for 1  week, then increase to 1 pill daily, Disp: 30 capsule, Rfl: 3   omeprazole (PRILOSEC) 20 MG capsule, Take 1 capsule (20 mg total) by mouth daily., Disp: 30 capsule, Rfl: 3   Family History  Problem Relation Age of Onset   Autoimmune disease Father    Autoimmune disease Sister    Autoimmune disease  Brother      Social History   Tobacco Use   Smoking status: Never   Smokeless tobacco: Never  Vaping Use   Vaping Use: Some days  Substance Use Topics   Alcohol use: Yes    Comment: Socially   Drug use: Yes    Types: Marijuana    Comment: ocassional     Allergies as of 11/28/2020 - Review Complete 11/28/2020  Allergen Reaction Noted   Lexapro [escitalopram]  11/19/2020   Wellbutrin [bupropion] Other (See Comments) 09/10/2020    Review of Systems:    All systems reviewed and negative except where noted in HPI.   Physical Exam:  BP (!) 158/95 (BP Location: Left Arm, Patient Position: Sitting, Cuff Size: Normal)   Pulse 93   Temp 98.5 F (36.9 C) (Oral)   Ht 6' (1.829 m)   Wt 263 lb 4 oz (119.4 kg)   BMI 35.70 kg/m  No LMP for male patient.  General:   Alert,  Well-developed, well-nourished, pleasant and cooperative in NAD Head:  Normocephalic and atraumatic. Eyes:  Sclera clear, no icterus.   Conjunctiva pink. Ears:  Normal auditory acuity. Nose:  No deformity, discharge, or lesions. Mouth:  No deformity or lesions,oropharynx pink & moist. Neck:  Supple; no masses or thyromegaly. Lungs:  Respirations even and unlabored.  Clear throughout to auscultation.   No wheezes, crackles, or rhonchi. No acute distress. Heart:  Regular rate and rhythm; no murmurs, clicks, rubs, or gallops. Abdomen:  Normal bowel sounds. Soft, non-tender and non-distended without masses, hepatosplenomegaly or hernias noted.  No guarding or rebound tenderness.   Rectal: Not performed Msk:  Symmetrical without gross deformities. Good, equal movement & strength bilaterally. Pulses:  Normal pulses noted. Extremities:  No clubbing or edema.  No cyanosis. Neurologic:  Alert and oriented x3;  grossly normal neurologically. Skin:  Intact without significant lesions or rashes. No jaundice. Psych:  Alert and cooperative. Normal mood and affect.  Imaging Studies: None  Assessment and Plan:   Jacob [name] is a 30 y.o. male with BMI 36, history of depression is seen in consultation for new onset of constipation, chronic GERD and abnormal LFTs.  Normal TSH  Chronic constipation and fullness in the rectum Reiterated to continue high-fiber diet and adequate intake of water Okay to take MiraLAX as needed Flexible sigmoidoscopy did not reveal any obstructive lesions  Chronic GERD: No evidence of Barrett's esophagus, symptoms are under control Upper endoscopy did not reveal erosive esophagitis Continue antireflux lifestyle, information provided Continue omeprazole 20 mg daily before breakfast, okay to continue long-term  Abnormal LFTs, most likely secondary to fatty liver Right upper quadrant ultrasound revealed normal liver and gallbladder Recommend to check viral hepatitis panel today  Tubular adenoma of the colon on flexible sigmoidoscopy Have discussed with patient regarding colonoscopy to evaluate for polyps in the right colon.  Patient is trying to pay the bill for sigmoidoscopy and endoscopy.  He said he will call our office to schedule colonoscopy   Follow up as needed   Cephas Darby, MD

## 2020-11-29 LAB — HCV INTERPRETATION

## 2020-11-29 LAB — VIRAL HEPATITIS HBV, HCV
HCV Ab: 0.1 s/co ratio (ref 0.0–0.9)
Hep B Core Total Ab: NEGATIVE
Hep B Surface Ab, Qual: NONREACTIVE
Hepatitis B Surface Ag: NEGATIVE

## 2020-12-03 ENCOUNTER — Telehealth: Payer: Self-pay

## 2020-12-03 DIAGNOSIS — R7989 Other specified abnormal findings of blood chemistry: Secondary | ICD-10-CM

## 2020-12-03 NOTE — Telephone Encounter (Signed)
Called and left a message. Sent [employer]. Order LFTS

## 2020-12-03 NOTE — Telephone Encounter (Signed)
Patient read my chart message

## 2020-12-03 NOTE — Telephone Encounter (Signed)
-----   Message from Lin Landsman, MD sent at 12/02/2020  1:16 PM EDT ----- Hepatitis panel negative, recommend to recheck LFTs now. If persistently elevated, will perform secondary liver disease work up  RV

## 2020-12-12 ENCOUNTER — Other Ambulatory Visit: Payer: Self-pay | Admitting: Family Medicine

## 2020-12-12 NOTE — Telephone Encounter (Signed)
  Notes to clinic:  REQUEST FOR 90 DAYS PRESCRIPTION   Requested Prescriptions  Pending Prescriptions Disp Refills   DULoxetine (CYMBALTA) 20 MG capsule [Pharmacy Med Name: DULOXETINE HCL DR 20 MG CAP] 90 capsule 2    Sig: 1 CAP EVERY OTHER DAY FOR 1 WEEK, THEN INCREASE TO 1 CAPSULE BY MOUTH ONCE DAILY      Psychiatry: Antidepressants - SNRI Failed - 12/12/2020 11:32 AM      Failed - Last BP in normal range    BP Readings from Last 1 Encounters:  11/28/20 (!) 158/95          Passed - Completed PHQ-2 or PHQ-9 in the last 360 days      Passed - Valid encounter within last 6 months    Recent Outpatient Visits           3 weeks ago Moderate episode of recurrent major depressive disorder ([place])   [place], Megan P, DO   2 months ago Moderate episode of recurrent major depressive disorder ([place])   [place], Megan P, DO   3 months ago Moderate episode of recurrent major depressive disorder ([place])   [place], Megan P, DO   3 months ago Moderate episode of recurrent major depressive disorder ([place])   [place], DO   1 year ago Headache associated with sexual activity   [place], [place], DO       Future Appointments             In 6 days Wynetta Emery, Barb Merino, DO [employer], [facility]

## 2020-12-12 NOTE — Telephone Encounter (Signed)
Pt is scheduled 7/27

## 2020-12-18 ENCOUNTER — Telehealth: Payer: BC Managed Care – PPO | Admitting: Family Medicine

## 2020-12-18 ENCOUNTER — Encounter: Payer: Self-pay | Admitting: Family Medicine

## 2020-12-18 DIAGNOSIS — F331 Major depressive disorder, recurrent, moderate: Secondary | ICD-10-CM | POA: Diagnosis not present

## 2020-12-18 MED ORDER — DULOXETINE HCL 30 MG PO CPEP: 30.0000 mg | ORAL_CAPSULE | Freq: Every day | ORAL | 0 refills | Status: DC

## 2020-12-18 NOTE — Assessment & Plan Note (Addendum)
Doing better but not quite there yet. Will increase cymbalta to '30mg'$  and recheck 3 months. Call with any concerns.

## 2020-12-18 NOTE — Progress Notes (Signed)
There were no vitals taken for this visit.   Subjective:    Patient ID: Jacob [name], male    DOB: 06-13-1990, 30 y.o.   MRN: [id number]  HPI: Jacob [name] is a 30 y.o. male  Chief Complaint  Patient presents with   Depression    4 week f/up    DEPRESSION Mood status: better Satisfied with current treatment?: yes Symptom severity: mild  Duration of current treatment : mild Side effects: for the first week- resolved now Medication compliance: good compliance Psychotherapy/counseling: no  Depressed mood: yes Anxious mood: yes Anhedonia: no Significant weight loss or gain: no Insomnia: no  Fatigue: yes Feelings of worthlessness or guilt: yes Impaired concentration/indecisiveness: no Suicidal ideations: no Hopelessness: no Crying spells: no Depression screen [facility] 2/9 12/18/2020 11/19/2020 10/01/2020 09/10/2020 08/22/2020  Decreased Interest '1 3 1 1 3  '$ Down, Depressed, Hopeless 0 '2 1 1 3  '$ PHQ - 2 Score '1 5 2 2 6  '$ Altered sleeping 0 0 2 3 0  Tired, decreased energy '1 3 3 1 3  '$ Change in appetite 1 3 0 3 3  Feeling bad or failure about yourself  0 1 0 1 2  Trouble concentrating '1 1 2 2 2  '$ Moving slowly or fidgety/restless 0 1 0 0 1  Suicidal thoughts 0 1 0 0 1  PHQ-9 Score '4 15 9 12 18  '$ Difficult doing work/chores Somewhat difficult Very difficult Somewhat difficult Somewhat difficult Somewhat difficult  Some recent data might be hidden   GAD 7 : Generalized Anxiety Score 12/18/2020 10/01/2020 09/10/2020 08/22/2020  Nervous, Anxious, on Edge 0 '1 1 3  '$ Control/stop worrying 0 '1 2 2  '$ Worry too much - different things 0 0 1 3  Trouble relaxing 0 '1 1 3  '$ Restless '2 1 1 '$ 0  Easily annoyed or irritable 1 0 0 1  Afraid - awful might happen 0 '1 1 1  '$ Total GAD 7 Score '3 5 7 13  '$ Anxiety Difficulty Somewhat difficult Not difficult at all Somewhat difficult Somewhat difficult   Relevant past medical, surgical, family and social history reviewed and updated as indicated. Interim medical  history since our last visit reviewed. Allergies and medications reviewed and updated.  Review of Systems  Constitutional: Negative.   Respiratory: Negative.    Cardiovascular: Negative.   Gastrointestinal: Negative.   Skin: Negative.   Neurological: Negative.   Psychiatric/Behavioral:  Positive for dysphoric mood. Negative for agitation, behavioral problems, confusion, decreased concentration, hallucinations, self-injury, sleep disturbance and suicidal ideas. The patient is nervous/anxious. The patient is not hyperactive.    Per HPI unless specifically indicated above     Objective:    There were no vitals taken for this visit.  Wt Readings from Last 3 Encounters:  11/28/20 263 lb 4 oz (119.4 kg)  10/01/20 257 lb (116.6 kg)  09/11/20 261 lb (118.4 kg)    Physical Exam Vitals and nursing note reviewed.  Constitutional:      General: He is not in acute distress.    Appearance: Normal appearance. He is not ill-appearing, toxic-appearing or diaphoretic.  HENT:     Head: Normocephalic and atraumatic.     Right Ear: External ear normal.     Left Ear: External ear normal.     Nose: Nose normal.     Mouth/Throat:     Mouth: Mucous membranes are moist.     Pharynx: Oropharynx is clear.  Eyes:     General: No scleral icterus.  Right eye: No discharge.        Left eye: No discharge.     Conjunctiva/sclera: Conjunctivae normal.     Pupils: Pupils are equal, round, and reactive to light.  Pulmonary:     Effort: Pulmonary effort is normal. No respiratory distress.     Comments: Speaking in full sentences Musculoskeletal:        General: Normal range of motion.     Cervical back: Normal range of motion.  Skin:    Coloration: Skin is not jaundiced or pale.     Findings: No bruising, erythema, lesion or rash.  Neurological:     Mental Status: He is alert and oriented to person, place, and time. Mental status is at baseline.  Psychiatric:        Mood and Affect: Mood normal.         Behavior: Behavior normal.        Thought Content: Thought content normal.        Judgment: Judgment normal.    Results for orders placed or performed in visit on 11/28/20  Viral Hepatitis HBV, HCV  Result Value Ref Range   Hepatitis B Surface Ag Negative Negative   Hep B Surface Ab, Qual Non Reactive    Hep B Core Total Ab Negative Negative   HCV Ab 0.1 0.0 - 0.9 s/co ratio  Interpretation:  Result Value Ref Range   HCV Interp 1: Comment       Assessment & Plan:   Problem List Items Addressed This Visit       Other   Depression - Primary    Doing better but not quite there yet. Will increase cymbalta to '30mg'$  and recheck 3 months. Call with any concerns.         Relevant Medications   DULoxetine (CYMBALTA) 30 MG capsule     Follow up plan: Return in about 3 months (around 03/20/2021).   This visit was completed via video visit through MyChart due to the restrictions of the COVID-19 pandemic. All issues as above were discussed and addressed. Physical exam was done as above through visual confirmation on video through MyChart. If it was felt that the patient should be evaluated in the office, they were directed there. The patient verbally consented to this visit. Location of the patient: home Location of the provider: work Those involved with this call:  Provider: Park Liter, DO CMA: Yvonna Alanis, [place] Desk/Registration: Jill Side  Time spent on call:  15 minutes with patient face to face via video conference. More than 50% of this time was spent in counseling and coordination of care. 23 minutes total spent in review of patient's record and preparation of their chart.

## 2021-02-07 ENCOUNTER — Other Ambulatory Visit: Payer: Self-pay | Admitting: Family Medicine

## 2021-02-07 NOTE — Telephone Encounter (Signed)
Requested medication (s) are due for refill today - no  Requested medication (s) are on the active medication list -no  Future visit scheduled -yes  Last refill: 12/10/20  Notes to clinic: Request RF: medication discontinued- no longer current on medication list  Requested Prescriptions  Pending Prescriptions Disp Refills   escitalopram (LEXAPRO) 5 MG tablet [Pharmacy Med Name: ESCITALOPRAM 5 MG TABLET] 30 tablet 1    Sig: TAKE 1/2 TABLET BY MOUTH DAILY     Psychiatry:  Antidepressants - SSRI Passed - 02/07/2021  1:20 AM      Passed - Completed PHQ-2 or PHQ-9 in the last 360 days      Passed - Valid encounter within last 6 months    Recent Outpatient Visits           1 month ago Moderate episode of recurrent major depressive disorder ([place])   [place], Megan P, DO   2 months ago Moderate episode of recurrent major depressive disorder ([place])   [place], Megan P, DO   4 months ago Moderate episode of recurrent major depressive disorder ([place])   [place], Megan P, DO   5 months ago Moderate episode of recurrent major depressive disorder ([place])   [place], Megan P, DO   5 months ago Moderate episode of recurrent major depressive disorder ([place])   [place], [place], DO       Future Appointments             In 1 month [place], Megan P, DO [place]               Requested Prescriptions  Pending Prescriptions Disp Refills   escitalopram (LEXAPRO) 5 MG tablet [Pharmacy Med Name: ESCITALOPRAM 5 MG TABLET] 30 tablet 1    Sig: TAKE 1/2 TABLET BY MOUTH DAILY     Psychiatry:  Antidepressants - SSRI Passed - 02/07/2021  1:20 AM      Passed - Completed PHQ-2 or PHQ-9 in the last 360 days      Passed - Valid encounter within last 6 months    Recent Outpatient Visits           1 month ago Moderate episode of recurrent major depressive  disorder ([place])   [place], Megan P, DO   2 months ago Moderate episode of recurrent major depressive disorder ([place])   [place], Megan P, DO   4 months ago Moderate episode of recurrent major depressive disorder ([place])   [place], Megan P, DO   5 months ago Moderate episode of recurrent major depressive disorder ([place])   [place] P, DO   5 months ago Moderate episode of recurrent major depressive disorder [facility])   [place], [place], DO       Future Appointments             In 1 month [place], Barb Merino, DO [facility], [facility]

## 2021-03-19 ENCOUNTER — Other Ambulatory Visit: Payer: Self-pay | Admitting: Family Medicine

## 2021-03-19 NOTE — Telephone Encounter (Signed)
Requested Prescriptions  Pending Prescriptions Disp Refills  . DULoxetine (CYMBALTA) 30 MG capsule [Pharmacy Med Name: DULOXETINE HCL DR 30 MG CAP] 90 capsule 0    Sig: TAKE 1 CAPSULE BY MOUTH EVERY DAY     Psychiatry: Antidepressants - SNRI Failed - 03/19/2021  1:20 AM      Failed - Last BP in normal range    BP Readings from Last 1 Encounters:  11/28/20 (!) 158/95         Passed - Completed PHQ-2 or PHQ-9 in the last 360 days      Passed - Valid encounter within last 6 months    Recent Outpatient Visits          3 months ago Moderate episode of recurrent major depressive disorder ([place])   [place], Megan P, DO   4 months ago Moderate episode of recurrent major depressive disorder ([place])   [place], Megan P, DO   5 months ago Moderate episode of recurrent major depressive disorder ([place])   [place], Megan P, DO   6 months ago Moderate episode of recurrent major depressive disorder ([place])   [place], Megan P, DO   6 months ago Moderate episode of recurrent major depressive disorder [facility])   [place], [place], DO      Future Appointments            In 5 days Wynetta Emery, Barb Merino, DO [facility], [facility]

## 2021-03-22 NOTE — Progress Notes (Signed)
There were no vitals taken for this visit.   Subjective:    Patient ID: Jacob [name], male    DOB: 11-06-1990, 30 y.o.   MRN: [id number]  HPI: Jacob [name] is a 30 y.o. male  Chief Complaint  Patient presents with   Depression   ANXIETY/DEPRESSION- cymbalta helps to keep away the worst of the depression, but it has not helped with the motivation. At the higher dose he notes that he had some brain fog, so he doesn't want to go up on his meds right now. He does note that both his mom and his grandma have ADD and he is wondering if he can be tested. Duration:better Anxious mood: yes  Excessive worrying: no Irritability: no  Sweating: no Nausea: no Palpitations:no Hyperventilation: no Panic attacks: no Agoraphobia: no  Obscessions/compulsions: no Depressed mood: yes Depression screen [facility] 2/9 03/24/2021 12/18/2020 11/19/2020 10/01/2020 09/10/2020  Decreased Interest 3 1 3 1 1   Down, Depressed, Hopeless 2 0 2 1 1   PHQ - 2 Score 5 1 5 2 2   Altered sleeping 1 0 0 2 3  Tired, decreased energy 3 1 3 3 1   Change in appetite 1 1 3  0 3  Feeling bad or failure about yourself  1 0 1 0 1  Trouble concentrating 1 1 1 2 2   Moving slowly or fidgety/restless 1 0 1 0 0  Suicidal thoughts 0 0 1 0 0  PHQ-9 Score 13 4 15 9 12   Difficult doing work/chores Very difficult Somewhat difficult Very difficult Somewhat difficult Somewhat difficult  Some recent data might be hidden   Anhedonia: no Weight changes: no Insomnia: no   Hypersomnia: no Fatigue/loss of energy: yes Feelings of worthlessness: yes Feelings of guilt: yes Impaired concentration/indecisiveness: no Suicidal ideations: no  Crying spells: no Recent Stressors/Life Changes: no   Relationship problems: no   Family stress: no     Financial stress: no    Job stress: no    Recent death/loss: no   Relevant past medical, surgical, family and social history reviewed and updated as indicated. Interim medical history since our last visit  reviewed. Allergies and medications reviewed and updated.  Review of Systems  Constitutional: Negative.   Respiratory: Negative.    Cardiovascular: Negative.   Skin: Negative.   Psychiatric/Behavioral:  Positive for decreased concentration and dysphoric mood. Negative for agitation, behavioral problems, confusion, hallucinations, self-injury, sleep disturbance and suicidal ideas. The patient is nervous/anxious. The patient is not hyperactive.    Per HPI unless specifically indicated above     Objective:    There were no vitals taken for this visit.  Wt Readings from Last 3 Encounters:  11/28/20 263 lb 4 oz (119.4 kg)  10/01/20 257 lb (116.6 kg)  09/11/20 261 lb (118.4 kg)    Physical Exam Vitals and nursing note reviewed.  Constitutional:      General: He is not in acute distress.    Appearance: Normal appearance. He is not ill-appearing, toxic-appearing or diaphoretic.  HENT:     Head: Normocephalic and atraumatic.     Right Ear: External ear normal.     Left Ear: External ear normal.     Nose: Nose normal.     Mouth/Throat:     Mouth: Mucous membranes are moist.     Pharynx: Oropharynx is clear.  Eyes:     General: No scleral icterus.       Right eye: No discharge.        Left  eye: No discharge.     Conjunctiva/sclera: Conjunctivae normal.     Pupils: Pupils are equal, round, and reactive to light.  Pulmonary:     Effort: Pulmonary effort is normal. No respiratory distress.     Comments: Speaking in full sentences Musculoskeletal:        General: Normal range of motion.     Cervical back: Normal range of motion.  Skin:    Coloration: Skin is not jaundiced or pale.     Findings: No bruising, erythema, lesion or rash.  Neurological:     Mental Status: He is alert and oriented to person, place, and time. Mental status is at baseline.  Psychiatric:        Mood and Affect: Mood normal.        Behavior: Behavior normal.        Thought Content: Thought content normal.         Judgment: Judgment normal.    Results for orders placed or performed in visit on 11/28/20  Viral Hepatitis HBV, HCV  Result Value Ref Range   Hepatitis B Surface Ag Negative Negative   Hep B Surface Ab, Qual Non Reactive    Hep B Core Total Ab Negative Negative   HCV Ab 0.1 0.0 - 0.9 s/co ratio  Interpretation:  Result Value Ref Range   HCV Interp 1: Comment       Assessment & Plan:   Problem List Items Addressed This Visit       Other   Depression    Stable on current regimen. Continue current regimen. Continue to monitor. Call with any concerns. Refills given. Information about abilify and rexalti provided. He will consider if starting to feel worse.       Relevant Medications   DULoxetine (CYMBALTA) 30 MG capsule   Other Relevant Orders   Ambulatory referral to Neuropsychology   GAD (generalized anxiety disorder) - Primary    Stable on current regimen. Continue current regimen. Continue to monitor. Call with any concerns. Refills given. Information about abilify and rexalti provided. He will consider if starting to feel worse.        Relevant Medications   DULoxetine (CYMBALTA) 30 MG capsule   Other Relevant Orders   Ambulatory referral to Neuropsychology   Other Visit Diagnoses     Concentration deficit       Will get him set up for neuropsych testing. Await results. Call with any concerns.    Relevant Orders   Ambulatory referral to Neuropsychology        Follow up plan: Return in about 3 months (around 06/24/2021).   This visit was completed via video visit through MyChart due to the restrictions of the COVID-19 pandemic. All issues as above were discussed and addressed. Physical exam was done as above through visual confirmation on video through MyChart. If it was felt that the patient should be evaluated in the office, they were directed there. The patient verbally consented to this visit. Location of the patient: home Location of the provider:  work Those involved with this call:  Provider: Park Liter, DO CMA: Louanna Raw, [place] Desk/Registration: Barth Kirks  Time spent on call:  15 minutes with patient face to face via video conference. More than 50% of this time was spent in counseling and coordination of care. 23 minutes total spent in review of patient's record and preparation of their chart.

## 2021-03-24 ENCOUNTER — Telehealth: Payer: BC Managed Care – PPO | Admitting: Family Medicine

## 2021-03-24 ENCOUNTER — Encounter: Payer: Self-pay | Admitting: Family Medicine

## 2021-03-24 DIAGNOSIS — R41840 Attention and concentration deficit: Secondary | ICD-10-CM | POA: Diagnosis not present

## 2021-03-24 DIAGNOSIS — F331 Major depressive disorder, recurrent, moderate: Secondary | ICD-10-CM | POA: Diagnosis not present

## 2021-03-24 DIAGNOSIS — F411 Generalized anxiety disorder: Secondary | ICD-10-CM

## 2021-03-24 MED ORDER — DULOXETINE HCL 30 MG PO CPEP: ORAL_CAPSULE | ORAL | 1 refills | Status: DC

## 2021-03-24 NOTE — Assessment & Plan Note (Signed)
Stable on current regimen. Continue current regimen. Continue to monitor. Call with any concerns. Refills given. Information about abilify and rexalti provided. He will consider if starting to feel worse.

## 2021-04-10 ENCOUNTER — Encounter: Payer: Self-pay | Admitting: Family Medicine

## 2021-04-11 ENCOUNTER — Encounter: Payer: Self-pay | Admitting: Family Medicine

## 2021-04-11 ENCOUNTER — Telehealth: Payer: BC Managed Care – PPO | Admitting: Family Medicine

## 2021-04-11 DIAGNOSIS — F410 Panic disorder [episodic paroxysmal anxiety] without agoraphobia: Secondary | ICD-10-CM | POA: Diagnosis not present

## 2021-04-11 MED ORDER — LORAZEPAM 0.5 MG PO TABS: 0.5000 mg | ORAL_TABLET | Freq: Two times a day (BID) | ORAL | 0 refills | Status: DC | PRN

## 2021-04-11 NOTE — Telephone Encounter (Signed)
appt

## 2021-04-11 NOTE — Progress Notes (Signed)
There were no vitals taken for this visit.   Subjective:    Patient ID: Jacob [name], male    DOB: 1990-09-29, 30 y.o.   MRN: [id number]  HPI: Jacob [name] is a 30 y.o. male  Chief Complaint  Patient presents with   Anxiety    Patient states he has been having more anxiety attacks    ANXIETY/STRESS Duration: 2 days Status:exacerbated Anxious mood: yes  Excessive worrying: yes Irritability: no  Sweating: no Nausea: yes Palpitations:yes Hyperventilation: no Panic attacks: yes Agoraphobia: no  Obscessions/compulsions: yes Depressed mood: yes Depression screen [facility] 2/9 04/11/2021 03/24/2021 12/18/2020 11/19/2020 10/01/2020  Decreased Interest 1 3 1 3 1   Down, Depressed, Hopeless 1 2 0 2 1  PHQ - 2 Score 2 5 1 5 2   Altered sleeping 0 1 0 0 2  Tired, decreased energy 2 3 1 3 3   Change in appetite 1 1 1 3  0  Feeling bad or failure about yourself  0 1 0 1 0  Trouble concentrating 2 1 1 1 2   Moving slowly or fidgety/restless 1 1 0 1 0  Suicidal thoughts 1 0 0 1 0  PHQ-9 Score 9 13 4 15 9   Difficult doing work/chores Somewhat difficult Very difficult Somewhat difficult Very difficult Somewhat difficult  Some recent data might be hidden   GAD 7 : Generalized Anxiety Score 04/11/2021 03/24/2021 12/18/2020 10/01/2020  Nervous, Anxious, on Edge 1 1 0 1  Control/stop worrying 1 1 0 1  Worry too much - different things 1 2 0 0  Trouble relaxing 1 1 0 1  Restless 2 2 2 1   Easily annoyed or irritable 1 2 1  0  Afraid - awful might happen 1 2 0 1  Total GAD 7 Score 8 11 3 5   Anxiety Difficulty Very difficult Not difficult at all Somewhat difficult Not difficult at all   Anhedonia: no Weight changes: no Insomnia: yes hard to fall asleep  Hypersomnia: no Fatigue/loss of energy: yes Feelings of worthlessness: no Feelings of guilt: no Impaired concentration/indecisiveness: no Suicidal ideations: no  Crying spells: no Recent Stressors/Life Changes: no   Relationship problems: no    Family stress: no     Financial stress: no    Job stress: no    Recent death/loss: no  Relevant past medical, surgical, family and social history reviewed and updated as indicated. Interim medical history since our last visit reviewed. Allergies and medications reviewed and updated.  Review of Systems  Constitutional: Negative.   HENT: Negative.    Respiratory: Negative.    Cardiovascular: Negative.   Gastrointestinal:  Positive for nausea. Negative for abdominal distention, abdominal pain, anal bleeding, blood in stool, constipation, diarrhea, rectal pain and vomiting.  Skin: Negative.   Psychiatric/Behavioral:  Positive for decreased concentration and dysphoric mood. Negative for agitation, behavioral problems, confusion, hallucinations, self-injury, sleep disturbance and suicidal ideas. The patient is nervous/anxious. The patient is not hyperactive.    Per HPI unless specifically indicated above     Objective:    There were no vitals taken for this visit.  Wt Readings from Last 3 Encounters:  11/28/20 263 lb 4 oz (119.4 kg)  10/01/20 257 lb (116.6 kg)  09/11/20 261 lb (118.4 kg)    Physical Exam Vitals and nursing note reviewed.  Constitutional:      General: He is not in acute distress.    Appearance: Normal appearance. He is not ill-appearing, toxic-appearing or diaphoretic.  HENT:     Head:  Normocephalic and atraumatic.     Right Ear: External ear normal.     Left Ear: External ear normal.     Nose: Nose normal.     Mouth/Throat:     Mouth: Mucous membranes are moist.     Pharynx: Oropharynx is clear.  Eyes:     General: No scleral icterus.       Right eye: No discharge.        Left eye: No discharge.     Conjunctiva/sclera: Conjunctivae normal.     Pupils: Pupils are equal, round, and reactive to light.  Pulmonary:     Effort: Pulmonary effort is normal. No respiratory distress.     Comments: Speaking in full sentences Musculoskeletal:        General: Normal  range of motion.     Cervical back: Normal range of motion.  Skin:    Coloration: Skin is not jaundiced or pale.     Findings: No bruising, erythema, lesion or rash.  Neurological:     Mental Status: He is alert and oriented to person, place, and time. Mental status is at baseline.  Psychiatric:        Mood and Affect: Mood normal.        Behavior: Behavior normal.        Thought Content: Thought content normal.        Judgment: Judgment normal.    Results for orders placed or performed in visit on 11/28/20  Viral Hepatitis HBV, HCV  Result Value Ref Range   Hepatitis B Surface Ag Negative Negative   Hep B Surface Ab, Qual Non Reactive    Hep B Core Total Ab Negative Negative   HCV Ab 0.1 0.0 - 0.9 s/co ratio  Interpretation:  Result Value Ref Range   HCV Interp 1: Comment       Assessment & Plan:   Problem List Items Addressed This Visit   None Visit Diagnoses     Panic attacks    -  Primary   Will check labs next week and treat panic attacks with lorazepam if needed. Call with any concerns. Recheck 2 weeks. Call with any concerns.    Relevant Medications   LORazepam (ATIVAN) 0.5 MG tablet   Other Relevant Orders   CBC with Differential/Platelet   Comprehensive metabolic panel   TSH   VITAMIN D 25 Hydroxy (Vit-D Deficiency, Fractures)        Follow up plan: Return 2-4 weeks follow up.    This visit was completed via video visit through MyChart due to the restrictions of the COVID-19 pandemic. All issues as above were discussed and addressed. Physical exam was done as above through visual confirmation on video through MyChart. If it was felt that the patient should be evaluated in the office, they were directed there. The patient verbally consented to this visit. Location of the patient: home Location of the provider: work Those involved with this call:  Provider: Park Liter, DO CMA: Louanna Raw, [place] Desk/Registration: Myrlene Broker  Time spent on  call:  15 minutes with patient face to face via video conference. More than 50% of this time was spent in counseling and coordination of care. 23 minutes total spent in review of patient's record and preparation of their chart.

## 2021-04-11 NOTE — Telephone Encounter (Signed)
Appointment scheduled for today 

## 2021-04-11 NOTE — Telephone Encounter (Signed)
OK for same day today

## 2021-04-15 ENCOUNTER — Telehealth: Payer: BC Managed Care – PPO | Admitting: Family Medicine

## 2021-04-15 NOTE — Progress Notes (Signed)
Lmom for patient to call and schedule f/u appointment.

## 2021-04-29 ENCOUNTER — Telehealth: Payer: Self-pay | Admitting: Family Medicine

## 2021-04-29 NOTE — Telephone Encounter (Signed)
Sent patient message via MyChart with Dr.Johnson's suggestion.

## 2021-04-29 NOTE — Telephone Encounter (Deleted)
Copied from [place] [id number]. Topic: General - Other >> Apr 29, 2021  2:29 PM Leward Quan A wrote: Reason for CRM: [name] referral called in to say that they were not able to schedule patient since they reached out to him multiple times with no answer or returned call. So referral will be closed today

## 2021-04-29 NOTE — Telephone Encounter (Signed)
Please reach out to patient and see if he still wants referral- mychart message may be best

## 2021-04-29 NOTE — Telephone Encounter (Signed)
Copied from [place] [id number]. Topic: General - Other >> Apr 29, 2021  2:29 PM Leward Quan A wrote: Reason for CRM: [facility] referral called in to say that they were not able to schedule patient since they reached out to him multiple times with no answer or returned call. So referral will be closed today

## 2021-04-30 ENCOUNTER — Encounter: Payer: Self-pay | Admitting: Gastroenterology

## 2021-05-01 ENCOUNTER — Other Ambulatory Visit: Payer: Self-pay

## 2021-05-01 DIAGNOSIS — Z86010 Personal history of colonic polyps: Secondary | ICD-10-CM

## 2021-05-01 MED ORDER — PEG 3350-KCL-NA BICARB-NACL 420 G PO SOLR: 4000.0000 mL | Freq: Once | ORAL | 0 refills | Status: AC

## 2021-05-01 NOTE — Progress Notes (Signed)
Gastroenterology Pre-Procedure Review  Request Date: 06/04/2021 Requesting Physician: Dr. Marius Ditch   PATIENT REVIEW QUESTIONS: The patient responded to the following health history questions as indicated:    1. Are you having any GI issues? no 2. Do you have a personal history of Polyps? yes (last colonoscopy) 3. Do you have a family history of Colon Cancer or Polyps? no 4. Diabetes Mellitus? no 5. Joint replacements in the past 12 months?no 6. Major health problems in the past 3 months?no 7. Any artificial heart valves, MVP, or defibrillator?no    MEDICATIONS & ALLERGIES:    Patient reports the following regarding taking any anticoagulation/antiplatelet therapy:   Plavix, Coumadin, Eliquis, Xarelto, Lovenox, Pradaxa, Brilinta, or Effient? no Aspirin? no  Patient confirms/reports the following medications:  Current Outpatient Medications  Medication Sig Dispense Refill   DULoxetine (CYMBALTA) 30 MG capsule TAKE 1 CAPSULE BY MOUTH EVERY DAY 90 capsule 1   LORazepam (ATIVAN) 0.5 MG tablet Take 1 tablet (0.5 mg total) by mouth 2 (two) times daily as needed for anxiety. 20 tablet 0   omeprazole (PRILOSEC) 20 MG capsule Take 1 capsule (20 mg total) by mouth daily. 30 capsule 3   No current facility-administered medications for this visit.    Patient confirms/reports the following allergies:  Allergies  Allergen Reactions   Lexapro [Escitalopram]     Made patient dizzy   Wellbutrin [Bupropion] Other (See Comments)    Brain fog    No orders of the defined types were placed in this encounter.   AUTHORIZATION INFORMATION Primary Insurance: 1D#: Group #:  Secondary Insurance: 1D#: Group #:  SCHEDULE INFORMATION: Date: 001/03/2022 Time: Location: [facility]

## 2021-05-02 ENCOUNTER — Encounter: Payer: Self-pay | Admitting: Psychology

## 2021-05-02 ENCOUNTER — Other Ambulatory Visit: Payer: Self-pay | Admitting: Family Medicine

## 2021-05-02 DIAGNOSIS — F410 Panic disorder [episodic paroxysmal anxiety] without agoraphobia: Secondary | ICD-10-CM | POA: Diagnosis not present

## 2021-05-02 DIAGNOSIS — E559 Vitamin D deficiency, unspecified: Secondary | ICD-10-CM | POA: Diagnosis not present

## 2021-05-03 LAB — COMPREHENSIVE METABOLIC PANEL
ALT: 43 IU/L (ref 0–44)
AST: 23 IU/L (ref 0–40)
Albumin/Globulin Ratio: 2.4 — ABNORMAL HIGH (ref 1.2–2.2)
Albumin: 4.8 g/dL (ref 4.1–5.2)
Alkaline Phosphatase: 120 IU/L (ref 44–121)
BUN/Creatinine Ratio: 12 (ref 9–20)
BUN: 10 mg/dL (ref 6–20)
Bilirubin Total: 1.1 mg/dL (ref 0.0–1.2)
CO2: 24 mmol/L (ref 20–29)
Calcium: 10.2 mg/dL (ref 8.7–10.2)
Chloride: 99 mmol/L (ref 96–106)
Creatinine, Ser: 0.85 mg/dL (ref 0.76–1.27)
Globulin, Total: 2 g/dL (ref 1.5–4.5)
Glucose: 80 mg/dL (ref 70–99)
Potassium: 4.1 mmol/L (ref 3.5–5.2)
Sodium: 139 mmol/L (ref 134–144)
Total Protein: 6.8 g/dL (ref 6.0–8.5)
eGFR: 120 mL/min/{1.73_m2} (ref >59)

## 2021-05-03 LAB — CBC WITH DIFFERENTIAL/PLATELET
Basophils Absolute: 0 10*3/uL (ref 0.0–0.2)
Basos: 0 %
EOS (ABSOLUTE): 0.1 10*3/uL (ref 0.0–0.4)
Eos: 1 %
Hematocrit: 47.6 % (ref 37.5–51.0)
Hemoglobin: 17.3 g/dL (ref 13.0–17.7)
Immature Grans (Abs): 0 10*3/uL (ref 0.0–0.1)
Immature Granulocytes: 0 %
Lymphocytes Absolute: 1.5 10*3/uL (ref 0.7–3.1)
Lymphs: 23 %
MCH: 30.7 pg (ref 26.6–33.0)
MCHC: 36.3 g/dL — ABNORMAL HIGH (ref 31.5–35.7)
MCV: 85 fL (ref 79–97)
Monocytes Absolute: 0.4 10*3/uL (ref 0.1–0.9)
Monocytes: 5 %
Neutrophils Absolute: 4.9 10*3/uL (ref 1.4–7.0)
Neutrophils: 71 %
Platelets: 228 10*3/uL (ref 150–450)
RBC: 5.63 x10E6/uL (ref 4.14–5.80)
RDW: 11.9 % (ref 11.6–15.4)
WBC: 6.9 10*3/uL (ref 3.4–10.8)

## 2021-05-03 LAB — VITAMIN D 25 HYDROXY (VIT D DEFICIENCY, FRACTURES): Vit D, 25-Hydroxy: 30.3 ng/mL (ref 30.0–100.0)

## 2021-05-03 LAB — TSH: TSH: 1.28 u[IU]/mL (ref 0.450–4.500)

## 2021-05-27 ENCOUNTER — Encounter: Payer: Self-pay | Admitting: Family Medicine

## 2021-05-27 ENCOUNTER — Ambulatory Visit: Payer: BC Managed Care – PPO | Admitting: Family Medicine

## 2021-05-27 ENCOUNTER — Other Ambulatory Visit: Payer: Self-pay | Admitting: Family Medicine

## 2021-05-27 ENCOUNTER — Other Ambulatory Visit: Payer: Self-pay

## 2021-05-27 VITALS — BP 169/78 | HR 121 | Temp 98.2°F | Wt 266.0 lb

## 2021-05-27 DIAGNOSIS — F411 Generalized anxiety disorder: Secondary | ICD-10-CM

## 2021-05-27 DIAGNOSIS — R002 Palpitations: Secondary | ICD-10-CM

## 2021-05-27 MED ORDER — DULOXETINE HCL 30 MG PO CPEP: ORAL_CAPSULE | ORAL | 1 refills | Status: DC

## 2021-05-27 MED ORDER — METOPROLOL SUCCINATE ER 25 MG PO TB24: 12.5000 mg | ORAL_TABLET | Freq: Every day | ORAL | 3 refills | Status: DC

## 2021-05-27 NOTE — Progress Notes (Signed)
BP (!) 169/78    Pulse (!) 121    Temp 98.2 F (36.8 C)    Wt 266 lb (120.7 kg)    SpO2 99%    BMI 36.08 kg/m    Subjective:    Patient ID: Jacob [name], male    DOB: June 23, 1990, 31 y.o.   MRN: [id number]  HPI: Jacob [name] is a 31 y.o. male  Chief Complaint  Patient presents with   Palpitations    Patient states he has noticed an increased amount of palpitations in the past few months   PALPITATIONS Duration: chronic but worse in the last couple of months Symptom description: heart racing and occasionally has "big beat" Duration of episode: seconds Frequency: recurrentl Activity when event occurred: rest Related to exertion: no Dyspnea: yes Chest pain: yes- pinching or straining, occasionally Syncope: no Anxiety/stress: yes Nausea/vomiting: yes Diaphoresis: no Coronary artery disease: no Congestive heart failure: no Arrhythmia:no Thyroid disease: no Caffeine intake: Heavy caffeine consumption Status:  worse Treatments attempted:cutting caffeine  ANXIETY/STRESS- got significantly worse with caffeine intake Duration:uncontrolled Anxious mood: yes  Excessive worrying: yes Irritability: yes  Sweating: yes Nausea: yes Palpitations:yes Hyperventilation: yes Panic attacks: yes Agoraphobia: no  Obscessions/compulsions: no Depressed mood: yes Depression screen [facility] 2/9 05/27/2021 04/11/2021 03/24/2021 12/18/2020 11/19/2020  Decreased Interest _0 Down, Depressed, Hopeless _1 0 2  PHQ - 2 Score _2 Altered sleeping 1 0 1 0 0  Tired, decreased energy _3 Change in appetite _4 Feeling bad or failure about yourself  0 0 1 0 1  Trouble concentrating _5 Moving slowly or fidgety/restless _6 0 1  Suicidal thoughts 0 1 0 0 1  PHQ-9 Score _7 Difficult doing work/chores - Somewhat difficult Very difficult Somewhat difficult Very difficult  Some recent data might be hidden   Anhedonia: no Weight changes: no Insomnia: no    Hypersomnia: no Fatigue/loss of energy: yes Feelings of worthlessness: no Feelings of guilt: no Impaired concentration/indecisiveness: no Suicidal ideations: no  Crying spells: no Recent Stressors/Life Changes: no   Relationship problems: no   Family stress: no     Financial stress: no    Job stress: no    Recent death/loss: no  Relevant past medical, surgical, family and social history reviewed and updated as indicated. Interim medical history since our last visit reviewed. Allergies and medications reviewed and updated.  Review of Systems  Constitutional: Negative.   Respiratory: Negative.  Negative for apnea, cough, choking, chest tightness, shortness of breath, wheezing and stridor.   Cardiovascular:  Positive for chest pain and palpitations. Negative for leg swelling.  Gastrointestinal:  Positive for nausea and vomiting. Negative for abdominal distention, abdominal pain, anal bleeding, blood in stool, constipation, diarrhea and rectal pain.  Skin: Negative.   Neurological: Negative.   Psychiatric/Behavioral:  Positive for dysphoric mood. Negative for agitation, behavioral problems, confusion, decreased concentration, hallucinations, self-injury, sleep disturbance and suicidal ideas. The patient is nervous/anxious. The patient is not hyperactive.    Per HPI unless specifically indicated above     Objective:    BP (!) 169/78    Pulse (!) 121    Temp 98.2 F (36.8 C)    Wt 266 lb (120.7 kg)    SpO2 99%    BMI 36.08 kg/m   Wt Readings from Last 3 Encounters:  05/27/21 266 lb (120.7 kg)  11/28/20 263 lb 4 oz (119.4 kg)  10/01/20 257 lb (116.6 kg)    Physical Exam Vitals and nursing note reviewed.  Constitutional:      General: He is not in acute distress.    Appearance: Normal appearance. He is not ill-appearing, toxic-appearing or diaphoretic.  HENT:     Head: Normocephalic and atraumatic.     Right Ear: External ear normal.     Left Ear: External ear normal.      Nose: Nose normal.     Mouth/Throat:     Mouth: Mucous membranes are moist.     Pharynx: Oropharynx is clear.  Eyes:     General: No scleral icterus.       Right eye: No discharge.        Left eye: No discharge.     Extraocular Movements: Extraocular movements intact.     Conjunctiva/sclera: Conjunctivae normal.     Pupils: Pupils are equal, round, and reactive to light.  Cardiovascular:     Rate and Rhythm: Normal rate and regular rhythm.     Pulses: Normal pulses.     Heart sounds: Normal heart sounds. No murmur heard.   No friction rub. No gallop.  Pulmonary:     Effort: Pulmonary effort is normal. No respiratory distress.     Breath sounds: Normal breath sounds. No stridor. No wheezing, rhonchi or rales.  Chest:     Chest wall: No tenderness.  Musculoskeletal:        General: Normal range of motion.     Cervical back: Normal range of motion and neck supple.  Skin:    General: Skin is warm and dry.     Capillary Refill: Capillary refill takes less than 2 seconds.     Coloration: Skin is not jaundiced or pale.     Findings: No bruising, erythema, lesion or rash.  Neurological:     General: No focal deficit present.     Mental Status: He is alert and oriented to person, place, and time. Mental status is at baseline.  Psychiatric:        Mood and Affect: Mood normal.        Behavior: Behavior normal.        Thought Content: Thought content normal.        Judgment: Judgment normal.    Results for orders placed or performed in visit on 05/02/21  CBC with Differential/Platelet  Result Value Ref Range   WBC 6.9 3.4 - 10.8 x10E3/uL   RBC 5.63 4.14 - 5.80 x10E6/uL   Hemoglobin 17.3 13.0 - 17.7 g/dL   Hematocrit 47.6 37.5 - 51.0 %   MCV 85 79 - 97 fL   MCH 30.7 26.6 - 33.0 pg   MCHC 36.3 (H) 31.5 - 35.7 g/dL   RDW 11.9 11.6 - 15.4 %   Platelets 228 150 - 450 x10E3/uL   Neutrophils 71 Not Estab. %   Lymphs 23 Not Estab. %   Monocytes 5 Not Estab. %   Eos 1 Not Estab. %    Basos 0 Not Estab. %   Neutrophils Absolute 4.9 1.4 - 7.0 x10E3/uL   Lymphocytes Absolute 1.5 0.7 - 3.1 x10E3/uL   Monocytes Absolute 0.4 0.1 - 0.9 x10E3/uL   EOS (ABSOLUTE) 0.1 0.0 - 0.4 x10E3/uL   Basophils Absolute 0.0 0.0 - 0.2 x10E3/uL   Immature Granulocytes 0 Not Estab. %   Immature Grans (Abs) 0.0 0.0 - 0.1 x10E3/uL  Comprehensive metabolic panel  Result Value Ref Range   Glucose 80 70 - 99 mg/dL   BUN 10 6 - 20 mg/dL   Creatinine, Ser 0.85 0.76 - 1.27 mg/dL   eGFR 120 >59 mL/min/1.73   BUN/Creatinine Ratio 12 9 - 20   Sodium 139 134 - 144 mmol/L   Potassium 4.1 3.5 - 5.2 mmol/L   Chloride 99 96 - 106 mmol/L   CO2 24 20 - 29 mmol/L   Calcium 10.2 8.7 - 10.2 mg/dL   Total Protein 6.8 6.0 - 8.5 g/dL   Albumin 4.8 4.1 - 5.2 g/dL   Globulin, Total 2.0 1.5 - 4.5 g/dL   Albumin/Globulin Ratio 2.4 (H) 1.2 - 2.2   Bilirubin Total 1.1 0.0 - 1.2 mg/dL   Alkaline Phosphatase 120 44 - 121 IU/L   AST 23 0 - 40 IU/L   ALT 43 0 - 44 IU/L  TSH  Result Value Ref Range   TSH 1.280 0.450 - 4.500 uIU/mL  VITAMIN D 25 Hydroxy (Vit-D Deficiency, Fractures)  Result Value Ref Range   Vit D, 25-Hydroxy 30.3 30.0 - 100.0 ng/mL      Assessment & Plan:   Problem List Items Addressed This Visit       Other   GAD (generalized anxiety disorder)    Feels like it's worse with the cymbalta. Will stop and recheck 1 month. Call with any concerns.       Relevant Medications   DULoxetine (CYMBALTA) 30 MG capsule   Palpitations - Primary    Likely anxiety related. Will start low dose metoprolol and get him in for holter monitor. Call with any concerns. Continue to monitor.      Relevant Orders   EKG 12-Lead (Completed)   Ambulatory referral to Cardiology     Follow up plan: Return as scheduled.

## 2021-05-27 NOTE — Assessment & Plan Note (Signed)
Likely anxiety related. Will start low dose metoprolol and get him in for holter monitor. Call with any concerns. Continue to monitor.

## 2021-05-27 NOTE — Assessment & Plan Note (Signed)
Feels like it's worse with the cymbalta. Will stop and recheck 1 month. Call with any concerns.

## 2021-05-27 NOTE — Progress Notes (Signed)
Interpreted by me today NSR at 95bpm, no ST segment changes.

## 2021-05-28 NOTE — Telephone Encounter (Signed)
Requested medication (s) are due for refill today:   Yes  being weaned off of this.  Requested medication (s) are on the active medication list:   Yes  Future visit scheduled:   Yes   Last ordered: 05/27/2021 #90, 1 refill  Returned because pharmacy requesting a PA.    Requested Prescriptions  Pending Prescriptions Disp Refills   DULoxetine (CYMBALTA) 30 MG capsule [Pharmacy Med Name: DULOXETINE HCL DR 30 MG CAP] 90 capsule 1    Sig: TAKE 1 CAPSULE BY MOUTH EVERY OTHER DAY for 7 days, then 1 capsule every 3 days for 7 days, then stop     Psychiatry: Antidepressants - SNRI Failed - 05/27/2021  9:42 AM      Failed - Last BP in normal range    BP Readings from Last 1 Encounters:  05/27/21 (!) 169/78          Passed - Completed PHQ-2 or PHQ-9 in the last 360 days      Passed - Valid encounter within last 6 months    Recent Outpatient Visits           Yesterday Palpitations   [facility] [place], [place], DO   1 month ago Panic attacks   [place], Megan P, DO   2 months ago GAD (generalized anxiety disorder)   [place], Megan P, DO   5 months ago Moderate episode of recurrent major depressive disorder ([place])   [place], Megan P, DO   6 months ago Moderate episode of recurrent major depressive disorder [facility])   [place], Barb Merino, DO       Future Appointments             In 4 weeks Wynetta Emery, Barb Merino, DO [place], [facility]   In 1 month [place], Minette Brine, PsyD [facility], [facility]

## 2021-06-03 ENCOUNTER — Encounter: Payer: Self-pay | Admitting: Gastroenterology

## 2021-06-04 ENCOUNTER — Ambulatory Visit
Admission: RE | Admit: 2021-06-04 | Discharge: 2021-06-04 | Disposition: A | Discharge status: Home / Self Care | Payer: BC Managed Care – PPO | Attending: Gastroenterology | Admitting: Gastroenterology

## 2021-06-04 ENCOUNTER — Encounter: Payer: Self-pay | Admitting: Gastroenterology

## 2021-06-04 ENCOUNTER — Ambulatory Visit: Payer: BC Managed Care – PPO | Admitting: Anesthesiology

## 2021-06-04 ENCOUNTER — Encounter
Admission: RE | Disposition: A | Discharge status: Home / Self Care | Payer: Self-pay | Source: Home / Self Care | Attending: Gastroenterology

## 2021-06-04 DIAGNOSIS — Z09 Encounter for follow-up examination after completed treatment for conditions other than malignant neoplasm: Secondary | ICD-10-CM | POA: Diagnosis not present

## 2021-06-04 DIAGNOSIS — Z860100 Personal history of colon polyps, unspecified: Secondary | ICD-10-CM

## 2021-06-04 DIAGNOSIS — Z1211 Encounter for screening for malignant neoplasm of colon: Secondary | ICD-10-CM | POA: Diagnosis not present

## 2021-06-04 HISTORY — PX: COLONOSCOPY WITH PROPOFOL: SHX5780

## 2021-06-04 SURGERY — COLONOSCOPY WITH PROPOFOL
Anesthesia: General

## 2021-06-04 MED ORDER — PROPOFOL 10 MG/ML IV BOLUS
INTRAVENOUS | Status: AC
  Filled 2021-06-04: qty 20

## 2021-06-04 MED ORDER — PROPOFOL 500 MG/50ML IV EMUL
INTRAVENOUS | Status: AC
  Filled 2021-06-04: qty 50

## 2021-06-04 MED ORDER — LIDOCAINE HCL (PF) 2 % IJ SOLN
INTRAMUSCULAR | Status: AC
  Filled 2021-06-04: qty 5

## 2021-06-04 MED ORDER — LIDOCAINE HCL (CARDIAC) PF 100 MG/5ML IV SOSY
PREFILLED_SYRINGE | INTRAVENOUS | Status: DC | PRN
  Administered 2021-06-04: 50 mg via INTRAVENOUS

## 2021-06-04 MED ORDER — PROPOFOL 10 MG/ML IV BOLUS
INTRAVENOUS | Status: DC | PRN
  Administered 2021-06-04: 100 mg via INTRAVENOUS

## 2021-06-04 MED ORDER — MIDAZOLAM HCL 2 MG/2ML IJ SOLN
INTRAMUSCULAR | Status: AC
  Filled 2021-06-04: qty 2

## 2021-06-04 MED ORDER — PHENYLEPHRINE HCL-NACL 20-0.9 MG/250ML-% IV SOLN
INTRAVENOUS | Status: AC
  Filled 2021-06-04: qty 250

## 2021-06-04 MED ORDER — PROPOFOL 500 MG/50ML IV EMUL
INTRAVENOUS | Status: DC | PRN
  Administered 2021-06-04: 200 ug/kg/min via INTRAVENOUS

## 2021-06-04 MED ORDER — DEXMEDETOMIDINE HCL IN NACL 200 MCG/50ML IV SOLN
INTRAVENOUS | Status: DC | PRN
  Administered 2021-06-04: 12 ug via INTRAVENOUS
  Administered 2021-06-04: 8 ug via INTRAVENOUS

## 2021-06-04 MED ORDER — SODIUM CHLORIDE 0.9 % IV SOLN: INTRAVENOUS | Status: DC

## 2021-06-04 MED ORDER — STERILE WATER FOR IRRIGATION IR SOLN
Status: DC | PRN
  Administered 2021-06-04: 360 mL

## 2021-06-04 MED ORDER — MIDAZOLAM HCL 2 MG/2ML IJ SOLN
INTRAMUSCULAR | Status: DC | PRN
  Administered 2021-06-04: 2 mg via INTRAVENOUS

## 2021-06-04 MED ORDER — DEXMEDETOMIDINE HCL IN NACL 200 MCG/50ML IV SOLN
INTRAVENOUS | Status: AC
  Filled 2021-06-04: qty 50

## 2021-06-04 NOTE — Op Note (Signed)
[facility] Gastroenterology Patient Name: Jacob [name] Procedure Date: 06/04/2021 8:14 AM MRN: [id number] Account #: [id number] Date of Birth: [birth year] Admit Type: Outpatient Age: 31 Room: [facility] ENDO ROOM 4 Gender: Male Note Status: Finalized Instrument Name: Jasper Riling [id number] Procedure:             Colonoscopy Indications:           High risk colon cancer surveillance: Personal history                         of adenoma less than 10 mm in size, on sigmoidoscopy Providers:             Lin Landsman MD, MD Referring MD:          Valerie Roys (Referring MD) Medicines:             General Anesthesia Complications:         No immediate complications. Estimated blood loss: None. Procedure:             Pre-Anesthesia Assessment:                        - Prior to the procedure, a History and Physical was                         performed, and patient medications and allergies were                         reviewed. The patient is competent. The risks and                         benefits of the procedure and the sedation options and                         risks were discussed with the patient. All questions                         were answered and informed consent was obtained.                         Patient identification and proposed procedure were                         verified by the physician, the nurse, the                         anesthesiologist, the anesthetist and the technician                         in the pre-procedure area in the procedure room in the                         endoscopy suite. Mental Status Examination: alert and                         oriented. Airway Examination: normal oropharyngeal                         airway and neck mobility. Respiratory Examination:  clear to auscultation. CV Examination: normal.                         Prophylactic Antibiotics: The patient does not require                          prophylactic antibiotics. Prior Anticoagulants: The                         patient has taken no previous anticoagulant or                         antiplatelet agents. ASA Grade Assessment: II - A                         patient with mild systemic disease. After reviewing                         the risks and benefits, the patient was deemed in                         satisfactory condition to undergo the procedure. The                         anesthesia plan was to use general anesthesia.                         Immediately prior to administration of medications,                         the patient was re-assessed for adequacy to receive                         sedatives. The heart rate, respiratory rate, oxygen                         saturations, blood pressure, adequacy of pulmonary                         ventilation, and response to care were monitored                         throughout the procedure. The physical status of the                         patient was re-assessed after the procedure.                        After obtaining informed consent, the colonoscope was                         passed under direct vision. Throughout the procedure,                         the patient's blood pressure, pulse, and oxygen                         saturations were monitored continuously. The  Colonoscope was introduced through the anus and                         advanced to the the terminal ileum, with                         identification of the appendiceal orifice and IC                         valve. The colonoscopy was performed without                         difficulty. The patient tolerated the procedure well.                         The quality of the bowel preparation was evaluated                         using the BBPS [facility] Bowel Preparation Scale) with                         scores of: Right Colon = 2 (minor amount of residual                          staining, small fragments of stool and/or opaque                         liquid, but mucosa seen well), Transverse Colon = 2                         (minor amount of residual staining, small fragments of                         stool and/or opaque liquid, but mucosa seen well) and                         Left Colon = 2 (minor amount of residual staining,                         small fragments of stool and/or opaque liquid, but                         mucosa seen well). The total BBPS score equals 6. Findings:      The perianal and digital rectal examinations were normal. Pertinent       negatives include normal sphincter tone and no palpable rectal lesions.      The terminal ileum appeared normal.      The entire examined colon appeared normal.      The retroflexed view of the distal rectum and anal verge was normal and       showed no anal or rectal abnormalities. Impression:            - The examined portion of the ileum was normal.                        - The entire examined colon is normal.                        -  The distal rectum and anal verge are normal on                         retroflexion view.                        - No specimens collected. Recommendation:        - Discharge patient to home (with escort).                        - Resume previous diet today.                        - Continue present medications.                        - Repeat colonoscopy in 10 years for screening                         purposes. Procedure Code(s):     --- Professional ---                        A2130, Colorectal cancer screening; colonoscopy on                         individual at high risk Diagnosis Code(s):     --- Professional ---                        Z86.010, Personal history of colonic polyps CPT copyright 2019 American Medical Association. All rights reserved. The codes documented in this report are preliminary and upon coder review may  be revised to meet current  compliance requirements. Dr. Ulyess Mort Lin Landsman MD, MD 06/04/2021 8:40:39 AM This report has been signed electronically. Number of Addenda: 0 Note Initiated On: 06/04/2021 8:14 AM Scope Withdrawal Time: 0 hours 13 minutes 3 seconds  Total Procedure Duration: 0 hours 16 minutes 11 seconds  Estimated Blood Loss:  Estimated blood loss: none.      [facility]

## 2021-06-04 NOTE — H&P (Signed)
Jacob Darby, MD [address]  [place]  [place], [place] [address]  Main: [phone]  Fax: [phone] Pager: [phone]  Primary Care Physician:  Jacob Roys, DO Primary Gastroenterologist:  Dr. Cephas [name]  Pre-Procedure History & Physical: HPI:  Jacob [name] is a 31 y.o. male is here for an colonoscopy.   Past Medical History:  Diagnosis Date   Anxiety    Depression     Past Surgical History:  Procedure Laterality Date   ESOPHAGOGASTRODUODENOSCOPY (EGD) WITH PROPOFOL N/A 09/11/2020   Procedure: ESOPHAGOGASTRODUODENOSCOPY (EGD) WITH PROPOFOL;  Surgeon: Lin Landsman, MD;  Location: [facility] ENDOSCOPY;  Service: Gastroenterology;  Laterality: N/A;   FLEXIBLE SIGMOIDOSCOPY N/A 09/11/2020   Procedure: FLEXIBLE SIGMOIDOSCOPY;  Surgeon: Lin Landsman, MD;  Location: [facility] ENDOSCOPY;  Service: Gastroenterology;  Laterality: N/A;    Prior to Admission medications   Medication Sig Start Date End Date Taking? Authorizing Provider  LORazepam (ATIVAN) 0.5 MG tablet Take 1 tablet (0.5 mg total) by mouth 2 (two) times daily as needed for anxiety. 04/11/21  Yes Johnson, Megan P, DO  metoprolol succinate (TOPROL-XL) 25 MG 24 hr tablet Take 0.5 tablets (12.5 mg total) by mouth daily. 05/27/21  Yes Johnson, Megan P, DO  omeprazole (PRILOSEC) 20 MG capsule Take 1 capsule (20 mg total) by mouth daily. 08/22/20  Yes Johnson, Megan P, DO  DULoxetine (CYMBALTA) 30 MG capsule TAKE 1 CAPSULE BY MOUTH EVERY OTHER DAY for 7 days, then 1 capsule every 3 days for 7 days, then stop 05/27/21   Park Liter P, DO    Allergies as of 05/01/2021 - Review Complete 05/01/2021  Allergen Reaction Noted   Lexapro [escitalopram]  11/19/2020   Wellbutrin [bupropion] Other (See Comments) 09/10/2020    Family History  Problem Relation Age of Onset   Diabetes Mother    Autoimmune disease Father    Autoimmune disease Sister    Autoimmune disease Brother     Social History    Socioeconomic History   Marital status: Soil scientist    Spouse name: Not on file   Number of children: Not on file   Years of education: Not on file   Highest education level: Not on file  Occupational History   Not on file  Tobacco Use   Smoking status: Never   Smokeless tobacco: Never  Vaping Use   Vaping Use: Some days  Substance and Sexual Activity   Alcohol use: Yes    Comment: Socially   Drug use: Yes    Types: Marijuana    Comment: daily   Sexual activity: Yes  Other Topics Concern   Not on file  Social History Narrative   Not on file   Social Determinants of Health   Financial Resource Strain: Not on file  Food Insecurity: Not on file  Transportation Needs: Not on file  Physical Activity: Not on file  Stress: Not on file  Social Connections: Not on file  Intimate Partner Violence: Not on file    Review of Systems: See HPI, otherwise negative ROS  Physical Exam: BP (!) 166/95    Pulse 95    Temp 97.7 F (36.5 C) (Temporal)    Resp 18    Ht 6' (1.829 m)    Wt 117.9 kg    SpO2 100%    BMI 35.26 kg/m  General:   Alert,  pleasant and cooperative in NAD Head:  Normocephalic and atraumatic. Neck:  Supple; no masses or thyromegaly. Lungs:  Clear throughout to auscultation.    Heart:  Regular rate and rhythm. Abdomen:  Soft, nontender and nondistended. Normal bowel sounds, without guarding, and without rebound.   Neurologic:  Alert and  oriented x4;  grossly normal neurologically.  Impression/Plan: Jacob [name] is here for an colonoscopy to be performed for h/o adenoma colon  Risks, benefits, limitations, and alternatives regarding  colonoscopy have been reviewed with the patient.  Questions have been answered.  All parties agreeable.   Sherri Sear, MD  06/04/2021, 8:12 AM

## 2021-06-04 NOTE — Transfer of Care (Signed)
Immediate Anesthesia Transfer of Care Note  Patient: Jacob [name]  Procedure(s) Performed: COLONOSCOPY WITH PROPOFOL  Patient Location: PACU and Endoscopy Unit  Anesthesia Type:General  Level of Consciousness: drowsy and patient cooperative  Airway & Oxygen Therapy: Patient Spontanous Breathing  Post-op Assessment: Report given to RN and Post -op Vital signs reviewed and stable  Post vital signs: Reviewed and stable  Last Vitals:  Vitals Value Taken Time  BP 103/65 06/04/21 0842  Temp    Pulse 81 06/04/21 0843  Resp 21 06/04/21 0843  SpO2 95 % 06/04/21 0843  Vitals shown include unvalidated device data.  Last Pain:  Vitals:   06/04/21 0841  TempSrc:   PainSc: 0-No pain         Complications: No notable events documented.

## 2021-06-04 NOTE — Anesthesia Preprocedure Evaluation (Addendum)
Anesthesia Evaluation  Patient identified by MRN, date of birth, ID band Patient awake    Reviewed: Allergy & Precautions, NPO status , Patient's Chart, lab work & pertinent test results  History of Anesthesia Complications Negative for: history of anesthetic complications  Airway Mallampati: I   Neck ROM: Full    Dental no notable dental hx.    Pulmonary neg pulmonary ROS,    Pulmonary exam normal breath sounds clear to auscultation       Cardiovascular Exercise Tolerance: Good Normal cardiovascular exam Rhythm:Regular Rate:Normal  ECG 05/27/21: normal   Neuro/Psych PSYCHIATRIC DISORDERS Anxiety Depression Daily marijuana use    GI/Hepatic GERD  ,  Endo/Other  negative endocrine ROS  Renal/GU negative Renal ROS     Musculoskeletal   Abdominal   Peds  Hematology negative hematology ROS (+)   Anesthesia Other Findings   Reproductive/Obstetrics                            Anesthesia Physical Anesthesia Plan  ASA: 2  Anesthesia Plan: General   Post-op Pain Management:    Induction: Intravenous  PONV Risk Score and Plan: 2 and Propofol infusion, TIVA and Treatment may vary due to age or medical condition  Airway Management Planned: Natural Airway  Additional Equipment:   Intra-op Plan:   Post-operative Plan:   Informed Consent: I have reviewed the patients History and Physical, chart, labs and discussed the procedure including the risks, benefits and alternatives for the proposed anesthesia with the patient or authorized representative who has indicated his/her understanding and acceptance.       Plan Discussed with: CRNA  Anesthesia Plan Comments: (LMA/GETA backup discussed.  Patient consented for risks of anesthesia including but not limited to:  - adverse reactions to medications - damage to eyes, teeth, lips or other oral mucosa - nerve damage due to positioning  -  sore throat or hoarseness - damage to heart, brain, nerves, lungs, other parts of body or loss of life  Informed patient about role of CRNA in peri- and intra-operative care.  Patient voiced understanding.)        Anesthesia Quick Evaluation

## 2021-06-04 NOTE — Anesthesia Postprocedure Evaluation (Signed)
Anesthesia Post Note  Patient: Jacob [name]  Procedure(s) Performed: COLONOSCOPY WITH PROPOFOL  Patient location during evaluation: PACU Anesthesia Type: General Level of consciousness: awake and alert, oriented and patient cooperative Pain management: pain level controlled Vital Signs Assessment: post-procedure vital signs reviewed and stable Respiratory status: spontaneous breathing, nonlabored ventilation and respiratory function stable Cardiovascular status: blood pressure returned to baseline and stable Postop Assessment: adequate PO intake Anesthetic complications: no   No notable events documented.   Last Vitals:  Vitals:   06/04/21 0901 06/04/21 0911  BP: 107/74 124/81  Pulse: 87 80  Resp: (!) 21 (!) 21  Temp:    SpO2: 93% 94%    Last Pain:  Vitals:   06/04/21 0911  TempSrc:   PainSc: 0-No pain                 Darrin Nipper

## 2021-06-05 ENCOUNTER — Encounter: Payer: Self-pay | Admitting: Gastroenterology

## 2021-06-26 ENCOUNTER — Ambulatory Visit (INDEPENDENT_AMBULATORY_CARE_PROVIDER_SITE_OTHER): Payer: BC Managed Care – PPO | Admitting: Family Medicine

## 2021-06-26 ENCOUNTER — Encounter: Payer: Self-pay | Admitting: Family Medicine

## 2021-06-26 ENCOUNTER — Other Ambulatory Visit: Payer: Self-pay

## 2021-06-26 VITALS — BP 143/82 | HR 84 | Temp 98.4°F | Ht 72.0 in | Wt 259.4 lb

## 2021-06-26 DIAGNOSIS — F331 Major depressive disorder, recurrent, moderate: Secondary | ICD-10-CM | POA: Diagnosis not present

## 2021-06-26 DIAGNOSIS — Z0000 Encounter for general adult medical examination without abnormal findings: Secondary | ICD-10-CM | POA: Diagnosis not present

## 2021-06-26 DIAGNOSIS — R002 Palpitations: Secondary | ICD-10-CM | POA: Diagnosis not present

## 2021-06-26 DIAGNOSIS — Z136 Encounter for screening for cardiovascular disorders: Secondary | ICD-10-CM | POA: Diagnosis not present

## 2021-06-26 DIAGNOSIS — F411 Generalized anxiety disorder: Secondary | ICD-10-CM

## 2021-06-26 MED ORDER — METOPROLOL SUCCINATE ER 25 MG PO TB24
12.5000 mg | ORAL_TABLET | Freq: Every day | ORAL | 1 refills | Status: DC
Start: 2021-06-26 — End: 2021-10-10

## 2021-06-26 NOTE — Assessment & Plan Note (Signed)
Stable. Would like to remain off meds until he has his neuropsych testing. Await results. Recheck 6-8 weeks.

## 2021-06-26 NOTE — Progress Notes (Signed)
BP (!) 143/82    Pulse 84    Temp 98.4 F (36.9 C)    Ht 6' (1.829 m)    Wt 259 lb 6.4 oz (117.7 kg)    SpO2 98%    BMI 35.18 kg/m    Subjective:    Patient ID: Jacob [name], male    DOB: 06-May-1991, 31 y.o.   MRN: [id number]  HPI: Ottis Vacha is a 31 y.o. male presenting on 06/26/2021 for comprehensive medical examination. Current medical complaints include:  Palpitations SIGNIFICANTLY better with the metoprolol.   ANXIETY- been a little weird coming off the cymbalta. He notes that he's been having some withdrawal symptoms, but they seem to be much better. He notes that he is starting to feel much more like himself.  Duration: chronic Status:stable Anxious mood: yes  Excessive worrying: yes Irritability:  yes   Sweating: no Nausea: no Palpitations:yes Hyperventilation: no Panic attacks: no Agoraphobia: no  Obscessions/compulsions: no Depressed mood: yes Depression screen [facility] 2/9 06/26/2021 05/27/2021 04/11/2021 03/24/2021 12/18/2020  Decreased Interest 1 1 1 3 1   Down, Depressed, Hopeless 1 2 1 2  0  PHQ - 2 Score 2 3 2 5 1   Altered sleeping 1 1 0 1 0  Tired, decreased energy 3 3 2 3 1   Change in appetite 1 3 1 1 1   Feeling bad or failure about yourself  0 0 0 1 0  Trouble concentrating 2 2 2 1 1   Moving slowly or fidgety/restless 3 1 1 1  0  Suicidal thoughts 0 0 1 0 0  PHQ-9 Score 12 13 9 13 4   Difficult doing work/chores - - Somewhat difficult Very difficult Somewhat difficult  Some recent data might be hidden   Anhedonia: no Weight changes: no Insomnia: no   Hypersomnia: no Fatigue/loss of energy: yes Feelings of worthlessness: yes Feelings of guilt: yes Impaired concentration/indecisiveness: no Suicidal ideations: no  Crying spells: no Recent Stressors/Life Changes: no   Relationship problems: no   Family stress: no     Financial stress: no    Job stress: no    Recent death/loss: no  Interim Problems from his last visit: no  Depression Screen done today and  results listed below:  Depression screen [facility] 2/9 06/26/2021 05/27/2021 04/11/2021 03/24/2021 12/18/2020  Decreased Interest 1 1 1 3 1   Down, Depressed, Hopeless 1 2 1 2  0  PHQ - 2 Score 2 3 2 5 1   Altered sleeping 1 1 0 1 0  Tired, decreased energy 3 3 2 3 1   Change in appetite 1 3 1 1 1   Feeling bad or failure about yourself  0 0 0 1 0  Trouble concentrating 2 2 2 1 1   Moving slowly or fidgety/restless 3 1 1 1  0  Suicidal thoughts 0 0 1 0 0  PHQ-9 Score 12 13 9 13 4   Difficult doing work/chores - - Somewhat difficult Very difficult Somewhat difficult  Some recent data might be hidden    Past Medical History:  Past Medical History:  Diagnosis Date   Anxiety    Depression     Surgical History:  Past Surgical History:  Procedure Laterality Date   COLONOSCOPY WITH PROPOFOL N/A 06/04/2021   Procedure: COLONOSCOPY WITH PROPOFOL;  Surgeon: Lin Landsman, MD;  Location: [facility] ENDOSCOPY;  Service: Gastroenterology;  Laterality: N/A;   ESOPHAGOGASTRODUODENOSCOPY (EGD) WITH PROPOFOL N/A 09/11/2020   Procedure: ESOPHAGOGASTRODUODENOSCOPY (EGD) WITH PROPOFOL;  Surgeon: Lin Landsman, MD;  Location: [place];  Service: Gastroenterology;  Laterality: N/A;   FLEXIBLE SIGMOIDOSCOPY N/A 09/11/2020   Procedure: FLEXIBLE SIGMOIDOSCOPY;  Surgeon: Lin Landsman, MD;  Location[facility] ENDOSCOPY;  Service: Gastroenterology;  Laterality: N/A;    Medications:  Current Outpatient Medications on File Prior to Visit  Medication Sig   LORazepam (ATIVAN) 0.5 MG tablet Take 1 tablet (0.5 mg total) by mouth 2 (two) times daily as needed for anxiety.   omeprazole (PRILOSEC) 20 MG capsule Take 1 capsule (20 mg total) by mouth daily.   No current facility-administered medications on file prior to visit.    Allergies:  Allergies  Allergen Reactions   Lexapro [Escitalopram]     Made patient dizzy   Wellbutrin [Bupropion] Other (See Comments)    Brain fog    Social History:  Social  History   Socioeconomic History   Marital status: Soil scientist    Spouse name: Not on file   Number of children: Not on file   Years of education: Not on file   Highest education level: Not on file  Occupational History   Not on file  Tobacco Use   Smoking status: Never   Smokeless tobacco: Never  Vaping Use   Vaping Use: Some days  Substance and Sexual Activity   Alcohol use: Yes    Comment: Socially   Drug use: Yes    Types: Marijuana    Comment: daily   Sexual activity: Yes  Other Topics Concern   Not on file  Social History Narrative   Not on file   Social Determinants of Health   Financial Resource Strain: Not on file  Food Insecurity: Not on file  Transportation Needs: Not on file  Physical Activity: Not on file  Stress: Not on file  Social Connections: Not on file  Intimate Partner Violence: Not on file   Social History   Tobacco Use  Smoking Status Never  Smokeless Tobacco Never   Social History   Substance and Sexual Activity  Alcohol Use Yes   Comment: Socially    Family History:  Family History  Problem Relation Age of Onset   ADD / ADHD Mother    Diabetes Mother    Early death Father    Autoimmune disease Father    Autoimmune disease Sister    Early death Brother    Autoimmune disease Brother    ADD / ADHD Maternal Grandmother     Past medical history, surgical history, medications, allergies, family history and social history reviewed with patient today and changes made to appropriate areas of the chart.   Review of Systems  Constitutional: Negative.   HENT: Negative.    Eyes: Negative.   Respiratory: Negative.    Cardiovascular:  Positive for palpitations. Negative for chest pain, orthopnea, claudication, leg swelling and PND.  Gastrointestinal:  Positive for diarrhea. Negative for abdominal pain, blood in stool, constipation, heartburn, melena, nausea and vomiting.  Genitourinary: Negative.   Musculoskeletal: Negative.    Skin: Negative.   Neurological: Negative.   Endo/Heme/Allergies: Negative.   Psychiatric/Behavioral:  Positive for depression. Negative for hallucinations, memory loss, substance abuse and suicidal ideas. The patient is nervous/anxious. The patient does not have insomnia.   All other ROS negative except what is listed above and in the HPI.      Objective:    BP (!) 143/82    Pulse 84    Temp 98.4 F (36.9 C)    Ht 6' (1.829 m)    Wt 259 lb 6.4 oz (  117.7 kg)    SpO2 98%    BMI 35.18 kg/m   Wt Readings from Last 3 Encounters:  06/26/21 259 lb 6.4 oz (117.7 kg)  06/04/21 260 lb (117.9 kg)  05/27/21 266 lb (120.7 kg)    Physical Exam  Results for orders placed or performed in visit on 05/02/21  CBC with Differential/Platelet  Result Value Ref Range   WBC 6.9 3.4 - 10.8 x10E3/uL   RBC 5.63 4.14 - 5.80 x10E6/uL   Hemoglobin 17.3 13.0 - 17.7 g/dL   Hematocrit 47.6 37.5 - 51.0 %   MCV 85 79 - 97 fL   MCH 30.7 26.6 - 33.0 pg   MCHC 36.3 (H) 31.5 - 35.7 g/dL   RDW 11.9 11.6 - 15.4 %   Platelets 228 150 - 450 x10E3/uL   Neutrophils 71 Not Estab. %   Lymphs 23 Not Estab. %   Monocytes 5 Not Estab. %   Eos 1 Not Estab. %   Basos 0 Not Estab. %   Neutrophils Absolute 4.9 1.4 - 7.0 x10E3/uL   Lymphocytes Absolute 1.5 0.7 - 3.1 x10E3/uL   Monocytes Absolute 0.4 0.1 - 0.9 x10E3/uL   EOS (ABSOLUTE) 0.1 0.0 - 0.4 x10E3/uL   Basophils Absolute 0.0 0.0 - 0.2 x10E3/uL   Immature Granulocytes 0 Not Estab. %   Immature Grans (Abs) 0.0 0.0 - 0.1 x10E3/uL  Comprehensive metabolic panel  Result Value Ref Range   Glucose 80 70 - 99 mg/dL   BUN 10 6 - 20 mg/dL   Creatinine, Ser 0.85 0.76 - 1.27 mg/dL   eGFR 120 >59 mL/min/1.73   BUN/Creatinine Ratio 12 9 - 20   Sodium 139 134 - 144 mmol/L   Potassium 4.1 3.5 - 5.2 mmol/L   Chloride 99 96 - 106 mmol/L   CO2 24 20 - 29 mmol/L   Calcium 10.2 8.7 - 10.2 mg/dL   Total Protein 6.8 6.0 - 8.5 g/dL   Albumin 4.8 4.1 - 5.2 g/dL   Globulin, Total  2.0 1.5 - 4.5 g/dL   Albumin/Globulin Ratio 2.4 (H) 1.2 - 2.2   Bilirubin Total 1.1 0.0 - 1.2 mg/dL   Alkaline Phosphatase 120 44 - 121 IU/L   AST 23 0 - 40 IU/L   ALT 43 0 - 44 IU/L  TSH  Result Value Ref Range   TSH 1.280 0.450 - 4.500 uIU/mL  VITAMIN D 25 Hydroxy (Vit-D Deficiency, Fractures)  Result Value Ref Range   Vit D, 25-Hydroxy 30.3 30.0 - 100.0 ng/mL      Assessment & Plan:   Problem List Items Addressed This Visit       Other   Depression    Stable. Would like to remain off meds until he has his neuropsych testing. Await results. Recheck 6-8 weeks.       GAD (generalized anxiety disorder)    Stable. Would like to remain off meds until he has his neuropsych testing. Await results. Recheck 6-8 weeks.       Palpitations    Doing much better on the metoprolol. Await holter with cardiology. Continue metoprolol. Call with any concerns.       Other Visit Diagnoses     Routine general medical examination at a health care facility    -  Primary   Vaccines up to date. Screening labs checked today. Continue diet and exercise. Call with any concerns.    Relevant Orders   Lipid Panel w/o Chol/HDL Ratio  LABORATORY TESTING:  Health maintenance labs ordered today as discussed above.   IMMUNIZATIONS:   - Tdap: Tetanus vaccination status reviewed: last tetanus booster within 10 years. - Influenza: Up to date - Pneumovax: Not applicable - Prevnar: Not applicable - COVID: Up to date  SCREENING: - Colonoscopy: Up to date  Discussed with patient purpose of the colonoscopy is to detect colon cancer at curable precancerous or early stages   PATIENT COUNSELING:    Sexuality: Discussed sexually transmitted diseases, partner selection, use of condoms, avoidance of unintended pregnancy  and contraceptive alternatives.   Advised to avoid cigarette smoking.  I discussed with the patient that most people either abstain from alcohol or drink within safe limits  (<=14/week and <=4 drinks/occasion for males, <=7/weeks and <= 3 drinks/occasion for females) and that the risk for alcohol disorders and other health effects rises proportionally with the number of drinks per week and how often a drinker exceeds daily limits.  Discussed cessation/primary prevention of drug use and availability of treatment for abuse.   Diet: Encouraged to adjust caloric intake to maintain  or achieve ideal body weight, to reduce intake of dietary saturated fat and total fat, to limit sodium intake by avoiding high sodium foods and not adding table salt, and to maintain adequate dietary potassium and calcium preferably from fresh fruits, vegetables, and low-fat dairy products.    stressed the importance of regular exercise  Injury prevention: Discussed safety belts, safety helmets, smoke detector, smoking near bedding or upholstery.   Dental health: Discussed importance of regular tooth brushing, flossing, and dental visits.   Follow up plan: NEXT PREVENTATIVE PHYSICAL DUE IN 1 YEAR. Return 6-8 weeks, virtual OK.

## 2021-06-26 NOTE — Assessment & Plan Note (Signed)
Doing much better on the metoprolol. Await holter with cardiology. Continue metoprolol. Call with any concerns.

## 2021-06-27 LAB — LIPID PANEL W/O CHOL/HDL RATIO
Cholesterol, Total: 184 mg/dL (ref 100–199)
HDL: 40 mg/dL (ref >39)
LDL Chol Calc (NIH): 115 mg/dL — ABNORMAL HIGH (ref 0–99)
Triglycerides: 165 mg/dL — ABNORMAL HIGH (ref 0–149)
VLDL Cholesterol Cal: 29 mg/dL (ref 5–40)

## 2021-07-18 ENCOUNTER — Ambulatory Visit: Payer: BC Managed Care – PPO | Admitting: Cardiology

## 2021-07-18 ENCOUNTER — Encounter: Payer: Self-pay | Admitting: Cardiology

## 2021-07-18 ENCOUNTER — Other Ambulatory Visit: Payer: Self-pay

## 2021-07-18 ENCOUNTER — Ambulatory Visit (INDEPENDENT_AMBULATORY_CARE_PROVIDER_SITE_OTHER): Payer: BC Managed Care – PPO

## 2021-07-18 VITALS — BP 142/92 | HR 104 | Ht 72.0 in | Wt 265.0 lb

## 2021-07-18 DIAGNOSIS — Z7289 Other problems related to lifestyle: Secondary | ICD-10-CM | POA: Diagnosis not present

## 2021-07-18 DIAGNOSIS — K2100 Gastro-esophageal reflux disease with esophagitis, without bleeding: Secondary | ICD-10-CM | POA: Diagnosis not present

## 2021-07-18 DIAGNOSIS — R009 Unspecified abnormalities of heart beat: Secondary | ICD-10-CM

## 2021-07-18 NOTE — Progress Notes (Signed)
Cardiology Office Note:    Date:  07/18/2021   ID:  Jacob [name], DOB May 05, 1991, MRN [id number]  PCP:  Valerie Roys, DO   [facility] Providers Cardiologist:  Kate Sable, MD     Referring MD: Valerie Roys, DO   Chief Complaint  Patient presents with   New Patient (Initial Visit)    Referred by PCP for palpitations. Meds reviewed verbally with patient    Jacob [name] is a 31 y.o. male who is being seen today for the evaluation of palpitations At the request of Valerie Roys, DO.   History of Present Illness:    Jacob [name] is a 31 y.o. male with a hx of anxiety, GERD, vaping who presents due to heartbeat abnormality.  Patient noticed prominent heartbeat starting 1 to 2 months ago.  He usually has feelings of his heartbeat pausing, followed by strong heartbeats.  Denies palpitations, dizziness.  Was started on Toprol XL 12.5 mg daily by PCP with resolution of symptoms.  Patient was advised to follow-up with cardiology to make sure there is no other arrhythmias underlying.  He denies chest pain, shortness of breath.  Currently uses e-cigarettes.  Past Medical History:  Diagnosis Date   Anxiety    Depression     Past Surgical History:  Procedure Laterality Date   COLONOSCOPY WITH PROPOFOL N/A 06/04/2021   Procedure: COLONOSCOPY WITH PROPOFOL;  Surgeon: Lin Landsman, MD;  Location: [facility] ENDOSCOPY;  Service: Gastroenterology;  Laterality: N/A;   ESOPHAGOGASTRODUODENOSCOPY (EGD) WITH PROPOFOL N/A 09/11/2020   Procedure: ESOPHAGOGASTRODUODENOSCOPY (EGD) WITH PROPOFOL;  Surgeon: Lin Landsman, MD;  Location: [facility] ENDOSCOPY;  Service: Gastroenterology;  Laterality: N/A;   FLEXIBLE SIGMOIDOSCOPY N/A 09/11/2020   Procedure: FLEXIBLE SIGMOIDOSCOPY;  Surgeon: Lin Landsman, MD;  Location: [facility] ENDOSCOPY;  Service: Gastroenterology;  Laterality: N/A;    Current Medications: Current Meds  Medication Sig   LORazepam (ATIVAN) 0.5 MG tablet Take 1 tablet (0.5  mg total) by mouth 2 (two) times daily as needed for anxiety.   metoprolol succinate (TOPROL-XL) 25 MG 24 hr tablet Take 0.5 tablets (12.5 mg total) by mouth daily.   omeprazole (PRILOSEC) 20 MG capsule Take 1 capsule (20 mg total) by mouth daily.     Allergies:   Lexapro [escitalopram] and Wellbutrin [bupropion]   Social History   Socioeconomic History   Marital status: Soil scientist    Spouse name: Not on file   Number of children: Not on file   Years of education: Not on file   Highest education level: Not on file  Occupational History   Not on file  Tobacco Use   Smoking status: Never   Smokeless tobacco: Never  Vaping Use   Vaping Use: Some days  Substance and Sexual Activity   Alcohol use: Yes    Comment: Socially   Drug use: Yes    Types: Marijuana    Comment: daily   Sexual activity: Yes  Other Topics Concern   Not on file  Social History Narrative   Not on file   Social Determinants of Health   Financial Resource Strain: Not on file  Food Insecurity: Not on file  Transportation Needs: Not on file  Physical Activity: Not on file  Stress: Not on file  Social Connections: Not on file     Family History: The patient's family history includes ADD / ADHD in his maternal grandmother and mother; Autoimmune disease in his brother, father, and sister; Diabetes in his mother; Early  death in his brother and father.  ROS:   Please see the history of present illness.     All other systems reviewed and are negative.  EKGs/Labs/Other Studies Reviewed:    The following studies were reviewed today:   EKG:  EKG is  ordered today.  The ekg ordered today demonstrates sinus tachycardia, heart rate 104  Recent Labs: 05/02/2021: ALT 43; BUN 10; Creatinine, Ser 0.85; Hemoglobin 17.3; Platelets 228; Potassium 4.1; Sodium 139; TSH 1.280  Recent Lipid Panel    Component Value Date/Time   CHOL 184 06/26/2021 1326   TRIG 165 (H) 06/26/2021 1326   HDL 40 06/26/2021 1326    LDLCALC 115 (H) 06/26/2021 1326     Risk Assessment/Calculations:          Physical Exam:    VS:  BP (!) 142/92 (BP Location: Left Arm, Patient Position: Sitting, Cuff Size: Normal)    Pulse (!) 104    Ht 6' (1.829 m)    Wt 265 lb (120.2 kg)    BMI 35.94 kg/m     Wt Readings from Last 3 Encounters:  07/18/21 265 lb (120.2 kg)  06/26/21 259 lb 6.4 oz (117.7 kg)  06/04/21 260 lb (117.9 kg)     GEN:  Well nourished, well developed in no acute distress HEENT: Normal NECK: No JVD; No carotid bruits LYMPHATICS: No lymphadenopathy CARDIAC: RRR, no murmurs, rubs, gallops RESPIRATORY:  Clear to auscultation without rales, wheezing or rhonchi  ABDOMEN: Soft, non-tender, non-distended MUSCULOSKELETAL:  No edema; No deformity  SKIN: Warm and dry NEUROLOGIC:  Alert and oriented x 3 PSYCHIATRIC:  Normal affect   ASSESSMENT:    1. Heart beat abnormality   2. Gastroesophageal reflux disease with esophagitis without hemorrhage   3. Engages in [place]:    In order of problems listed above:  Heart rate abnormality, prominent heartbeat.  Unsure if anxiety is playing a role versus skipped heartbeats/PACs, PVCs.  Symptoms improved with Toprol-XL.  Continue Toprol-XL.  Place cardiac monitor to evaluate any significant arrhythmias. History of GERD, controlled with Prilosec.  Continue Prilosec. Patient currently vapes.  Cessation strongly recommended.  Follow-up after cardiac monitor.       Medication Adjustments/Labs and Tests Ordered: Current medicines are reviewed at length with the patient today.  Concerns regarding medicines are outlined above.  Orders Placed This Encounter  Procedures   LONG TERM MONITOR (3-14 DAYS)   EKG 12-Lead   No orders of the defined types were placed in this encounter.   Patient Instructions  Medication Instructions:   Your physician recommends that you continue on your current medications as directed. Please refer to the Current  Medication list given to you today.   *If you need a refill on your cardiac medications before your next appointment, please call your pharmacy*   Lab Work: None ordered If you have labs (blood work) drawn today and your tests are completely normal, you will receive your results only by: [place] (if you have MyChart) OR A paper copy in the mail If you have any lab test that is abnormal or we need to change your treatment, we will call you to review the results.   Testing/Procedures:  Your physician has recommended that you wear a Zio XT monitor monitor for 2 weeks.  This will be mailed to your home in 4-5 business days.   This monitor is a medical device that records the hearts electrical activity. Doctors most often use these monitors  to diagnose arrhythmias. Arrhythmias are problems with the speed or rhythm of the heartbeat. The monitor is a small device applied to your chest. You can wear one while you do your normal daily activities. While wearing this monitor if you have any symptoms to push the button and record what you felt. Once you have worn this monitor for the period of time provider prescribed (Usually 14 days), you will return the monitor device in the postage paid box. Once it is returned they will download the data collected and provide [place] with a report which the provider will then review and we will call you with those results. Important tips:  Avoid showering during the first 24 hours of wearing the monitor. Avoid excessive sweating to help maximize wear time. Do not submerge the device, no hot tubs, and no swimming pools. Keep any lotions or oils away from the patch. After 24 hours you may shower with the patch on. Take brief showers with your back facing the shower head.  Do not remove patch once it has been placed because that will interrupt data and decrease adhesive wear time. Push the button when you have any symptoms and write down what you were  feeling. Once you have completed wearing your monitor, remove and place into box which has postage paid and place in your outgoing mailbox.  If for some reason you have misplaced your box then call our office and we can provide another box and/or mail it off for you.      Follow-Up: At [facility], you and your health needs are our priority.  As part of our continuing mission to provide you with exceptional heart care, we have created designated Provider Care Teams.  These Care Teams include your primary Cardiologist (physician) and Advanced Practice Providers (APPs -  Physician Assistants and Nurse Practitioners) who all work together to provide you with the care you need, when you need it.  We recommend signing up for the patient portal called "MyChart".  Sign up information is provided on this After Visit Summary.  MyChart is used to connect with patients for Virtual Visits (Telemedicine).  Patients are able to view lab/test results, encounter notes, upcoming appointments, etc.  Non-urgent messages can be sent to your provider as well.   To learn more about what you can do with MyChart, go to [email or web address].    Your next appointment:   6 week(s)  The format for your next appointment:   In Person  Provider:   You may see Kate Sable, MD or one of the following Advanced Practice Providers on your designated Care Team:   Murray Hodgkins, NP Christell Faith, PA-C Cadence Kathlen Mody, [place]    Other Instructions     Signed, Kate Sable, MD  07/18/2021 12:20 PM    [place]

## 2021-07-18 NOTE — Patient Instructions (Signed)
Medication Instructions:   Your physician recommends that you continue on your current medications as directed. Please refer to the Current Medication list given to you today.   *If you need a refill on your cardiac medications before your next appointment, please call your pharmacy*   Lab Work: None ordered If you have labs (blood work) drawn today and your tests are completely normal, you will receive your results only by: [place] (if you have MyChart) OR A paper copy in the mail If you have any lab test that is abnormal or we need to change your treatment, we will call you to review the results.   Testing/Procedures:  Your physician has recommended that you wear a Zio XT monitor monitor for 2 weeks.  This will be mailed to your home in 4-5 business days.   This monitor is a medical device that records the hearts electrical activity. Doctors most often use these monitors to diagnose arrhythmias. Arrhythmias are problems with the speed or rhythm of the heartbeat. The monitor is a small device applied to your chest. You can wear one while you do your normal daily activities. While wearing this monitor if you have any symptoms to push the button and record what you felt. Once you have worn this monitor for the period of time provider prescribed (Usually 14 days), you will return the monitor device in the postage paid box. Once it is returned they will download the data collected and provide [place] with a report which the provider will then review and we will call you with those results. Important tips:  Avoid showering during the first 24 hours of wearing the monitor. Avoid excessive sweating to help maximize wear time. Do not submerge the device, no hot tubs, and no swimming pools. Keep any lotions or oils away from the patch. After 24 hours you may shower with the patch on. Take brief showers with your back facing the shower head.  Do not remove patch once it has been placed because  that will interrupt data and decrease adhesive wear time. Push the button when you have any symptoms and write down what you were feeling. Once you have completed wearing your monitor, remove and place into box which has postage paid and place in your outgoing mailbox.  If for some reason you have misplaced your box then call our office and we can provide another box and/or mail it off for you.      Follow-Up: At [facility], you and your health needs are our priority.  As part of our continuing mission to provide you with exceptional heart care, we have created designated Provider Care Teams.  These Care Teams include your primary Cardiologist (physician) and Advanced Practice Providers (APPs -  Physician Assistants and Nurse Practitioners) who all work together to provide you with the care you need, when you need it.  We recommend signing up for the patient portal called "MyChart".  Sign up information is provided on this After Visit Summary.  MyChart is used to connect with patients for Virtual Visits (Telemedicine).  Patients are able to view lab/test results, encounter notes, upcoming appointments, etc.  Non-urgent messages can be sent to your provider as well.   To learn more about what you can do with MyChart, go to [email or web address].    Your next appointment:   6 week(s)  The format for your next appointment:   In Person  Provider:   You may see Kate Sable, MD or one of  the following Advanced Practice Providers on your designated Care Team:   Murray Hodgkins, NP Christell Faith, PA-C Cadence Kathlen Mody, [place]    Other Instructions

## 2021-07-22 DIAGNOSIS — R009 Unspecified abnormalities of heart beat: Secondary | ICD-10-CM | POA: Diagnosis not present

## 2021-07-23 ENCOUNTER — Other Ambulatory Visit: Payer: Self-pay

## 2021-07-23 ENCOUNTER — Encounter: Payer: Self-pay | Admitting: Psychology

## 2021-07-23 ENCOUNTER — Encounter: Payer: Self-pay | Admitting: Family Medicine

## 2021-07-23 ENCOUNTER — Encounter: Payer: BC Managed Care – PPO | Attending: Psychology | Admitting: Psychology

## 2021-07-23 DIAGNOSIS — F411 Generalized anxiety disorder: Secondary | ICD-10-CM | POA: Insufficient documentation

## 2021-07-23 DIAGNOSIS — R41840 Attention and concentration deficit: Secondary | ICD-10-CM | POA: Insufficient documentation

## 2021-07-23 DIAGNOSIS — F331 Major depressive disorder, recurrent, moderate: Secondary | ICD-10-CM | POA: Insufficient documentation

## 2021-07-23 NOTE — Progress Notes (Signed)
Neuropsychological Consultation   Patient:   Jacob [name]   DOB:   Jul 30, 1990  MR Number:  [id number]  Location:  [facility] [place], [place] [id number] [place] [address] Dept: [phone]           Date of Service:   07/23/2021  Start Time:   3 PM End Time:   5 PM  Today's visit was an in person visit that was conducted in my outpatient clinic office.  The patient myself were present for this visit.  1 hour and 15 minutes was spent in face-to-face clinical interview and the other 45 minutes was spent with record review and report writing as well as setting up testing protocols.  Provider/Observer:  Ilean Skill, Psy.D.       Clinical Neuropsychologist       Billing Code/Service: 96116/96121  Chief Complaint:    Jacob [name] is a 31 year old [name] referred for neuropsychological evaluation by his PCP Jacob Liter, DO.  The patient has a past medical history that includes a significant history of depression and anxiety with obsessive compulsive types of behaviors and history of palpitations as well as stable and actively treated depression.  The patient has requested an evaluation to help determine whether he may also have attention deficit disorder.  The patient reports that in the recent past his mother and grandmother were both diagnosed with ADD and he would like to rule out that as part of his symptomatology to aid in more effective treatment planning and efforts.  Patient's current medications include 0.5 mg of lorazepam 2 times per day as needed for anxiety.  Patient has been treated in the past for depression with Lexapro, which made him dizzy and Wellbutrin which caused cognitive difficulties and "brain fog."  Reason for Service:  Jacob [name] is a 31 year old [name] referred for neuropsychological evaluation by his PCP Jacob Liter, DO.  The patient has a past medical  history that includes a significant history of depression and anxiety with obsessive compulsive types of behaviors and history of palpitations as well as stable and actively treated depression.  The patient has requested an evaluation to help determine whether he may also have attention deficit disorder.  The patient reports that in the recent past his mother and grandmother were both diagnosed with ADD and he would like to rule out that as part of his symptomatology to aid in more effective treatment planning and efforts.  Patient's current medications include 0.5 mg of lorazepam 2 times per day as needed for anxiety.  Patient has been treated in the past for depression with Lexapro, which made him dizzy and Wellbutrin which caused cognitive difficulties and "brain fog."  Past medical history beyond anxiety and depression include obesity, chronic GERD, palpitations and history of colonic polyps.  The patient reports that his difficulties really started in his early 46s and he felt that they were just due to depression.  The patient reports that more recently his mother and grandmother were diagnosed with ADD and he wanted to rule out whether this was part of his difficulties.  The patient reports that he does not remember really having any attentional issues in elementary school but his family moved around a lot because of changing jobs with his father and he changed schools and there was never a consistent early school that he attended.  By middle school, the patient noticed having increasing issues with fatigue and  he discovered coffee as a self-medicating strategy.  Coffee then led to excessive use of energy drinks and reports that he became addicted to caffeine as well as likely other alkaloid type chemicals.  The patient reports that depression was not really an issue until his early 38s.  Between 48 and 51 years of age he lived with his grandmother and he helped out with chores around the house and this  kept him quite busy.  However by 20 he had moved out and depression began to be more of an issue and he reports anhedonia and lack of motivation to get things done.  Patient reports that eventually he just started to feel "numb."  He reports that he did not want to do anything and had no motivation.  Around 31 years of age she started abusing alcohol but engaged in the self-medication for less than a year.  He then went and sought help from his physician and they started looking at psychotropic medications.  Patient reports that while he does not remember the name of the medication he did take 1 first and it helped almost immediately and he became very motivated and active for about 2 weeks before it stopped working.  He has not had good responses to Lexapro or Wellbutrin previously.  The patient reports that he took sertraline for about 4 years in his mid 64s and he felt like it helped but may have started to wane over time and he simply stopped taking it.  Right now he is not taking any psychotropics besides lorazepam for his anxiety.  The patient reports that he has difficulty starting tasks and has difficulty completing them if he does start them.  He has no motivation for chores or taking care of himself.  He reports that he knows he can physically do them but he just cannot get it started and he constantly feels like it is "too much" for him to achieve.  The patient has very little interest and motivation and outside activities in general.  He reports that the most severe symptoms started in his early 42s and he started having trouble with mood and motivation.  He reports that he got really depressed.  He denies feeling like he was suicidal but he just does not remember these times very well.  He does not think anything specific or traumatic happened during this time.  The patient reports that he tends to stay up later that he knows that he should.  He reports that he does not have trouble sleeping.  The  patient reports that he usually is in bed by 12 PM and gets up around 7 AM or little later on the weekends.  He reports that his appetite is fine and that his memory is fine.  There has been some disconnect with his family during his adulthood.  The patient reports he does not feel like he speaks to his parents as much as he wishes but he and his family have started needing monthly for family nights which has helped overall.  There are some major stressors currently including change in his financial status and he reports that money is very tight for him right now.  His partner has not been able to work and is applying for disability so the patient is the only income for the household and reports that his work has become overwhelming at times and he is having trouble keeping up.  Behavioral Observation: Jacob [name]  presents as a 31 y.o.-year-old Right  handed Caucasian [name] who appeared his stated age. his dress was Appropriate and he was Well Groomed and his manners were Appropriate to the situation.  his participation was indicative of Appropriate and Redirectable behaviors.  There were not physical disabilities noted.  he displayed an appropriate level of cooperation and motivation.     Interactions:    Active Appropriate  Attention:   within normal limits and attention span and concentration were age appropriate  Memory:   within normal limits; recent and remote memory intact  Visuo-spatial:  not examined  Speech (Volume):  normal  Speech:   normal; normal  Thought Process:  Coherent and Relevant  Though Content:  WNL; not suicidal and not homicidal  Orientation:   person, place, time/date, and situation  Judgment:   Fair  Planning:   Fair  Affect:    Anxious  Mood:    Dysphoric  Insight:   Good  Intelligence:   normal  Marital Status/Living: The patient was born and raised in [place] state along with 6 siblings including 3 biological siblings, 2 stepbrothers and a half  brother.  The patient currently lives with his fiance and they have been together for the past for 5 years.  The patient is single but is engaged to be married.  He has no children.  Current Employment: The patient currently works in Engineer, technical sales as a Publishing copy.  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  However, anhedonia and fatigue keep him from doing any of these hobbies.  Substance Use:  The patient reports that he rarely has any alcohol and it is only socially or on special occasions.  He had a 1 year period around age 70 where he abused alcohol but stopped this in less than a year.  He does not use tobacco products.  The patient has used delta 8/THC to help with his anxiety at times.  Education:   HS Graduate  Medical History:   Past Medical History:  Diagnosis Date   Anxiety    Depression          Patient Active Problem List   Diagnosis Date Noted   History of colonic polyps    Palpitations 05/27/2021   Chronic GERD    Obesity (BMI 30-39.9) 01/26/2018   Depression    GAD (generalized anxiety disorder) 02/14/2015              Abuse/Trauma History: The patient did not report any type of history of abuse of her traumatic experiences.  Psychiatric History:  The patient does have a history of significant anxiety and depression and has had various psychotropic attempts to the years for his anxiety and depression.  He has had poor response to both Lexapro and Wellbutrin in the past.  Family Med/Psych History:  Family History  Problem Relation Age of Onset   ADD / ADHD Mother    Diabetes Mother    Early death Father    Autoimmune disease Father    Autoimmune disease Sister    Early death Brother    Autoimmune disease Brother    ADD / ADHD Maternal Grandmother     Impression/DX:  Baylee Dumlao is a 31 year old [name] referred for neuropsychological evaluation by his PCP Jacob Liter, DO.  The patient has a past  medical history that includes a significant history of depression and anxiety with obsessive compulsive types of behaviors and history of palpitations as well as stable and actively treated depression.  The patient has requested an evaluation to help determine whether he may also have attention deficit disorder.  The patient reports that in the recent past his mother and grandmother were both diagnosed with ADD and he would like to rule out that as part of his symptomatology to aid in more effective treatment planning and efforts.  Patient's current medications include 0.5 mg of lorazepam 2 times per day as needed for anxiety.  Patient has been treated in the past for depression with Lexapro, which made him dizzy and Wellbutrin which caused cognitive difficulties and "brain fog."  Disposition/Plan:  We have set the patient up for formal neuropsychological testing and he will complete the comprehensive attention battery and the CPT measure.  I will typically the patient's pattern of developing anxiety and depression with much of his attention and concentration developing clearly around that time would suggest that this is the primary etiological factor.  However, the patient did not have a consistent early school/academic experience as his family moved around a great deal and he is unsure how well his attention and concentration was.  The patient's mother and grandmother reported to have been diagnosed with ADD but I am unsure as to the extent of evaluation they had for that diagnosis.  Diagnosis:    Attention and concentration deficit  GAD (generalized anxiety disorder)  Major depressive disorder, recurrent episode, moderate (HCC)         Electronically Signed   _______________________ Ilean Skill, Psy.D. Clinical Neuropsychologist

## 2021-07-25 NOTE — Telephone Encounter (Signed)
appt

## 2021-07-28 NOTE — Telephone Encounter (Signed)
Pt scheduled 3/7

## 2021-07-29 ENCOUNTER — Ambulatory Visit: Payer: BC Managed Care – PPO | Admitting: Family Medicine

## 2021-07-29 ENCOUNTER — Encounter: Payer: Self-pay | Admitting: Family Medicine

## 2021-07-29 ENCOUNTER — Other Ambulatory Visit: Payer: Self-pay | Admitting: Family Medicine

## 2021-07-29 ENCOUNTER — Other Ambulatory Visit: Payer: Self-pay

## 2021-07-29 ENCOUNTER — Ambulatory Visit
Admission: RE | Admit: 2021-07-29 | Discharge: 2021-07-29 | Disposition: A | Discharge status: Home / Self Care | Payer: BC Managed Care – PPO | Source: Ambulatory Visit | Attending: Family Medicine | Admitting: Family Medicine

## 2021-07-29 VITALS — BP 181/79 | HR 118 | Temp 98.7°F | Wt 265.6 lb

## 2021-07-29 DIAGNOSIS — R1030 Lower abdominal pain, unspecified: Secondary | ICD-10-CM

## 2021-07-29 DIAGNOSIS — R1032 Left lower quadrant pain: Secondary | ICD-10-CM

## 2021-07-29 DIAGNOSIS — K429 Umbilical hernia without obstruction or gangrene: Secondary | ICD-10-CM | POA: Diagnosis not present

## 2021-07-29 DIAGNOSIS — N200 Calculus of kidney: Secondary | ICD-10-CM | POA: Diagnosis not present

## 2021-07-29 DIAGNOSIS — K4020 Bilateral inguinal hernia, without obstruction or gangrene, not specified as recurrent: Secondary | ICD-10-CM | POA: Diagnosis not present

## 2021-07-29 DIAGNOSIS — R319 Hematuria, unspecified: Secondary | ICD-10-CM

## 2021-07-29 LAB — CBC WITH DIFFERENTIAL/PLATELET
Hematocrit: 46.5 % (ref 37.5–51.0)
Hemoglobin: 17.1 g/dL (ref 13.0–17.7)
Lymphocytes Absolute: 1.5 10*3/uL (ref 0.7–3.1)
Lymphs: 26 %
MCH: 30.9 pg (ref 26.6–33.0)
MCHC: 36.8 g/dL — ABNORMAL HIGH (ref 31.5–35.7)
MCV: 84 fL (ref 79–97)
MID (Absolute): 0.3 10*3/uL (ref 0.1–1.6)
MID: 6 %
Neutrophils Absolute: 4 10*3/uL (ref 1.4–7.0)
Neutrophils: 69 %
Platelets: 201 10*3/uL (ref 150–450)
RBC: 5.53 x10E6/uL (ref 4.14–5.80)
RDW: 12.8 % (ref 11.6–15.4)
WBC: 5.8 10*3/uL (ref 3.4–10.8)

## 2021-07-29 LAB — MICROSCOPIC EXAMINATION

## 2021-07-29 MED ORDER — TRAMADOL HCL 50 MG PO TABS: 50.0000 mg | ORAL_TABLET | Freq: Three times a day (TID) | ORAL | 0 refills | Status: AC | PRN

## 2021-07-29 MED ORDER — TAMSULOSIN HCL 0.4 MG PO CAPS: 0.4000 mg | ORAL_CAPSULE | Freq: Every day | ORAL | 3 refills | Status: DC

## 2021-07-29 NOTE — Patient Instructions (Signed)
[facility]   ?[address] ?

## 2021-07-29 NOTE — Progress Notes (Signed)
? ?BP (!) 181/79   Pulse (!) 118   Temp 98.7 ?F (37.1 ?C)   Wt 265 lb 9.6 oz (120.5 kg)   SpO2 98%   BMI 36.02 kg/m?   ? ?Subjective:  ? ? Patient ID: Jacob [name], male    DOB: [birth year], 31 y.o.   MRN: [id number] ? ?HPI: ?Jacob [name] is a 31 y.o. male ? ?Chief Complaint  ?Patient presents with  ? Hematuria  ?  Patient has noticed when he had groin pain he had blood in his urine about 4 or 5 times, patient has noticed a foul smell in his urine as well.   ? Groin Pain  ?  Patient states for a few weeks off and on since February has been having groin pain   ? ?URINARY SYMPTOMS ?Duration: 4-5 weeks off and on ?Dysuria: yes ?Urinary frequency: yes ?Urgency: no ?Small volume voids: no ?Symptom severity: severe  ?Urinary incontinence: no ?Foul odor: yes ?Hematuria: yes ?Abdominal pain: no ?Back pain: no ?Suprapubic pain/pressure: no ?Flank pain: yes ?Fever:  no ?Vomiting: no ?Relief with cranberry juice: no ?Relief with pyridium: no ?Status: fluctuating ?Previous urinary tract infection: no ?Recurrent urinary tract infection: no ?History of sexually transmitted disease: no ?Penile discharge: no ?Treatments attempted: increasing fluids  ? ? ?Relevant past medical, surgical, family and social history reviewed and updated as indicated. Interim medical history since our last visit reviewed. ?Allergies and medications reviewed and updated. ? ?Review of Systems  ?Constitutional: Negative.   ?Respiratory: Negative.    ?Cardiovascular: Negative.   ?Gastrointestinal: Negative.   ?Genitourinary:  Positive for flank pain, frequency, hematuria and urgency. Negative for decreased urine volume, difficulty urinating, dysuria, enuresis, genital sores, penile discharge, penile pain, penile swelling, scrotal swelling and testicular pain.  ?Psychiatric/Behavioral: Negative.    ? ?Per HPI unless specifically indicated above ? ?   ?Objective:  ?  ?BP (!) 181/79   Pulse (!) 118   Temp 98.7 ?F (37.1 ?C)   Wt 265 lb 9.6 oz (120.5 kg)    SpO2 98%   BMI 36.02 kg/m?   ?Wt Readings from Last 3 Encounters:  ?07/29/21 265 lb 9.6 oz (120.5 kg)  ?07/18/21 265 lb (120.2 kg)  ?06/26/21 259 lb 6.4 oz (117.7 kg)  ?  ?Physical Exam ?Vitals and nursing note reviewed.  ?Constitutional:   ?   General: He is not in acute distress. ?   Appearance: Normal appearance. He is not ill-appearing, toxic-appearing or diaphoretic.  ?HENT:  ?   Head: Normocephalic and atraumatic.  ?   Right Ear: External ear normal.  ?   Left Ear: External ear normal.  ?   Nose: Nose normal.  ?   Mouth/Throat:  ?   Mouth: Mucous membranes are moist.  ?   Pharynx: Oropharynx is clear.  ?Eyes:  ?   General: No scleral icterus.    ?   Right eye: No discharge.     ?   Left eye: No discharge.  ?   Extraocular Movements: Extraocular movements intact.  ?   Conjunctiva/sclera: Conjunctivae normal.  ?   Pupils: Pupils are equal, round, and reactive to light.  ?Cardiovascular:  ?   Rate and Rhythm: Normal rate and regular rhythm.  ?   Pulses: Normal pulses.  ?   Heart sounds: Normal heart sounds. No murmur heard. ?  No friction rub. No gallop.  ?Pulmonary:  ?   Effort: Pulmonary effort is normal. No respiratory distress.  ?   Breath  sounds: Normal breath sounds. No stridor. No wheezing, rhonchi or rales.  ?Chest:  ?   Chest wall: No tenderness.  ?Abdominal:  ?   General: Abdomen is flat. Bowel sounds are normal. There is no distension.  ?   Palpations: Abdomen is soft. There is no mass.  ?   Tenderness: There is no abdominal tenderness. There is left CVA tenderness. There is no right CVA tenderness, guarding or rebound.  ?   Hernia: No hernia is present.  ?Musculoskeletal:     ?   General: Normal range of motion.  ?   Cervical back: Normal range of motion and neck supple.  ?Skin: ?   General: Skin is warm and dry.  ?   Capillary Refill: Capillary refill takes less than 2 seconds.  ?   Coloration: Skin is not jaundiced or pale.  ?   Findings: No bruising, erythema, lesion or rash.  ?Neurological:  ?    General: No focal deficit present.  ?   Mental Status: He is alert and oriented to person, place, and time. Mental status is at baseline.  ?Psychiatric:     ?   Mood and Affect: Mood normal.     ?   Behavior: Behavior normal.     ?   Thought Content: Thought content normal.     ?   Judgment: Judgment normal.  ? ? ?Results for orders placed or performed in visit on 06/26/21  ?Lipid Panel w/o Chol/HDL Ratio  ?Result Value Ref Range  ? Cholesterol, Total 184 100 - 199 mg/dL  ? Triglycerides 165 (H) 0 - 149 mg/dL  ? HDL 40 >39 mg/dL  ? VLDL Cholesterol Cal 29 5 - 40 mg/dL  ? LDL Chol Calc (NIH) 115 (H) 0 - 99 mg/dL  ? ?   ?Assessment & Plan:  ? ?Problem List Items Addressed This Visit   ?None ?Visit Diagnoses   ? ? Inguinal pain, unspecified laterality    -  Primary  ? Concern for kidney stone. 3+ hematuria. Will check CT. Await results.  ? Relevant Orders  ? CBC With Differential/Platelet  ? Urinalysis, Routine w reflex microscopic  ? CT RENAL STONE STUDY  ? Left lower quadrant abdominal pain      ? Concern for kidney stone. 3+ hematuria. Will check CT. Await results.  ? Relevant Orders  ? CT RENAL STONE STUDY  ? ?  ?  ? ?Follow up plan: ?Return if symptoms worsen or fail to improve. ? ? ? ? ? ?

## 2021-07-30 ENCOUNTER — Encounter: Payer: Self-pay | Admitting: Family Medicine

## 2021-07-30 LAB — MICROSCOPIC EXAMINATION
Epithelial Cells (non renal): NONE SEEN /hpf (ref 0–10)
RBC, Urine: >30 /hpf — ABNORMAL HIGH (ref 0–2)

## 2021-07-30 LAB — URINALYSIS, ROUTINE W REFLEX MICROSCOPIC
Bilirubin, UA: NEGATIVE
Glucose, UA: NEGATIVE
Ketones, UA: NEGATIVE
Nitrite, UA: NEGATIVE
Specific Gravity, UA: 1.02 (ref 1.005–1.030)
Urobilinogen, Ur: 0.2 mg/dL (ref 0.2–1.0)
pH, UA: 6.5 (ref 5.0–7.5)

## 2021-08-07 ENCOUNTER — Other Ambulatory Visit: Payer: Self-pay

## 2021-08-07 DIAGNOSIS — F331 Major depressive disorder, recurrent, moderate: Secondary | ICD-10-CM | POA: Diagnosis not present

## 2021-08-07 DIAGNOSIS — F411 Generalized anxiety disorder: Secondary | ICD-10-CM | POA: Diagnosis not present

## 2021-08-07 DIAGNOSIS — R41840 Attention and concentration deficit: Secondary | ICD-10-CM | POA: Diagnosis not present

## 2021-08-07 NOTE — Progress Notes (Signed)
? ?  Behavioral Observations ?The patient appeared well-groomed and appropriately dressed. His manners were polite and appropriate to the situation. He demonstrated a positive attitude toward testing and showed good effort. The patient did not have significant difficulty understanding testing instructions. On one occasion during the test, the patient reported that he "spaced out" and needed instructions repeated.  ? ?Neuropsychology Note ? ?Amandeep Stanly completed 60 minutes of neuropsychological testing with technician, Dina Rich, BA, under the supervision of Ilean Skill, PsyD., Clinical Neuropsychologist. The patient did not appear overtly distressed by the testing session, per behavioral observation or via self-report to the technician. Rest breaks were offered.  ? ?Clinical Decision Making: In considering the patient's current level of functioning, level of presumed impairment, nature of symptoms, emotional and behavioral responses during clinical interview, level of literacy, and observed level of motivation/effort, a battery of tests was selected by Dr. Sima Matas during initial consultation on 07/23/2021. This was communicated to the technician. Communication between the neuropsychologist and technician was ongoing throughout the testing session and changes were made as deemed necessary based on patient performance on testing, technician observations and additional pertinent factors such as those listed above. ? ?Tests Administered: ?Comprehensive Attention Battery (CAB) ?Continuous Performance Test (CPT) ? ?Results: ?Will be included in final report  ? ?Feedback to Patient: ?Lennex Pietila will return on 02/03/2022 for an interactive feedback session with Dr. Sima Matas at which time his test performances, clinical impressions and treatment recommendations will be reviewed in detail. The patient understands he can contact our office should he require our assistance before this time. ? ?60 minutes spent  face-to-face with patient administering standardized tests, 30 minutes spent scoring (technician). [CPT [id number], 64403] ? ?Full report to follow.  ?

## 2021-08-11 ENCOUNTER — Encounter: Payer: Self-pay | Admitting: Family Medicine

## 2021-08-11 ENCOUNTER — Ambulatory Visit: Payer: BC Managed Care – PPO | Admitting: Family Medicine

## 2021-08-11 ENCOUNTER — Other Ambulatory Visit: Payer: Self-pay

## 2021-08-11 VITALS — BP 150/84 | HR 88 | Temp 98.7°F | Ht 72.0 in | Wt 266.4 lb

## 2021-08-11 DIAGNOSIS — F331 Major depressive disorder, recurrent, moderate: Secondary | ICD-10-CM

## 2021-08-11 MED ORDER — FLUOXETINE HCL 10 MG PO TABS: 10.0000 mg | ORAL_TABLET | Freq: Every day | ORAL | 3 refills | Status: DC

## 2021-08-11 NOTE — Progress Notes (Signed)
? ?BP (!) 150/84 (BP Location: Left Arm, Cuff Size: Normal)   Pulse 88   Temp 98.7 ?F (37.1 ?C) (Oral)   Ht 6' (1.829 m)   Wt 266 lb 6.4 oz (120.8 kg)   SpO2 98%   BMI 36.13 kg/m?   ? ?Subjective:  ? ? Patient ID: Jacob [name], male    DOB: [birth year], 31 y.o.   MRN: [id number] ? ?HPI: ?Ryelan Kazee is a 31 y.o. male ? ?Chief Complaint  ?Patient presents with  ? Depression  ?  Patient states he would like to discuss with provider about starting medication as he has noticed his depression is getting worse.   ? Anxiety  ? Palpitations  ? Nephrolithiasis  ? ?ANXIETY/DEPRESSION- has tried cymbalta, zoloft, lexapro, welbutrin with side effects ?Duration: chronic ?Status:uncontrolled ?Anxious mood: yes  ?Excessive worrying: yes ?Irritability: no  ?Sweating: no ?Nausea: no ?Palpitations:no ?Hyperventilation: no ?Panic attacks: yes ?Agoraphobia: no  ?Obscessions/compulsions: no ?Depressed mood: yes ? ?  08/11/2021  ?  4:25 PM 06/26/2021  ?  1:11 PM 05/27/2021  ?  8:36 AM 04/11/2021  ?  4:06 PM 03/24/2021  ?  9:25 AM  ?Depression screen PHQ 2/9  ?Decreased Interest '3 1 1 1 3  '$ ?Down, Depressed, Hopeless '3 1 2 1 2  '$ ?PHQ - 2 Score '6 2 3 2 5  '$ ?Altered sleeping 0 1 1 0 1  ?Tired, decreased energy '3 3 3 2 3  '$ ?Change in appetite '2 1 3 1 1  '$ ?Feeling bad or failure about yourself  1 0 0 0 1  ?Trouble concentrating 0 '2 2 2 1  '$ ?Moving slowly or fidgety/restless '3 3 1 1 1  '$ ?Suicidal thoughts 1 0 0 1 0  ?PHQ-9 Score '16 12 13 9 13  '$ ?Difficult doing work/chores    Somewhat difficult Very difficult  ? ? ?  08/11/2021  ?  4:25 PM 05/27/2021  ?  8:37 AM 04/11/2021  ?  4:08 PM 03/24/2021  ?  9:27 AM  ?GAD 7 : Generalized Anxiety Score  ?Nervous, Anxious, on Edge 0 '3 1 1  '$ ?Control/stop worrying 0 '2 1 1  '$ ?Worry too much - different things 0 '1 1 2  '$ ?Trouble relaxing 0 '3 1 1  '$ ?Restless '2 3 2 2  '$ ?Easily annoyed or irritable '1 2 1 2  '$ ?Afraid - awful might happen 0 '3 1 2  '$ ?Total GAD 7 Score '3 17 8 11  '$ ?Anxiety Difficulty Not difficult at all Very difficult  Very difficult Not difficult at all  ? ?Anhedonia: no ?Weight changes: no ?Insomnia: no   ?Hypersomnia: yes ?Fatigue/loss of energy: yes ?Feelings of worthlessness: yes ?Feelings of guilt: yes ?Impaired concentration/indecisiveness: no ?Suicidal ideations: yes  ?Crying spells: yes ?Recent Stressors/Life Changes: no ?  Relationship problems: no ?  Family stress: no   ?  Financial stress: no  ?  Job stress: no  ?  Recent death/loss: no ? ? ?Relevant past medical, surgical, family and social history reviewed and updated as indicated. Interim medical history since our last visit reviewed. ?Allergies and medications reviewed and updated. ? ?Review of Systems  ?Constitutional: Negative.   ?Respiratory: Negative.    ?Cardiovascular: Negative.   ?Gastrointestinal: Negative.   ?Musculoskeletal: Negative.   ?Neurological: Negative.   ?Psychiatric/Behavioral:  Positive for dysphoric mood and suicidal ideas. Negative for agitation, behavioral problems, confusion, decreased concentration, hallucinations, self-injury and sleep disturbance. The patient is nervous/anxious. The patient is not hyperactive.   ? ?Per HPI unless specifically  indicated above ? ?   ?Objective:  ?  ?BP (!) 150/84 (BP Location: Left Arm, Cuff Size: Normal)   Pulse 88   Temp 98.7 ?F (37.1 ?C) (Oral)   Ht 6' (1.829 m)   Wt 266 lb 6.4 oz (120.8 kg)   SpO2 98%   BMI 36.13 kg/m?   ?Wt Readings from Last 3 Encounters:  ?08/11/21 266 lb 6.4 oz (120.8 kg)  ?07/29/21 265 lb 9.6 oz (120.5 kg)  ?07/18/21 265 lb (120.2 kg)  ?  ?Physical Exam ?Vitals and nursing note reviewed.  ?Constitutional:   ?   General: He is not in acute distress. ?   Appearance: Normal appearance. He is not ill-appearing, toxic-appearing or diaphoretic.  ?HENT:  ?   Head: Normocephalic and atraumatic.  ?   Right Ear: External ear normal.  ?   Left Ear: External ear normal.  ?   Nose: Nose normal.  ?   Mouth/Throat:  ?   Mouth: Mucous membranes are moist.  ?   Pharynx: Oropharynx is clear.   ?Eyes:  ?   General: No scleral icterus.    ?   Right eye: No discharge.     ?   Left eye: No discharge.  ?   Extraocular Movements: Extraocular movements intact.  ?   Conjunctiva/sclera: Conjunctivae normal.  ?   Pupils: Pupils are equal, round, and reactive to light.  ?Cardiovascular:  ?   Rate and Rhythm: Normal rate and regular rhythm.  ?   Pulses: Normal pulses.  ?   Heart sounds: Normal heart sounds. No murmur heard. ?  No friction rub. No gallop.  ?Pulmonary:  ?   Effort: Pulmonary effort is normal. No respiratory distress.  ?   Breath sounds: Normal breath sounds. No stridor. No wheezing, rhonchi or rales.  ?Chest:  ?   Chest wall: No tenderness.  ?Musculoskeletal:     ?   General: Normal range of motion.  ?   Cervical back: Normal range of motion and neck supple.  ?Skin: ?   General: Skin is warm and dry.  ?   Capillary Refill: Capillary refill takes less than 2 seconds.  ?   Coloration: Skin is not jaundiced or pale.  ?   Findings: No bruising, erythema, lesion or rash.  ?Neurological:  ?   General: No focal deficit present.  ?   Mental Status: He is alert and oriented to person, place, and time. Mental status is at baseline.  ?Psychiatric:     ?   Mood and Affect: Mood normal.     ?   Behavior: Behavior normal.     ?   Thought Content: Thought content normal.     ?   Judgment: Judgment normal.  ? ? ?Results for orders placed or performed in visit on 07/29/21  ?Microscopic Examination  ? Urine  ?Result Value Ref Range  ? WBC, UA 0-5 0 - 5 /hpf  ? RBC >30 (H) 0 - 2 /hpf  ? Epithelial Cells (non renal) None seen 0 - 10 /hpf  ? Bacteria, UA Few (A) None seen/Few  ?CBC With Differential/Platelet  ?Result Value Ref Range  ? WBC 5.8 3.4 - 10.8 x10E3/uL  ? RBC 5.53 4.14 - 5.80 x10E6/uL  ? Hemoglobin 17.1 13.0 - 17.7 g/dL  ? Hematocrit 46.5 37.5 - 51.0 %  ? MCV 84 79 - 97 fL  ? MCH 30.9 26.6 - 33.0 pg  ? MCHC 36.8 (H) 31.5 - 35.7 g/dL  ?  RDW 12.8 11.6 - 15.4 %  ? Platelets 201 150 - 450 x10E3/uL  ? Neutrophils 69  Not Estab. %  ? Lymphs 26 Not Estab. %  ? MID 6 Not Estab. %  ? Neutrophils Absolute 4.0 1.4 - 7.0 x10E3/uL  ? Lymphocytes Absolute 1.5 0.7 - 3.1 x10E3/uL  ? MID (Absolute) 0.3 0.1 - 1.6 X10E3/uL  ?Urinalysis, Routine w reflex microscopic  ?Result Value Ref Range  ? Specific Gravity, UA 1.020 1.005 - 1.030  ? pH, UA 6.5 5.0 - 7.5  ? Color, UA Yellow Yellow  ? Appearance Ur Cloudy (A) Clear  ? Leukocytes,UA 1+ (A) Negative  ? Protein,UA 1+ (A) Negative/Trace  ? Glucose, UA Negative Negative  ? Ketones, UA Negative Negative  ? RBC, UA 3+ (A) Negative  ? Bilirubin, UA Negative Negative  ? Urobilinogen, Ur 0.2 0.2 - 1.0 mg/dL  ? Nitrite, UA Negative Negative  ? Microscopic Examination See below:   ? ?   ?Assessment & Plan:  ? ?Problem List Items Addressed This Visit   ? ?  ? Other  ? Depression - Primary  ?  Will start prozac. Recheck 1 month. Call with any concerns. Continue to monitor.  ?  ?  ? Relevant Medications  ? FLUoxetine (PROZAC) 10 MG tablet  ?  ? ?Follow up plan: ?Return in about 4 weeks (around 09/08/2021). ? ? ? ? ? ?

## 2021-08-12 ENCOUNTER — Encounter: Payer: Self-pay | Admitting: Family Medicine

## 2021-08-13 DIAGNOSIS — R009 Unspecified abnormalities of heart beat: Secondary | ICD-10-CM | POA: Diagnosis not present

## 2021-08-14 ENCOUNTER — Encounter: Payer: Self-pay | Admitting: Family Medicine

## 2021-08-14 NOTE — Assessment & Plan Note (Signed)
Will start prozac. Recheck 1 month. Call with any concerns. Continue to monitor.  ?

## 2021-08-29 ENCOUNTER — Ambulatory Visit: Payer: BC Managed Care – PPO | Admitting: Cardiology

## 2021-09-03 ENCOUNTER — Other Ambulatory Visit: Payer: Self-pay | Admitting: Family Medicine

## 2021-09-04 NOTE — Telephone Encounter (Signed)
Requested medication (s) are due for refill today: no ? ?Requested medication (s) are on the active medication list: yes ? ?Last refill:  08/11/21 ? ?Future visit scheduled: yes ? ?Notes to clinic:  Unable to refill per protocol, pharmacy requesting 90 day supply. ? ? ?  ?Requested Prescriptions  ?Pending Prescriptions Disp Refills  ? FLUoxetine (PROZAC) 10 MG tablet [Pharmacy Med Name: FLUOXETINE HCL 10 MG TABLET] 90 tablet 2  ?  Sig: TAKE 1 TABLET BY MOUTH EVERY DAY  ?  ? Psychiatry:  Antidepressants - SSRI Passed - 09/03/2021  1:34 PM  ?  ?  Passed - Completed PHQ-2 or PHQ-9 in the last 360 days  ?  ?  Passed - Valid encounter within last 6 months  ?  Recent Outpatient Visits   ? ?      ? 3 weeks ago Moderate episode of recurrent major depressive disorder ([place])  ? [place] P, DO  ? 1 month ago Inguinal pain, unspecified laterality  ? [place], DO  ? 2 months ago Routine general medical examination at a health care facility  ? [place], Megan P, DO  ? 3 months ago Palpitations  ? [place] P, DO  ? 4 months ago Panic attacks  ? [place], [place] P, DO  ? ?  ?  ?Future Appointments   ? ?        ? In 1 week Wynetta Emery, Barb Merino, DO [place]  ? ?  ? ?  ?  ?  ? ? ?

## 2021-09-10 ENCOUNTER — Encounter: Payer: Self-pay | Admitting: Family Medicine

## 2021-09-11 ENCOUNTER — Telehealth: Payer: BC Managed Care – PPO | Admitting: Family Medicine

## 2021-09-11 ENCOUNTER — Encounter: Payer: Self-pay | Admitting: Family Medicine

## 2021-09-11 DIAGNOSIS — F411 Generalized anxiety disorder: Secondary | ICD-10-CM | POA: Diagnosis not present

## 2021-09-11 DIAGNOSIS — F331 Major depressive disorder, recurrent, moderate: Secondary | ICD-10-CM

## 2021-09-11 MED ORDER — TAMSULOSIN HCL 0.4 MG PO CAPS: 0.4000 mg | ORAL_CAPSULE | Freq: Every day | ORAL | 1 refills | Status: DC

## 2021-09-11 MED ORDER — FLUOXETINE HCL 20 MG PO CAPS: 20.0000 mg | ORAL_CAPSULE | Freq: Every day | ORAL | 1 refills | Status: DC

## 2021-09-11 NOTE — Assessment & Plan Note (Signed)
Doing a bit better. Tolerating the prozac pretty well. Will increase him to '20mg'$  and recheck 1 month. Call with any concerns.  ?

## 2021-09-11 NOTE — Progress Notes (Signed)
? ?There were no vitals taken for this visit.  ? ?Subjective:  ? ? Patient ID: Jacob [name], male    DOB: [birth year], 31 y.o.   MRN: [id number] ? ?HPI: ?Jacob [name] is a 31 y.o. male ? ?Chief Complaint  ?Patient presents with  ? Depression  ? Anxiety  ? ?DEPRESSION ?Mood status: better ?Satisfied with current treatment?: yes ?Symptom severity: moderate  ?Duration of current treatment :  4 weeks ?Side effects: a little difficulty with concentration and decreased appetite ?Medication compliance: good compliance ?Psychotherapy/counseling: no  ?Depressed mood: yes ?Anxious mood: yes ?Anhedonia: no ?Significant weight loss or gain: no ?Insomnia: no  ?Fatigue: yes ?Feelings of worthlessness or guilt: yes ?Impaired concentration/indecisiveness: yes ?Suicidal ideations: no ?Hopelessness: no ?Crying spells: no ? ?  09/11/2021  ?  2:14 PM 08/11/2021  ?  4:25 PM 06/26/2021  ?  1:11 PM 05/27/2021  ?  8:36 AM 04/11/2021  ?  4:06 PM  ?Depression screen PHQ 2/9  ?Decreased Interest '1 3 1 1 1  '$ ?Down, Depressed, Hopeless 0 '3 1 2 1  '$ ?PHQ - 2 Score '1 6 2 3 2  '$ ?Altered sleeping 1 0 1 1 0  ?Tired, decreased energy '2 3 3 3 2  '$ ?Change in appetite '3 2 1 3 1  '$ ?Feeling bad or failure about yourself  0 1 0 0 0  ?Trouble concentrating 2 0 '2 2 2  '$ ?Moving slowly or fidgety/restless 0 '3 3 1 1  '$ ?Suicidal thoughts 0 1 0 0 1  ?PHQ-9 Score '9 16 12 13 9  '$ ?Difficult doing work/chores Somewhat difficult    Somewhat difficult  ? ? ?  09/11/2021  ?  2:16 PM 08/11/2021  ?  4:25 PM 05/27/2021  ?  8:37 AM 04/11/2021  ?  4:08 PM  ?GAD 7 : Generalized Anxiety Score  ?Nervous, Anxious, on Edge 0 0 3 1  ?Control/stop worrying 0 0 2 1  ?Worry too much - different things 0 0 1 1  ?Trouble relaxing 0 0 3 1  ?Restless '3 2 3 2  '$ ?Easily annoyed or irritable '1 1 2 1  '$ ?Afraid - awful might happen 0 0 3 1  ?Total GAD 7 Score '4 3 17 8  '$ ?Anxiety Difficulty Somewhat difficult Not difficult at all Very difficult Very difficult  ? ? ?Relevant past medical, surgical, family and social  history reviewed and updated as indicated. Interim medical history since our last visit reviewed. ?Allergies and medications reviewed and updated. ? ?Review of Systems  ?Constitutional: Negative.   ?Respiratory: Negative.    ?Cardiovascular: Negative.   ?Gastrointestinal: Negative.   ?Musculoskeletal: Negative.   ?Psychiatric/Behavioral: Negative.    ? ?Per HPI unless specifically indicated above ? ?   ?Objective:  ?  ?There were no vitals taken for this visit.  ?Wt Readings from Last 3 Encounters:  ?08/11/21 266 lb 6.4 oz (120.8 kg)  ?07/29/21 265 lb 9.6 oz (120.5 kg)  ?07/18/21 265 lb (120.2 kg)  ?  ?Physical Exam ?Vitals and nursing note reviewed.  ?Constitutional:   ?   General: He is not in acute distress. ?   Appearance: Normal appearance. He is not ill-appearing, toxic-appearing or diaphoretic.  ?HENT:  ?   Head: Normocephalic and atraumatic.  ?   Right Ear: External ear normal.  ?   Left Ear: External ear normal.  ?   Nose: Nose normal.  ?   Mouth/Throat:  ?   Mouth: Mucous membranes are moist.  ?   Pharynx:  Oropharynx is clear.  ?Eyes:  ?   General: No scleral icterus.    ?   Right eye: No discharge.     ?   Left eye: No discharge.  ?   Conjunctiva/sclera: Conjunctivae normal.  ?   Pupils: Pupils are equal, round, and reactive to light.  ?Pulmonary:  ?   Effort: Pulmonary effort is normal. No respiratory distress.  ?   Comments: Speaking in full sentences ?Musculoskeletal:     ?   General: Normal range of motion.  ?   Cervical back: Normal range of motion.  ?Skin: ?   Coloration: Skin is not jaundiced or pale.  ?   Findings: No bruising, erythema, lesion or rash.  ?Neurological:  ?   Mental Status: He is alert and oriented to person, place, and time. Mental status is at baseline.  ?Psychiatric:     ?   Mood and Affect: Mood normal.     ?   Behavior: Behavior normal.     ?   Thought Content: Thought content normal.     ?   Judgment: Judgment normal.  ? ? ?Results for orders placed or performed in visit on  07/29/21  ?Microscopic Examination  ? Urine  ?Result Value Ref Range  ? WBC, UA 0-5 0 - 5 /hpf  ? RBC >30 (H) 0 - 2 /hpf  ? Epithelial Cells (non renal) None seen 0 - 10 /hpf  ? Bacteria, UA Few (A) None seen/Few  ?CBC With Differential/Platelet  ?Result Value Ref Range  ? WBC 5.8 3.4 - 10.8 x10E3/uL  ? RBC 5.53 4.14 - 5.80 x10E6/uL  ? Hemoglobin 17.1 13.0 - 17.7 g/dL  ? Hematocrit 46.5 37.5 - 51.0 %  ? MCV 84 79 - 97 fL  ? MCH 30.9 26.6 - 33.0 pg  ? MCHC 36.8 (H) 31.5 - 35.7 g/dL  ? RDW 12.8 11.6 - 15.4 %  ? Platelets 201 150 - 450 x10E3/uL  ? Neutrophils 69 Not Estab. %  ? Lymphs 26 Not Estab. %  ? MID 6 Not Estab. %  ? Neutrophils Absolute 4.0 1.4 - 7.0 x10E3/uL  ? Lymphocytes Absolute 1.5 0.7 - 3.1 x10E3/uL  ? MID (Absolute) 0.3 0.1 - 1.6 X10E3/uL  ?Urinalysis, Routine w reflex microscopic  ?Result Value Ref Range  ? Specific Gravity, UA 1.020 1.005 - 1.030  ? pH, UA 6.5 5.0 - 7.5  ? Color, UA Yellow Yellow  ? Appearance Ur Cloudy (A) Clear  ? Leukocytes,UA 1+ (A) Negative  ? Protein,UA 1+ (A) Negative/Trace  ? Glucose, UA Negative Negative  ? Ketones, UA Negative Negative  ? RBC, UA 3+ (A) Negative  ? Bilirubin, UA Negative Negative  ? Urobilinogen, Ur 0.2 0.2 - 1.0 mg/dL  ? Nitrite, UA Negative Negative  ? Microscopic Examination See below:   ? ?   ?Assessment & Plan:  ? ?Problem List Items Addressed This Visit   ? ?  ? Other  ? Depression  ?  Doing a bit better. Tolerating the prozac pretty well. Will increase him to '20mg'$  and recheck 1 month. Call with any concerns.  ? ?  ?  ? Relevant Medications  ? FLUoxetine (PROZAC) 20 MG capsule  ? GAD (generalized anxiety disorder)  ?  Doing a bit better. Tolerating the prozac pretty well. Will increase him to '20mg'$  and recheck 1 month. Call with any concerns.  ? ?  ?  ? Relevant Medications  ? FLUoxetine (PROZAC) 20 MG capsule  ?  ? ?  Follow up plan: ?Return in about 4 weeks (around 10/09/2021) for virtual OK. ? ? ? ?This visit was completed via video visit through  MyChart due to the restrictions of the COVID-19 pandemic. All issues as above were discussed and addressed. Physical exam was done as above through visual confirmation on video through MyChart. If it was felt that the patient should be evaluated in the office, they were directed there. The patient verbally consented to this visit. ?Location of the patient: home ?Location of the provider: work ?Those involved with this call:  ?Provider: Park Liter, DO ?CMA: Yvonna Alanis, [place] ?Front Desk/Registration: [employer]  ?Time spent on call:  15 minutes with patient face to face via video conference. More than 50% of this time was spent in counseling and coordination of care. 23 minutes total spent in review of patient's record and preparation of their chart. ? ? ? ?

## 2021-09-12 NOTE — Telephone Encounter (Signed)
Pt seen 4/20

## 2021-10-04 ENCOUNTER — Other Ambulatory Visit: Payer: Self-pay | Admitting: Family Medicine

## 2021-10-07 NOTE — Telephone Encounter (Signed)
Requested medication (s) are due for refill today: No ? ?Requested medication (s) are on the active medication list: Yes ? ?Last refill:  09/11/21 ? ?Future visit scheduled: No ? ?Notes to clinic:  Pharmacy requests 90 day supply. ? ? ? ?Requested Prescriptions  ?Pending Prescriptions Disp Refills  ? FLUoxetine (PROZAC) 20 MG capsule [Pharmacy Med Name: FLUOXETINE HCL 20 MG CAPSULE] 90 capsule 1  ?  Sig: TAKE 1 CAPSULE BY MOUTH EVERY DAY  ?  ? Psychiatry:  Antidepressants - SSRI Passed - 10/04/2021 12:31 PM  ?  ?  Passed - Completed PHQ-2 or PHQ-9 in the last 360 days  ?  ?  Passed - Valid encounter within last 6 months  ?  Recent Outpatient Visits   ? ?      ? 3 weeks ago Moderate episode of recurrent major depressive disorder ([place])  ? [place], [place] P, DO  ? 1 month ago Moderate episode of recurrent major depressive disorder ([place])  ? [place] P, DO  ? 2 months ago Inguinal pain, unspecified laterality  ? [place], DO  ? 3 months ago Routine general medical examination at a health care facility  ? [place], Megan P, DO  ? 4 months ago Palpitations  ? [place], [place] P, DO  ? ?  ?  ? ? ?  ?  ?  ? ?

## 2021-10-08 ENCOUNTER — Encounter: Payer: BC Managed Care – PPO | Attending: Psychology | Admitting: Psychology

## 2021-10-08 ENCOUNTER — Encounter: Payer: Self-pay | Admitting: Psychology

## 2021-10-08 DIAGNOSIS — F411 Generalized anxiety disorder: Secondary | ICD-10-CM | POA: Diagnosis not present

## 2021-10-08 DIAGNOSIS — F331 Major depressive disorder, recurrent, moderate: Secondary | ICD-10-CM | POA: Diagnosis not present

## 2021-10-08 NOTE — Telephone Encounter (Signed)
Called patient and LVM to schedule a follow up. Patient due for follow up 10/10/18. OK for virtual appt.  ?

## 2021-10-08 NOTE — Progress Notes (Signed)
Neuropsychological Evaluation ? ? ?Patient:  Jacob [name]  ? ?DOB: [birth year] ? ?MR Number: [id number] ? ?Location: [facility] ?[address] [place] ?[id number] ?[place] [address] ?Dept: [phone] ? ?Start: 1 PM ?End: 2 PM ? ?Provider/Observer:     Edgardo Roys PsyD ? ?Chief Complaint:      ?Chief Complaint  ?Patient presents with  ? Anxiety  ? Depression  ? Other  ?  Attentional difficulties  ? ? ?Reason For Service:     [name] is a 31 year old male referred for neuropsychological evaluation by his PCP Park Liter, DO.  The patient has a past medical history that includes a significant history of depression and anxiety with obsessive compulsive types of behaviors and history of palpitations as well as stable and actively treated depression.  The patient has requested an evaluation to help determine whether he may also have attention deficit disorder.  The patient reports that in the recent past his mother and grandmother were both diagnosed with ADD and he would like to rule out that as part of his symptomatology to aid in more effective treatment planning and efforts.  Patient's current medications include 0.5 mg of lorazepam 2 times per day as needed for anxiety.  Patient has been treated in the past for depression with Lexapro, which made him dizzy and Wellbutrin which caused cognitive difficulties and "brain fog." ? ?Past medical history beyond anxiety and depression include obesity, chronic GERD, palpitations and history of colonic polyps. ? ?The patient reports that his difficulties really started in his early 23s and he felt that they were just due to depression.  The patient reports that more recently his mother and grandmother were diagnosed with ADD and he wanted to rule out whether this was part of his difficulties.  The patient reports that he does not remember really having any  attentional issues in elementary school but his family moved around a lot because of changing jobs with his father and patient changed schools and there was never a consistent early school that he attended.  By middle school, the patient noticed having increasing issues with fatigue and he discovered coffee as a self-medicating strategy.  Coffee then led to excessive use of energy drinks and reports that he became addicted to caffeine as well as likely other alkaloid type chemicals.  The patient reports that depression was not really an issue until his early 53s.  Between 15 and 72 years of age he lived with his grandmother and he helped out with chores around the house and this kept him quite busy.  However, by 20 he had moved out and depression began to be more of an issue and he reports anhedonia and lack of motivation to get things done.  Patient reports that eventually he just started to feel "numb."  He reports that he did not want to do anything and had no motivation.  Around 31 years of age he started abusing alcohol but engaged in the self-medication for less than a year.  He then went and sought help from his physician and they started looking at psychotropic medications.  Patient reports that while he does not remember the name of the medication he did take 1 first and it helped almost immediately and he became very motivated and active for about 2 weeks before it stopped working.  He has not had good responses to Lexapro or Wellbutrin previously.  The  patient reports that he took sertraline for about 4 years in his mid 70s and he felt like it helped but may have started to wane over time and he simply stopped taking it.  Right now he is not taking any psychotropics besides lorazepam for his anxiety. ? ?The patient reports that he has difficulty starting tasks and has difficulty completing them if he does start them.  He has no motivation for chores or taking care of himself.  He reports that he knows he  can physically do them but he just cannot get it started and he constantly feels like it is "too much" for him to achieve.  The patient has very little interest and motivation and outside activities in general.  He reports that the most severe symptoms started in his early 15s and he started having trouble with mood and motivation.  He reports that he got really depressed.  He denies feeling like he was suicidal but he just does not remember these times very well.  He does not think anything specific or traumatic happened during this time. ? ?The patient reports that he tends to stay up later that he knows that he should.  He reports that he does not have trouble sleeping.  The patient reports that he usually is in bed by 12 PM and gets up around 7 AM or little later on the weekends.  He reports that his appetite is fine and that his memory is fine. ? ?There has been some disconnect with his family during his adulthood.  The patient reports he does not feel like he speaks to his parents as much as he wishes but he and his family have started meeting monthly for family nights which has helped overall. ? ?There are some major stressors currently including change in his financial status and he reports that money is very tight for him right now.  His partner has not been able to work and is applying for disability so the patient is the only income for the household and reports that his work has become overwhelming at times and he is having trouble keeping up. ? ?Tests Administered: ?Comprehensive Attention Battery (CAB) ?Continuous Performance Test (CPT) ? ?Participation Level:   Active ? ?Participation Quality:  Appropriate   ?   ?Behavioral Observation:  The patient appeared well-groomed and appropriately dressed. His manners were polite and appropriate to the situation. He demonstrated a positive attitude toward testing and showed good effort. The patient did not have significant difficulty understanding testing  instructions. On one occasion during the test, the patient reported that he "spaced out" and needed instructions repeated. ? ?Well Groomed, Alert, and Appropriate.  ? ?Summary of Results:   As part of the overall neuropsychological evaluation the patient was administered the comprehensive attention battery and the CAB CPT continuous performance measure.  This provides a thorough and broad assessment of multiple domains of attention and concentration and executive functioning.  This allows for an objective measure of attention and concentration across both auditory and visual domains to assess for objective findings of specific attentional deficits. ? ?Initially, the patient was administered the pure auditory and visual reaction time measures.  This allows for both assessment of global arousal levels and global neurological integrity as well as providing a validity check as to effort and engagement in the overall assessment.  The patient did very well on these measures and well within normative expectations.  On the pure visual reaction time measure the patient correctly identified  48 of 50 targets with only 2 errors of omission and no multiple responses.  His average response time was 356 ms all of which were within normal limits.  On the pure auditory reaction time measure he correctly identified all 50 of 50 targets with no errors of omission and his average response time was 362 ms which is also well within normative expectations. ? ?On the discriminate reaction time measures which required the patient to make a decision of whether to respond or not and assess his impulsivity and ability to inhibit responses as well as rapidly process information the patient again did quite well relative to normative expectations.  For example, on the visual discriminate reaction time measure he correctly identified 33 of 35 targets with 0 errors of commission and only 2 errors of omission.  These accuracy scores were well within  normative expectations.  His average response time was 456 ms which is also well within normative expectations.  On the auditory discriminate reaction time very similar performance was noted.  Patient correctl

## 2021-10-09 NOTE — Telephone Encounter (Signed)
2nd attempt to reach patient.

## 2021-10-10 ENCOUNTER — Ambulatory Visit: Payer: BC Managed Care – PPO | Admitting: Family Medicine

## 2021-10-10 ENCOUNTER — Encounter: Payer: Self-pay | Admitting: Family Medicine

## 2021-10-10 ENCOUNTER — Other Ambulatory Visit: Payer: Self-pay | Admitting: Family Medicine

## 2021-10-10 VITALS — BP 150/73 | HR 85 | Temp 98.7°F | Wt 257.0 lb

## 2021-10-10 DIAGNOSIS — F331 Major depressive disorder, recurrent, moderate: Secondary | ICD-10-CM | POA: Diagnosis not present

## 2021-10-10 DIAGNOSIS — R030 Elevated blood-pressure reading, without diagnosis of hypertension: Secondary | ICD-10-CM | POA: Diagnosis not present

## 2021-10-10 DIAGNOSIS — R002 Palpitations: Secondary | ICD-10-CM | POA: Diagnosis not present

## 2021-10-10 DIAGNOSIS — F411 Generalized anxiety disorder: Secondary | ICD-10-CM | POA: Diagnosis not present

## 2021-10-10 MED ORDER — OMEPRAZOLE 20 MG PO CPDR: 20.0000 mg | DELAYED_RELEASE_CAPSULE | Freq: Every day | ORAL | 1 refills | Status: DC

## 2021-10-10 MED ORDER — METOPROLOL SUCCINATE ER 25 MG PO TB24: 12.5000 mg | ORAL_TABLET | Freq: Every day | ORAL | 1 refills | Status: DC

## 2021-10-10 MED ORDER — TAMSULOSIN HCL 0.4 MG PO CAPS: 0.4000 mg | ORAL_CAPSULE | Freq: Every day | ORAL | 1 refills | Status: DC

## 2021-10-10 MED ORDER — FLUOXETINE HCL 20 MG PO CAPS: 20.0000 mg | ORAL_CAPSULE | Freq: Every day | ORAL | 1 refills | Status: DC

## 2021-10-10 NOTE — Telephone Encounter (Signed)
3rd attempt to reach patient.

## 2021-10-10 NOTE — Assessment & Plan Note (Signed)
Under good control on current regimen. Continue current regimen. Continue to monitor. Call with any concerns. Refills given.   

## 2021-10-10 NOTE — Progress Notes (Signed)
BP (!) 150/73   Pulse 85   Temp 98.7 F (37.1 C)   Wt 257 lb (116.6 kg)   SpO2 98%   BMI 34.86 kg/m    Subjective:    Patient ID: Jacob [name], male    DOB: [birth year], 31 y.o.   MRN: [id number]  HPI: Jacob [name] is a 31 y.o. male  Chief Complaint  Patient presents with   Depression    Patient states he is tolerating fluoxetine '20mg'$  well, feels like it is working very well.    Anxiety   ANXIETY/DEPRESSION Duration: chronic Status:better Anxious mood: yes  Excessive worrying: yes Irritability: no  Sweating: no Nausea: no Palpitations:no Hyperventilation: no Panic attacks: no Agoraphobia: no  Obscessions/compulsions: no Depressed mood: yes    10/10/2021    8:13 AM 09/11/2021    2:14 PM 08/11/2021    4:25 PM 06/26/2021    1:11 PM 05/27/2021    8:36 AM  Depression screen PHQ 2/9  Decreased Interest 0 '1 3 1 1  '$ Down, Depressed, Hopeless 0 0 '3 1 2  '$ PHQ - 2 Score 0 '1 6 2 3  '$ Altered sleeping 1 1 0 1 1  Tired, decreased energy 0 '2 3 3 3  '$ Change in appetite 0 '3 2 1 3  '$ Feeling bad or failure about yourself  0 0 1 0 0  Trouble concentrating 0 2 0 2 2  Moving slowly or fidgety/restless 0 0 '3 3 1  '$ Suicidal thoughts 0 0 1 0 0  PHQ-9 Score '1 9 16 12 13  '$ Difficult doing work/chores  Somewhat difficult         10/10/2021    8:14 AM 09/11/2021    2:16 PM 08/11/2021    4:25 PM 05/27/2021    8:37 AM  GAD 7 : Generalized Anxiety Score  Nervous, Anxious, on Edge 1 0 0 3  Control/stop worrying 1 0 0 2  Worry too much - different things 0 0 0 1  Trouble relaxing 1 0 0 3  Restless '2 3 2 3  '$ Easily annoyed or irritable '1 1 1 2  '$ Afraid - awful might happen 1 0 0 3  Total GAD 7 Score '7 4 3 17  '$ Anxiety Difficulty Not difficult at all Somewhat difficult Not difficult at all Very difficult   Anhedonia: no Weight changes: no Insomnia: no   Hypersomnia: no Fatigue/loss of energy: no Feelings of worthlessness: no Feelings of guilt: no Impaired concentration/indecisiveness: no Suicidal  ideations: no  Crying spells: no Recent Stressors/Life Changes: no   Relationship problems: no   Family stress: no     Financial stress: no    Job stress: no    Recent death/loss: no  Relevant past medical, surgical, family and social history reviewed and updated as indicated. Interim medical history since our last visit reviewed. Allergies and medications reviewed and updated.  Review of Systems  Constitutional: Negative.   Respiratory: Negative.    Cardiovascular: Negative.   Musculoskeletal: Negative.   Psychiatric/Behavioral:  Positive for dysphoric mood. Negative for agitation, behavioral problems, confusion, decreased concentration, hallucinations, self-injury, sleep disturbance and suicidal ideas. The patient is nervous/anxious. The patient is not hyperactive.    Per HPI unless specifically indicated above     Objective:    BP (!) 150/73   Pulse 85   Temp 98.7 F (37.1 C)   Wt 257 lb (116.6 kg)   SpO2 98%   BMI 34.86 kg/m   Wt Readings from Last 3 Encounters:  10/10/21 257 lb (116.6 kg)  08/11/21 266 lb 6.4 oz (120.8 kg)  07/29/21 265 lb 9.6 oz (120.5 kg)    Physical Exam Vitals and nursing note reviewed.  Constitutional:      General: He is not in acute distress.    Appearance: Normal appearance. He is obese. He is not ill-appearing, toxic-appearing or diaphoretic.  HENT:     Head: Normocephalic and atraumatic.     Right Ear: External ear normal.     Left Ear: External ear normal.     Nose: Nose normal.     Mouth/Throat:     Mouth: Mucous membranes are moist.     Pharynx: Oropharynx is clear.  Eyes:     General: No scleral icterus.       Right eye: No discharge.        Left eye: No discharge.     Extraocular Movements: Extraocular movements intact.     Conjunctiva/sclera: Conjunctivae normal.     Pupils: Pupils are equal, round, and reactive to light.  Cardiovascular:     Rate and Rhythm: Normal rate and regular rhythm.     Pulses: Normal pulses.      Heart sounds: Normal heart sounds. No murmur heard.   No friction rub. No gallop.  Pulmonary:     Effort: Pulmonary effort is normal. No respiratory distress.     Breath sounds: Normal breath sounds. No stridor. No wheezing, rhonchi or rales.  Chest:     Chest wall: No tenderness.  Musculoskeletal:        General: Normal range of motion.     Cervical back: Normal range of motion and neck supple.  Skin:    General: Skin is warm and dry.     Capillary Refill: Capillary refill takes less than 2 seconds.     Coloration: Skin is not jaundiced or pale.     Findings: No bruising, erythema, lesion or rash.  Neurological:     General: No focal deficit present.     Mental Status: He is alert and oriented to person, place, and time. Mental status is at baseline.  Psychiatric:        Mood and Affect: Mood normal.        Behavior: Behavior normal.        Thought Content: Thought content normal.        Judgment: Judgment normal.    Results for orders placed or performed in visit on 07/29/21  Microscopic Examination   Urine  Result Value Ref Range   WBC, UA 0-5 0 - 5 /hpf   RBC >30 (H) 0 - 2 /hpf   Epithelial Cells (non renal) None seen 0 - 10 /hpf   Bacteria, UA Few (A) None seen/Few  CBC With Differential/Platelet  Result Value Ref Range   WBC 5.8 3.4 - 10.8 x10E3/uL   RBC 5.53 4.14 - 5.80 x10E6/uL   Hemoglobin 17.1 13.0 - 17.7 g/dL   Hematocrit 46.5 37.5 - 51.0 %   MCV 84 79 - 97 fL   MCH 30.9 26.6 - 33.0 pg   MCHC 36.8 (H) 31.5 - 35.7 g/dL   RDW 12.8 11.6 - 15.4 %   Platelets 201 150 - 450 x10E3/uL   Neutrophils 69 Not Estab. %   Lymphs 26 Not Estab. %   MID 6 Not Estab. %   Neutrophils Absolute 4.0 1.4 - 7.0 x10E3/uL   Lymphocytes Absolute 1.5 0.7 - 3.1 x10E3/uL   MID (Absolute) 0.3 0.1 -  1.6 X10E3/uL  Urinalysis, Routine w reflex microscopic  Result Value Ref Range   Specific Gravity, UA 1.020 1.005 - 1.030   pH, UA 6.5 5.0 - 7.5   Color, UA Yellow Yellow   Appearance  Ur Cloudy (A) Clear   Leukocytes,UA 1+ (A) Negative   Protein,UA 1+ (A) Negative/Trace   Glucose, UA Negative Negative   Ketones, UA Negative Negative   RBC, UA 3+ (A) Negative   Bilirubin, UA Negative Negative   Urobilinogen, Ur 0.2 0.2 - 1.0 mg/dL   Nitrite, UA Negative Negative   Microscopic Examination See below:       Assessment & Plan:   Problem List Items Addressed This Visit       Other   Depression - Primary    Feeling much better on the fluoxetine. Continue current regimen. Continue to monitor. Call with any concerns. Refills given.       Relevant Medications   FLUoxetine (PROZAC) 20 MG capsule   GAD (generalized anxiety disorder)    Feeling much better on the fluoxetine. Continue current regimen. Continue to monitor. Call with any concerns. Refills given.        Relevant Medications   FLUoxetine (PROZAC) 20 MG capsule   Palpitations    Under good control on current regimen. Continue current regimen. Continue to monitor. Call with any concerns. Refills given.         Other Visit Diagnoses     Elevated blood-pressure reading without diagnosis of hypertension       Will start monitoring BP at home. Call if >140/90, otherwise will bring in a log next visit.         Follow up plan: Return in about 6 months (around 04/12/2022).

## 2021-10-10 NOTE — Assessment & Plan Note (Signed)
Feeling much better on the fluoxetine. Continue current regimen. Continue to monitor. Call with any concerns. Refills given.

## 2021-10-10 NOTE — Telephone Encounter (Signed)
Requested medication (s) are due for refill today:   Yes  Requested medication (s) are on the active medication list:   Yes  Future visit scheduled:   Seen today.    Yes future visit scheduled   Last ordered: 10/10/2021 #90, 1 refill  Returned because a PA is needed for insurance   Requested Prescriptions  Pending Prescriptions Disp Refills   omeprazole (PRILOSEC) 20 MG capsule [Pharmacy Med Name: OMEPRAZOLE DR 20 MG CAPSULE] 90 capsule 1    Sig: TAKE 1 [place]     Gastroenterology: Proton Pump Inhibitors Passed - 10/10/2021  9:55 AM      Passed - Valid encounter within last 12 months    Recent Outpatient Visits           Today Moderate episode of recurrent major depressive disorder ([place])   [place], Megan P, DO   4 weeks ago Moderate episode of recurrent major depressive disorder ([place])   [place], Megan P, DO   2 months ago Moderate episode of recurrent major depressive disorder ([place])   [place], Megan P, DO   2 months ago Inguinal pain, unspecified laterality   [place], DO   3 months ago Routine general medical examination at a health care facility   [place], [place], DO       Future Appointments             In 6 months Wynetta Emery, Barb Merino, DO [employer]

## 2021-10-13 NOTE — Telephone Encounter (Signed)
PA initiated and submitted via Cover My Meds. Key: [id number]

## 2021-10-16 NOTE — Telephone Encounter (Signed)
PA was denied. No denial letter attached in Cover My Meds. Will await faxed letter to see if alternatives as listed.

## 2021-10-17 NOTE — Telephone Encounter (Signed)
Patient states he buys medication OTC and it works well for him. Does not need rx or alternative.

## 2022-01-21 ENCOUNTER — Ambulatory Visit: Payer: BC Managed Care – PPO | Admitting: Family Medicine

## 2022-01-21 ENCOUNTER — Encounter: Payer: Self-pay | Admitting: Family Medicine

## 2022-01-21 VITALS — BP 155/75 | HR 82 | Temp 97.9°F | Wt 240.5 lb

## 2022-01-21 DIAGNOSIS — F331 Major depressive disorder, recurrent, moderate: Secondary | ICD-10-CM

## 2022-01-21 DIAGNOSIS — F411 Generalized anxiety disorder: Secondary | ICD-10-CM

## 2022-01-21 DIAGNOSIS — E559 Vitamin D deficiency, unspecified: Secondary | ICD-10-CM | POA: Diagnosis not present

## 2022-01-21 MED ORDER — LORAZEPAM 0.5 MG PO TABS: 0.5000 mg | ORAL_TABLET | Freq: Two times a day (BID) | ORAL | 0 refills | Status: AC | PRN

## 2022-01-21 MED ORDER — FLUOXETINE HCL 40 MG PO CAPS: 40.0000 mg | ORAL_CAPSULE | Freq: Every day | ORAL | 1 refills | Status: DC

## 2022-01-21 NOTE — Assessment & Plan Note (Signed)
Not doing great. Will refer to psychiatry. Will check labs to look for organic cause. Increase prozac to '40mg'$  and give PRN lorazepam. Recheck 1 month.

## 2022-01-21 NOTE — Progress Notes (Signed)
BP (!) 155/75   Pulse 82   Temp 97.9 F (36.6 C)   Wt 240 lb 8 oz (109.1 kg)   SpO2 97%   BMI 32.62 kg/m    Subjective:    Patient ID: Jacob [name], male    DOB: 1991/04/10, 31 y.o.   MRN: [id number]  HPI: Jacob [name] is a 31 y.o. male  Chief Complaint  Patient presents with   Depression    Patient states he feels like fluoxetine is not working as well for him anymore. Feels like he is having a lot of bad days.    Anxiety   ANXIETY/DEPRESSION- was doing better for about 1.5 months, but slowly his mood has been getting worse with having worse trouble focusing and motivation, having more anxiety, having more bad days, having more panic Duration: chronic Status:exacerbated Anxious mood: yes  Excessive worrying: yes Irritability: no  Sweating: yes Nausea: yes Palpitations:no Hyperventilation: no Panic attacks: yes Agoraphobia: no  Obscessions/compulsions: no Depressed mood: yes    01/21/2022    8:52 AM 10/10/2021    8:13 AM 09/11/2021    2:14 PM 08/11/2021    4:25 PM 06/26/2021    1:11 PM  Depression screen PHQ 2/9  Decreased Interest 2 0 '1 3 1  '$ Down, Depressed, Hopeless 2 0 0 3 1  PHQ - 2 Score 4 0 '1 6 2  '$ Altered sleeping '3 1 1 '$ 0 1  Tired, decreased energy 3 0 '2 3 3  '$ Change in appetite 2 0 '3 2 1  '$ Feeling bad or failure about yourself  2 0 0 1 0  Trouble concentrating 3 0 2 0 2  Moving slowly or fidgety/restless 3 0 0 3 3  Suicidal thoughts 0 0 0 1 0  PHQ-9 Score '20 1 9 16 12  '$ Difficult doing work/chores Very difficult  Somewhat difficult        01/21/2022    8:52 AM 10/10/2021    8:14 AM 09/11/2021    2:16 PM 08/11/2021    4:25 PM  GAD 7 : Generalized Anxiety Score  Nervous, Anxious, on Edge 3 1 0 0  Control/stop worrying 3 1 0 0  Worry too much - different things 1 0 0 0  Trouble relaxing 3 1 0 0  Restless '3 2 3 2  '$ Easily annoyed or irritable '1 1 1 1  '$ Afraid - awful might happen 2 1 0 0  Total GAD 7 Score '16 7 4 3  '$ Anxiety Difficulty Somewhat difficult Not  difficult at all Somewhat difficult Not difficult at all   Anhedonia: no Weight changes: no Insomnia: no   Hypersomnia: no Fatigue/loss of energy: yes Feelings of worthlessness: no Feelings of guilt: no Impaired concentration/indecisiveness: no Suicidal ideations: no  Crying spells: no Recent Stressors/Life Changes: no   Relationship problems: no   Family stress: no     Financial stress: no    Job stress: no    Recent death/loss: no  Relevant past medical, surgical, family and social history reviewed and updated as indicated. Interim medical history since our last visit reviewed. Allergies and medications reviewed and updated.  Review of Systems  Constitutional:  Positive for fatigue. Negative for activity change, appetite change, chills, diaphoresis, fever and unexpected weight change.  Respiratory: Negative.    Cardiovascular: Negative.   Gastrointestinal:  Positive for nausea. Negative for abdominal distention, abdominal pain, anal bleeding, blood in stool, constipation, diarrhea, rectal pain and vomiting.  Psychiatric/Behavioral:  Positive for dysphoric mood and  sleep disturbance. Negative for agitation, behavioral problems, confusion, decreased concentration, hallucinations, self-injury and suicidal ideas. The patient is nervous/anxious. The patient is not hyperactive.     Per HPI unless specifically indicated above     Objective:    BP (!) 155/75   Pulse 82   Temp 97.9 F (36.6 C)   Wt 240 lb 8 oz (109.1 kg)   SpO2 97%   BMI 32.62 kg/m   Wt Readings from Last 3 Encounters:  01/21/22 240 lb 8 oz (109.1 kg)  10/10/21 257 lb (116.6 kg)  08/11/21 266 lb 6.4 oz (120.8 kg)    Physical Exam Vitals and nursing note reviewed.  Constitutional:      General: He is not in acute distress.    Appearance: Normal appearance. He is obese. He is not ill-appearing, toxic-appearing or diaphoretic.  HENT:     Head: Normocephalic and atraumatic.     Right Ear: External ear  normal.     Left Ear: External ear normal.     Nose: Nose normal.     Mouth/Throat:     Mouth: Mucous membranes are moist.     Pharynx: Oropharynx is clear.  Eyes:     General: No scleral icterus.       Right eye: No discharge.        Left eye: No discharge.     Extraocular Movements: Extraocular movements intact.     Conjunctiva/sclera: Conjunctivae normal.     Pupils: Pupils are equal, round, and reactive to light.  Cardiovascular:     Rate and Rhythm: Normal rate and regular rhythm.     Pulses: Normal pulses.     Heart sounds: Normal heart sounds. No murmur heard.    No friction rub. No gallop.  Pulmonary:     Effort: Pulmonary effort is normal. No respiratory distress.     Breath sounds: Normal breath sounds. No stridor. No wheezing, rhonchi or rales.  Chest:     Chest wall: No tenderness.  Musculoskeletal:        General: Normal range of motion.     Cervical back: Normal range of motion and neck supple.  Skin:    General: Skin is warm and dry.     Capillary Refill: Capillary refill takes less than 2 seconds.     Coloration: Skin is not jaundiced or pale.     Findings: No bruising, erythema, lesion or rash.  Neurological:     General: No focal deficit present.     Mental Status: He is alert and oriented to person, place, and time. Mental status is at baseline.  Psychiatric:        Mood and Affect: Mood normal.        Behavior: Behavior normal.        Thought Content: Thought content normal.        Judgment: Judgment normal.     Results for orders placed or performed in visit on 07/29/21  Microscopic Examination   Urine  Result Value Ref Range   WBC, UA 0-5 0 - 5 /hpf   RBC, Urine >30 (H) 0 - 2 /hpf   Epithelial Cells (non renal) None seen 0 - 10 /hpf   Bacteria, UA Few (A) None seen/Few  CBC With Differential/Platelet  Result Value Ref Range   WBC 5.8 3.4 - 10.8 x10E3/uL   RBC 5.53 4.14 - 5.80 x10E6/uL   Hemoglobin 17.1 13.0 - 17.7 g/dL   Hematocrit 46.5  37.5 - 51.0 %  MCV 84 79 - 97 fL   MCH 30.9 26.6 - 33.0 pg   MCHC 36.8 (H) 31.5 - 35.7 g/dL   RDW 12.8 11.6 - 15.4 %   Platelets 201 150 - 450 x10E3/uL   Neutrophils 69 Not Estab. %   Lymphs 26 Not Estab. %   MID 6 Not Estab. %   Neutrophils Absolute 4.0 1.4 - 7.0 x10E3/uL   Lymphocytes Absolute 1.5 0.7 - 3.1 x10E3/uL   MID (Absolute) 0.3 0.1 - 1.6 X10E3/uL  Urinalysis, Routine w reflex microscopic  Result Value Ref Range   Specific Gravity, UA 1.020 1.005 - 1.030   pH, UA 6.5 5.0 - 7.5   Color, UA Yellow Yellow   Appearance Ur Cloudy (A) Clear   Leukocytes,UA 1+ (A) Negative   Protein,UA 1+ (A) Negative/Trace   Glucose, UA Negative Negative   Ketones, UA Negative Negative   RBC, UA 3+ (A) Negative   Bilirubin, UA Negative Negative   Urobilinogen, Ur 0.2 0.2 - 1.0 mg/dL   Nitrite, UA Negative Negative   Microscopic Examination See below:       Assessment & Plan:   Problem List Items Addressed This Visit       Other   Depression - Primary    Not doing great. Will refer to psychiatry. Will check labs to look for organic cause. Increase prozac to '40mg'$  and give PRN lorazepam. Recheck 1 month.      Relevant Medications   FLUoxetine (PROZAC) 40 MG capsule   LORazepam (ATIVAN) 0.5 MG tablet   Other Relevant Orders   Ambulatory referral to Psychiatry   Comprehensive metabolic panel   CBC with Differential/Platelet   VITAMIN D 25 Hydroxy (Vit-D Deficiency, Fractures)   TSH   GAD (generalized anxiety disorder)    See discussion under depression.      Relevant Medications   FLUoxetine (PROZAC) 40 MG capsule   LORazepam (ATIVAN) 0.5 MG tablet   Other Relevant Orders   Ambulatory referral to Psychiatry   Comprehensive metabolic panel   CBC with Differential/Platelet   VITAMIN D 25 Hydroxy (Vit-D Deficiency, Fractures)   TSH     Follow up plan: Return in about 4 weeks (around 02/18/2022).

## 2022-01-21 NOTE — Assessment & Plan Note (Signed)
See discussion under depression.  ?

## 2022-01-22 LAB — COMPREHENSIVE METABOLIC PANEL
ALT: 17 IU/L (ref 0–44)
AST: 17 IU/L (ref 0–40)
Albumin/Globulin Ratio: 2.6 — ABNORMAL HIGH (ref 1.2–2.2)
Albumin: 4.9 g/dL (ref 4.3–5.2)
Alkaline Phosphatase: 106 IU/L (ref 44–121)
BUN/Creatinine Ratio: 11 (ref 9–20)
BUN: 12 mg/dL (ref 6–20)
Bilirubin Total: 0.6 mg/dL (ref 0.0–1.2)
CO2: 24 mmol/L (ref 20–29)
Calcium: 9.9 mg/dL (ref 8.7–10.2)
Chloride: 104 mmol/L (ref 96–106)
Creatinine, Ser: 1.1 mg/dL (ref 0.76–1.27)
Globulin, Total: 1.9 g/dL (ref 1.5–4.5)
Glucose: 98 mg/dL (ref 70–99)
Potassium: 4.5 mmol/L (ref 3.5–5.2)
Sodium: 144 mmol/L (ref 134–144)
Total Protein: 6.8 g/dL (ref 6.0–8.5)
eGFR: 93 mL/min/{1.73_m2} (ref >59)

## 2022-01-22 LAB — CBC WITH DIFFERENTIAL/PLATELET
Basophils Absolute: 0 10*3/uL (ref 0.0–0.2)
Basos: 0 %
EOS (ABSOLUTE): 0.1 10*3/uL (ref 0.0–0.4)
Eos: 1 %
Hematocrit: 48.7 % (ref 37.5–51.0)
Hemoglobin: 16.7 g/dL (ref 13.0–17.7)
Immature Grans (Abs): 0 10*3/uL (ref 0.0–0.1)
Immature Granulocytes: 0 %
Lymphocytes Absolute: 1.4 10*3/uL (ref 0.7–3.1)
Lymphs: 24 %
MCH: 30.8 pg (ref 26.6–33.0)
MCHC: 34.3 g/dL (ref 31.5–35.7)
MCV: 90 fL (ref 79–97)
Monocytes Absolute: 0.3 10*3/uL (ref 0.1–0.9)
Monocytes: 5 %
Neutrophils Absolute: 4 10*3/uL (ref 1.4–7.0)
Neutrophils: 70 %
Platelets: 193 10*3/uL (ref 150–450)
RBC: 5.42 x10E6/uL (ref 4.14–5.80)
RDW: 12.4 % (ref 11.6–15.4)
WBC: 5.8 10*3/uL (ref 3.4–10.8)

## 2022-01-22 LAB — VITAMIN D 25 HYDROXY (VIT D DEFICIENCY, FRACTURES): Vit D, 25-Hydroxy: 31.7 ng/mL (ref 30.0–100.0)

## 2022-01-22 LAB — TSH: TSH: 2.06 u[IU]/mL (ref 0.450–4.500)

## 2022-01-29 ENCOUNTER — Ambulatory Visit: Payer: BC Managed Care – PPO | Admitting: Psychology

## 2022-02-03 ENCOUNTER — Encounter: Payer: BC Managed Care – PPO | Admitting: Psychology

## 2022-02-19 ENCOUNTER — Encounter: Payer: Self-pay | Admitting: Family Medicine

## 2022-02-19 ENCOUNTER — Ambulatory Visit: Payer: BC Managed Care – PPO | Admitting: Family Medicine

## 2022-02-19 VITALS — BP 121/77 | HR 69 | Temp 98.0°F | Wt 243.5 lb

## 2022-02-19 DIAGNOSIS — F411 Generalized anxiety disorder: Secondary | ICD-10-CM | POA: Diagnosis not present

## 2022-02-19 DIAGNOSIS — Z23 Encounter for immunization: Secondary | ICD-10-CM

## 2022-02-19 DIAGNOSIS — F331 Major depressive disorder, recurrent, moderate: Secondary | ICD-10-CM | POA: Diagnosis not present

## 2022-02-19 MED ORDER — FLUOXETINE HCL 40 MG PO CAPS: 40.0000 mg | ORAL_CAPSULE | Freq: Every day | ORAL | 1 refills | Status: DC

## 2022-02-19 NOTE — Assessment & Plan Note (Signed)
Seeing psych in about a month. Will increase his fluoxetine to '60mg'$  (has pills at home so will not send in new Rx). Call with any concerns. Continue to monitor.

## 2022-02-19 NOTE — Progress Notes (Signed)
BP 121/77   Pulse 69   Temp 98 F (36.7 C)   Wt 243 lb 8 oz (110.5 kg)   SpO2 99%   BMI 33.02 kg/m    Subjective:    Patient ID: Jacob [name], male    DOB: [birth year], 31 y.o.   MRN: [id number]  HPI: Jacob [name] is a 31 y.o. male  Chief Complaint  Patient presents with   Anxiety    Patient states medication worked well for 1 and half weeks but since then seems like it is not helping as much. Has apt with psychiatry in November.    Depression   ANXIETY/DEPRESSION- felt like he was doing better for a week or so then felt like his medicine hasn't been helping as much. He does think his anxiety has been doing better Duration: chronic Status:stable Anxious mood: no  Excessive worrying: no Irritability: no  Sweating: no Nausea: no Palpitations:no Hyperventilation: no Panic attacks: no Agoraphobia: no  Obscessions/compulsions: no Depressed mood: yes    02/19/2022    8:56 AM 01/21/2022    8:52 AM 10/10/2021    8:13 AM 09/11/2021    2:14 PM 08/11/2021    4:25 PM  Depression screen PHQ 2/9  Decreased Interest 2 2 0 1 3  Down, Depressed, Hopeless 3 2 0 0 3  PHQ - 2 Score 5 4 0 1 6  Altered sleeping $RemoveBeforeDE'1 3 1 1 'woOtLadaUxJSifD$ 0  Tired, decreased energy 3 3 0 2 3  Change in appetite 3 2 0 3 2  Feeling bad or failure about yourself  2 2 0 0 1  Trouble concentrating 2 3 0 2 0  Moving slowly or fidgety/restless 0 3 0 0 3  Suicidal thoughts 0 0 0 0 1  PHQ-9 Score $RemoveBef'16 20 1 9 16  'PfIMvIwzox$ Difficult doing work/chores Very difficult Very difficult  Somewhat difficult       02/19/2022    8:57 AM 01/21/2022    8:52 AM 10/10/2021    8:14 AM 09/11/2021    2:16 PM  GAD 7 : Generalized Anxiety Score  Nervous, Anxious, on Edge $Remov'1 3 1 'KCYtVZ$ 0  Control/stop worrying 0 3 1 0  Worry too much - different things 0 1 0 0  Trouble relaxing 0 3 1 0  Restless $RemoveB'1 3 2 3  'kGVNxOVH$ Easily annoyed or irritable $RemoveBefo'1 1 1 1  'uXaGHWxUHcZ$ Afraid - awful might happen $RemoveBefo'1 2 1 'qhExjpqSdmj$ 0  Total GAD 7 Score $Remov'4 16 7 4  'QbjBSb$ Anxiety Difficulty Not difficult at all Somewhat difficult  Not difficult at all Somewhat difficult   Anhedonia: no Weight changes: no Insomnia: no   Hypersomnia: no Fatigue/loss of energy: yes Feelings of worthlessness: no Feelings of guilt: no Impaired concentration/indecisiveness: yes Suicidal ideations: no  Crying spells: no Recent Stressors/Life Changes: no   Relationship problems: no   Family stress: no     Financial stress: no    Job stress: no    Recent death/loss: no   Relevant past medical, surgical, family and social history reviewed and updated as indicated. Interim medical history since our last visit reviewed. Allergies and medications reviewed and updated.  Review of Systems  Constitutional:  Positive for fatigue. Negative for activity change, appetite change, chills, diaphoresis, fever and unexpected weight change.  HENT: Negative.    Respiratory: Negative.    Cardiovascular: Negative.   Gastrointestinal: Negative.   Musculoskeletal: Negative.   Psychiatric/Behavioral:  Positive for dysphoric mood. Negative for agitation, behavioral problems, confusion, decreased concentration, hallucinations,  self-injury, sleep disturbance and suicidal ideas. The patient is nervous/anxious. The patient is not hyperactive.     Per HPI unless specifically indicated above     Objective:    BP 121/77   Pulse 69   Temp 98 F (36.7 C)   Wt 243 lb 8 oz (110.5 kg)   SpO2 99%   BMI 33.02 kg/m   Wt Readings from Last 3 Encounters:  02/19/22 243 lb 8 oz (110.5 kg)  01/21/22 240 lb 8 oz (109.1 kg)  10/10/21 257 lb (116.6 kg)    Physical Exam Vitals and nursing note reviewed.  Constitutional:      General: He is not in acute distress.    Appearance: Normal appearance. He is obese. He is not ill-appearing, toxic-appearing or diaphoretic.  HENT:     Head: Normocephalic and atraumatic.     Right Ear: External ear normal.     Left Ear: External ear normal.     Nose: Nose normal.     Mouth/Throat:     Mouth: Mucous membranes are  moist.     Pharynx: Oropharynx is clear.  Eyes:     General: No scleral icterus.       Right eye: No discharge.        Left eye: No discharge.     Extraocular Movements: Extraocular movements intact.     Conjunctiva/sclera: Conjunctivae normal.     Pupils: Pupils are equal, round, and reactive to light.  Cardiovascular:     Rate and Rhythm: Normal rate and regular rhythm.     Pulses: Normal pulses.     Heart sounds: Normal heart sounds. No murmur heard.    No friction rub. No gallop.  Pulmonary:     Effort: Pulmonary effort is normal. No respiratory distress.     Breath sounds: Normal breath sounds. No stridor. No wheezing, rhonchi or rales.  Chest:     Chest wall: No tenderness.  Musculoskeletal:        General: Normal range of motion.     Cervical back: Normal range of motion and neck supple.  Skin:    General: Skin is warm and dry.     Capillary Refill: Capillary refill takes less than 2 seconds.     Coloration: Skin is not jaundiced or pale.     Findings: No bruising, erythema, lesion or rash.  Neurological:     General: No focal deficit present.     Mental Status: He is alert and oriented to person, place, and time. Mental status is at baseline.  Psychiatric:        Mood and Affect: Mood normal.        Behavior: Behavior normal.        Thought Content: Thought content normal.        Judgment: Judgment normal.     Results for orders placed or performed in visit on 01/21/22  Comprehensive metabolic panel  Result Value Ref Range   Glucose 98 70 - 99 mg/dL   BUN 12 6 - 20 mg/dL   Creatinine, Ser 1.10 0.76 - 1.27 mg/dL   eGFR 93 >59 mL/min/1.73   BUN/Creatinine Ratio 11 9 - 20   Sodium 144 134 - 144 mmol/L   Potassium 4.5 3.5 - 5.2 mmol/L   Chloride 104 96 - 106 mmol/L   CO2 24 20 - 29 mmol/L   Calcium 9.9 8.7 - 10.2 mg/dL   Total Protein 6.8 6.0 - 8.5 g/dL   Albumin 4.9 4.3 -  5.2 g/dL   Globulin, Total 1.9 1.5 - 4.5 g/dL   Albumin/Globulin Ratio 2.6 (H) 1.2 -  2.2   Bilirubin Total 0.6 0.0 - 1.2 mg/dL   Alkaline Phosphatase 106 44 - 121 IU/L   AST 17 0 - 40 IU/L   ALT 17 0 - 44 IU/L  CBC with Differential/Platelet  Result Value Ref Range   WBC 5.8 3.4 - 10.8 x10E3/uL   RBC 5.42 4.14 - 5.80 x10E6/uL   Hemoglobin 16.7 13.0 - 17.7 g/dL   Hematocrit 48.7 37.5 - 51.0 %   MCV 90 79 - 97 fL   MCH 30.8 26.6 - 33.0 pg   MCHC 34.3 31.5 - 35.7 g/dL   RDW 12.4 11.6 - 15.4 %   Platelets 193 150 - 450 x10E3/uL   Neutrophils 70 Not Estab. %   Lymphs 24 Not Estab. %   Monocytes 5 Not Estab. %   Eos 1 Not Estab. %   Basos 0 Not Estab. %   Neutrophils Absolute 4.0 1.4 - 7.0 x10E3/uL   Lymphocytes Absolute 1.4 0.7 - 3.1 x10E3/uL   Monocytes Absolute 0.3 0.1 - 0.9 x10E3/uL   EOS (ABSOLUTE) 0.1 0.0 - 0.4 x10E3/uL   Basophils Absolute 0.0 0.0 - 0.2 x10E3/uL   Immature Granulocytes 0 Not Estab. %   Immature Grans (Abs) 0.0 0.0 - 0.1 x10E3/uL  VITAMIN D 25 Hydroxy (Vit-D Deficiency, Fractures)  Result Value Ref Range   Vit D, 25-Hydroxy 31.7 30.0 - 100.0 ng/mL  TSH  Result Value Ref Range   TSH 2.060 0.450 - 4.500 uIU/mL      Assessment & Plan:   Problem List Items Addressed This Visit       Other   Depression    Seeing psych in about a month. Will increase his fluoxetine to $RemoveBefor'60mg'aIdknlmAlZxw$  (has pills at home so will not send in new Rx). Call with any concerns. Continue to monitor.       Relevant Medications   FLUoxetine (PROZAC) 20 MG capsule   FLUoxetine (PROZAC) 40 MG capsule   GAD (generalized anxiety disorder)    Seeing psych in about a month. Will increase his fluoxetine to $RemoveBefor'60mg'bPPKcvaqLJHe$  (has pills at home so will not send in new Rx). Call with any concerns. Continue to monitor.       Relevant Medications   FLUoxetine (PROZAC) 20 MG capsule   FLUoxetine (PROZAC) 40 MG capsule   Other Visit Diagnoses     Need for influenza vaccination    -  Primary   Relevant Orders   Flu Vaccine QUAD 6+ mos PF IM (Fluarix Quad PF) (Completed)        Follow up  plan: Return in about 4 weeks (around 03/19/2022).

## 2022-03-26 ENCOUNTER — Other Ambulatory Visit
Admission: RE | Admit: 2022-03-26 | Discharge: 2022-03-26 | Disposition: A | Discharge status: Home / Self Care | Payer: BC Managed Care – PPO | Source: Ambulatory Visit | Attending: Psychiatry | Admitting: Psychiatry

## 2022-03-26 ENCOUNTER — Ambulatory Visit: Payer: BC Managed Care – PPO | Admitting: Psychiatry

## 2022-03-26 ENCOUNTER — Encounter: Payer: Self-pay | Admitting: Psychiatry

## 2022-03-26 VITALS — BP 146/82 | HR 102 | Temp 98.8°F | Ht 72.0 in | Wt 248.8 lb

## 2022-03-26 DIAGNOSIS — F32A Depression, unspecified: Secondary | ICD-10-CM

## 2022-03-26 DIAGNOSIS — Z79899 Other long term (current) drug therapy: Secondary | ICD-10-CM

## 2022-03-26 DIAGNOSIS — Z9189 Other specified personal risk factors, not elsewhere classified: Secondary | ICD-10-CM

## 2022-03-26 DIAGNOSIS — F1290 Cannabis use, unspecified, uncomplicated: Secondary | ICD-10-CM | POA: Insufficient documentation

## 2022-03-26 DIAGNOSIS — F4010 Social phobia, unspecified: Secondary | ICD-10-CM | POA: Insufficient documentation

## 2022-03-26 DIAGNOSIS — F1011 Alcohol abuse, in remission: Secondary | ICD-10-CM

## 2022-03-26 NOTE — Patient Instructions (Signed)
Please call for EKG - [place]   [email or web address]  [email or web address]  [email or web address] [address] [place], [place] [address]   [phone]  [facility], [email or web address] [address] [place], [place] [address]  [phone]   [facility] - [phone]  [facility] - [phone]  [facility] - [place] [phone]  [facility] - [phone]       Cannabis Use Disorder Cannabis use disorder occurs when marijuana use disrupts a person's daily life or causes health problems. This condition can be dangerous. The health problems this condition can cause include: Long-lasting problems with thinking and learning. These can be permanent in young people. Mental health problems, such as anxiety disorders, paranoia, psychosis, or schizophrenia. Dangerously high blood pressure and heart rate. Breathing problems and illness, such as bronchitis, emphysema, or lung cancer. Problems with fetal development during pregnancy and child development after pregnancy. People with this condition are also more likely to use other drugs. What are the causes? This condition is caused by using marijuana too much over time. It is not caused by using it only once in a while. Many people with this condition use marijuana because it gives them a feeling of extreme pleasure or relaxation. What increases the risk? The following factors may make a person more likely to develop this condition: Being male. Having a family history of cannabis use disorder. Having mental health issues such as depression or post-traumatic stress disorder (PTSD). What are the signs or symptoms? Symptoms of this condition include: Addiction Using marijuana in greater amounts or for longer periods of time than you want to. Craving marijuana. Spending a lot of time getting  marijuana, using it, or recovering from its effects. Having problems at work, at school, at home, or in relationships because of marijuana use. Giving up or cutting down on important life activities because of marijuana use. Using marijuana at times when it is dangerous, such as while you are driving a car. Needing more and more marijuana to get the same desired effect (building up a tolerance). Lack of motivation, known as amotivational syndrome, which leads to poor school and work performance. Physical problems A long-lasting cough. Long-term lung problems and difficulty breathing. Mental problems Hallucinations. Severe anxiety. Trouble sleeping. Increase in violent behavior in young people. Withdrawal problems You may have symptoms when you stop using marijuana. Symptoms include: Irritability or anger. Anxiety or restlessness. Trouble sleeping. Loss of appetite or weight loss. Aches and pains. Shakiness, sweating, or chills. How is this diagnosed? This condition is diagnosed with an assessment. Your health care provider will ask about your marijuana use and how it affects your life. You will be diagnosed with the condition if you have had at least two symptoms of this condition within a 72-monthperiod. How severe the condition is depends on how many symptoms you have. If you have two to three symptoms, your condition is mild. If you have four to five symptoms, your condition is moderate. If you have six or more symptoms, your condition is severe. A physical exam or lab tests may be done to see if you have physical problems resulting from marijuana use. Your health care provider may also screen for drug use and refer you to a mental health professional for evaluation. How is this treated? Treatment for this condition is usually provided by mental health professionals with training in substance use disorders.  Your treatment may involve: Counseling. This treatment is also called talk  therapy. It is provided by substance use treatment counselors. A counselor can address the reasons you use marijuana and suggest ways to keep you from using it again. The goals of talk therapy are to: Find healthy activities to replace using marijuana. Identify and avoid the things that trigger your marijuana use. Help you learn how to handle cravings. Support groups. Support groups are led by people who have quit using marijuana. They provide emotional support, advice, and guidance. Medicine. Medicine is used to treat mental health issues that trigger marijuana use or that result from it. Follow these instructions at home: Lifestyle Make healthy lifestyle choices, such as: Eating a healthy diet. Getting enough exercise. Learning skills for managing stress.  General instructions Take over-the-counter and prescription medicines, as well as any herbal remedies, only as told by your health care provider. Check with your health care provider before starting any new medicines. Work with Higher education careers adviser or group to develop tools to keep you from using marijuana again (relapsing). Learn daily living skills and work Psychologist, occupational. Where to find more information Centers for Disease Control and Prevention (CDC): [employer] Substance Abuse and Mental Health Services Administration: [employer].cy Contact a health care provider if: You are not able to take your medicines as told. Your symptoms get worse. Get help right away if: You have serious thoughts about hurting yourself or others. Get help right away if you feel like you may hurt yourself or others, or have thoughts about taking your own life. Go to your nearest emergency room or: Call 911. Call the [facility] at (640) 871-2463 or 988. This is open 24 hours a day. Text the Crisis Text Line at 8135573952. This information is not intended to replace advice given to you by your health care provider. Make sure you discuss any questions you  have with your health care provider. Document Revised: 10/15/2021 Document Reviewed: 10/15/2021 Elsevier Patient Education  [place].

## 2022-03-26 NOTE — Progress Notes (Signed)
Psychiatric Initial Adult Assessment   Patient Identification: Jacob [name] MRN:  [id number] Date of Evaluation:  03/26/2022 Referral Source: Dr. Park Liter Chief Complaint:   Chief Complaint  Patient presents with   Establish Care   Anxiety   Depression   Visit Diagnosis:    ICD-10-CM   1. Depression, unspecified depression type  F32.A EKG 12-Lead    Urine drugs of abuse scrn w alc, routine (Ref Lab)    2. Social anxiety disorder  F40.10 EKG 12-Lead    Urine drugs of abuse scrn w alc, routine (Ref Lab)    3. Long term current use of cannabis  F12.90 EKG 12-Lead    Urine drugs of abuse scrn w alc, routine (Ref Lab)    4. High risk medication use  Z79.899 EKG 12-Lead    Urine drugs of abuse scrn w alc, routine (Ref Lab)    5. At risk for prolonged QT interval syndrome  Z91.89 EKG 12-Lead    6. History of alcohol abuse  F10.11       History of Present Illness:  Jacob [name] is a 31 year old Caucasian male, employed, lives with his significant other in [place], has a history of depression, anxiety, attention and concentration deficit, tachycardia, chronic GERD, history of colonic polyps, obesity, was evaluated in office today, presented to establish care.  Patient reports he has been struggling with depression which started in his early 38s.  Patient describes his depression symptoms as sadness, anhedonia, no motivation, low energy, lack of appetite sometimes and impulsive eating habits at other times, feeling bad about himself, concentration problem, and so on.  Patient reports he is currently on Prozac which may have been started by his primary care provider in spring 2023.  Started at 20 mg which helped him for a few weeks and then the effect plateaued.  Patient reports his primary care provider increased the Prozac to a 40 mg and later on was 60 mg.  He has not noticed much difference with the 60 yet, dose change was made couple of months ago.  Patient reports he has had other  medication trials in the past like Lexapro, Wellbutrin and sertraline and none of these medications worked and he had side effects to the Lexapro and Wellbutrin.  Patient reports he currently has depressive symptoms daily to the point that he showers only once a week, does not brush his teeth, uses a mouthwash instead (has done it all his life), does not have much social interaction.  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  Patient also reports anxiety symptoms, does not call himself a worrier however struggles with social anxiety.  Reports he does not even go out to his yard since he is worried about what his neighbors would think .  Patient has never been in psychotherapy before.  Patient does have lorazepam available prescribed by primary care provider which she uses as needed for severe anxiety attacks.  Patient does report a history of trauma.  Reports as a child he had to move around a lot due to his dad being sick.  His memories about his dad are him being hospitalized back and forth.  Patient reports he also had to live with other family members like his grandparents and aunts and uncles on and off several times.  Patient reports between kindergarten and sixth grade  he had to change his school every year due to his parents moving and his dad being sick.  Finding his dad passed away when he was in sixth grade.  Patient reports that was traumatic to him.  Patient also reports his brother passed away due to same medical problems as his dad, immunodeficiency, a few years ago.  Patient continues to grieve his loss and became tearful when he discussed this.  Patient reports he struggles with concentration problem, ADHD runs in his family.  Patient reports he procrastinates a lot and has motivation problems.  Patient reports he has had neuropsychological testing done-I have  reviewed notes per Dr. Lissa Merlin recent notes-10/08/2021.  Based on the testing patient did not meet criteria for ADHD.  Patient however reports he does not believe the testing was right however does not want to be referred for another testing at this time.  Patient denies any OCD symptoms.  Denies any manic or hypomanic symptoms.  Patient does report using cannabis on a regular basis.  Reports he uses cannabis through a vape, has been using every day since the age of 64.  Patient reports he uses delta 8 and gets high every night.  Patient reports he is willing to stop the cannabis if he needs to do that to get treated for his depression and anxiety.  Patient currently denies any withdrawal symptoms.  Denies any other drug abuse at this time however does report a history of heavy alcoholism in the past.  Patient denies any suicidality, homicidality or perceptual disturbances.       Associated Signs/Symptoms: Depression Symptoms:  depressed mood, anhedonia, feelings of worthlessness/guilt, difficulty concentrating, impaired memory, anxiety, loss of energy/fatigue, (Hypo) Manic Symptoms:   Denies Anxiety Symptoms:  Panic Symptoms, Social Anxiety, Psychotic Symptoms:   Denies PTSD Symptoms: Had a traumatic exposure:  as noted above  Past Psychiatric History: Patient denies inpatient behavioral health admissions.  Patient was treated by his primary care provider for depression and anxiety-Dr. MeganJohnson. Neuropsychological testing completed by Dr. Tommi Rumps 10/08/2021-reviewed notes-patient did not meet criteria for ADHD-was diagnosed with major depression, generalized anxiety disorder.  Previous Psychotropic Medications: Yes sertraline, Lexapro-side effect, Wellbutrin-side effect  Substance Abuse History in the last 12 months:  Yes.  Cannabis every day, unable to give quantity,vapes it ,since the age of 66 year. History of alcoholism-heavy abuse early to mid 20s-4-5 shots  every day, used to get drunk may have had blackout once otherwise denies any other withdrawal symptoms denies any legal problems.  Reports he stopped abusing alcohol around the age of 48.  Denies ever being in a drug treatment program or AA meetings.  Consequences of Substance Abuse: Likely causing mood symptoms  Past Medical History:  Past Medical History:  Diagnosis Date   Anxiety    Depression     Past Surgical History:  Procedure Laterality Date   COLONOSCOPY WITH PROPOFOL N/A 06/04/2021   Procedure: COLONOSCOPY WITH PROPOFOL;  Surgeon: Lin Landsman, MD;  Location: [facility] ENDOSCOPY;  Service: Gastroenterology;  Laterality: N/A;   ESOPHAGOGASTRODUODENOSCOPY (EGD) WITH PROPOFOL N/A 09/11/2020   Procedure: ESOPHAGOGASTRODUODENOSCOPY (EGD) WITH PROPOFOL;  Surgeon: Lin Landsman, MD;  Location: [facility] ENDOSCOPY;  Service: Gastroenterology;  Laterality: N/A;   FLEXIBLE SIGMOIDOSCOPY N/A 09/11/2020   Procedure: FLEXIBLE SIGMOIDOSCOPY;  Surgeon: Lin Landsman, MD;  Location: [facility] ENDOSCOPY;  Service: Gastroenterology;  Laterality: N/A;    Family Psychiatric History: As noted below.  Family History:  Family History  Problem Relation Age of Onset  ADD / ADHD Mother    Diabetes Mother    Early death Father    Autoimmune disease Father    Autoimmune disease Sister    Early death Brother    Autoimmune disease Brother    ADD / ADHD Maternal Grandmother    Autism spectrum disorder Half-Brother     Social History:   Social History   Socioeconomic History   Marital status: Soil scientist    Spouse name: Not on file   Number of children: Not on file   Years of education: Not on file   Highest education level: Some college, no degree  Occupational History   Not on file  Tobacco Use   Smoking status: Never   Smokeless tobacco: Never  Vaping Use   Vaping Use: Some days   Substances: THC  Substance and Sexual Activity   Alcohol use: Yes    Alcohol/week: 1.0  standard drink of alcohol    Types: 1 Glasses of wine per week    Comment: Socially   Drug use: Yes    Types: Marijuana    Comment: daily   Sexual activity: Yes    Birth control/protection: None  Other Topics Concern   Not on file  Social History Narrative   Not on file   Social Determinants of Health   Financial Resource Strain: Not on file  Food Insecurity: Not on file  Transportation Needs: Not on file  Physical Activity: Not on file  Stress: Not on file  Social Connections: Not on file    Additional Social History: Patient was born and raised in [place].  Patient reports he moved around a lot since his dad was sick and had to live with several family members and had to change schools every year from kindergarten to sixth grade.  Patient's father passed away due to medical illness when he was in sixth grade.  Patient has 3 biological siblings living, 2 stepsiblings and one half brother.  Patient reports after his father passed he was raised by his mother and stepdad.  One of his brothers had the same medical illness as his dad, they passed away a few years ago.  Patient graduated high school and did some college.  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 since the past 6 years.  Patient is in a relationship with his significant other since the past 6 years.  Patient denies legal issues.  Currently lives in [place].  Denies being in [employer]  Allergies:   Allergies  Allergen Reactions   Lexapro [Escitalopram]     Made patient dizzy   Wellbutrin [Bupropion] Other (See Comments)    Brain fog    Metabolic Disorder Labs: Lab Results  Component Value Date   HGBA1C 4.9 01/26/2018   No results found for: "PROLACTIN" Lab Results  Component Value Date   CHOL 184 06/26/2021   TRIG 165 (H) 06/26/2021   HDL 40 06/26/2021   LDLCALC 115 (H) 06/26/2021   LDLCALC 110 (H) 08/22/2020   Lab Results  Component Value Date   TSH 2.060 01/21/2022     Therapeutic Level Labs: No results found for: "LITHIUM" No results found for: "CBMZ" No results found for: "VALPROATE"  Current Medications: Current Outpatient Medications  Medication Sig Dispense Refill   FLUoxetine (PROZAC) 20 MG capsule Take 1 capsule (20 mg total) by mouth daily. Take with '40mg'$  for '60mg'$  total daily     FLUoxetine (PROZAC) 40 MG capsule Take 1 capsule (40 mg total) by  mouth daily. Take with '20mg'$  for '60mg'$  total daily 90 capsule 1   metoprolol succinate (TOPROL-XL) 25 MG 24 hr tablet Take 0.5 tablets (12.5 mg total) by mouth daily. 45 tablet 1   omeprazole (PRILOSEC) 20 MG capsule Take 1 capsule (20 mg total) by mouth daily. 90 capsule 1   tamsulosin (FLOMAX) 0.4 MG CAPS capsule Take 1 capsule (0.4 mg total) by mouth daily. 90 capsule 1   LORazepam (ATIVAN) 0.5 MG tablet Take 1 tablet (0.5 mg total) by mouth 2 (two) times daily as needed for anxiety. (Patient not taking: Reported on 03/26/2022) 20 tablet 0   No current facility-administered medications for this visit.    Musculoskeletal: Strength & Muscle Tone: within normal limits Gait & Station: normal Patient leans: N/A  Psychiatric Specialty Exam: Review of Systems  Psychiatric/Behavioral:  Positive for decreased concentration, dysphoric mood and sleep disturbance. The patient is nervous/anxious.   All other systems reviewed and are negative.   Blood pressure (!) 146/82, pulse (!) 102, temperature 98.8 F (37.1 C), temperature source Oral, height 6' (1.829 m), weight 248 lb 12.8 oz (112.9 kg).Body mass index is 33.74 kg/m.  General Appearance: Casual  Eye Contact:  Poor  Speech:  Clear and Coherent  Volume:  Normal  Mood:  Anxious, Depressed, and Dysphoric  Affect:  Depressed  Thought Process:  Goal Directed and Descriptions of Associations: Intact  Orientation:  Full (Time, Place, and Person)  Thought Content:  Logical  Suicidal Thoughts:  No  Homicidal Thoughts:  No  Memory:  Immediate;    Fair Recent;   Fair Remote;   Fair  Judgement:  Fair  Insight:  Shallow  Psychomotor Activity:  Restlessness  Concentration:  Concentration: Fair and Attention Span: Fair  Recall:  [place]: Fair  Akathisia:  No  Handed:  Right  AIMS (if indicated):  not done  Assets:  Communication Skills Desire for Improvement Housing Intimacy Social Support  ADL's:  Intact  Cognition: WNL  Sleep:  Fair   Screenings: GAD-7    Flowsheet Row Office Visit from 03/26/2022 in [place] Office Visit from 02/19/2022 in [place] Visit from 01/21/2022 in [place] Visit from 10/10/2021 in [facility] Video Visit from 09/11/2021 in [facility]  Total GAD-7 Score '4 4 16 7 4      '$ PHQ2-9    [place] Visit from 03/26/2022 in [place] Office Visit from 02/19/2022 in [place] Visit from 01/21/2022 in [place] Visit from 10/10/2021 in [facility] Video Visit from 09/11/2021 in [place]  PHQ-2 Total Score '6 5 4 '$ 0 1  PHQ-9 Total Score '16 16 20 1 9      '$ [place] Office Visit from 03/26/2022 in [place] Admission (Discharged) from 06/04/2021 in [place] Admission (Discharged) from 09/11/2020 in [place] No Risk No Risk No Risk       Assessment and Plan: Jacob [name] is a 31 year old Caucasian male, lives with his significant other, in [place], employed, has a history of depression, anxiety, GERD, obesity, tachycardia, was evaluated in office today.  Patient with significant use of cannabis, daily use, currently presents with symptoms of depression which is not responding to antidepressant trials, will benefit from staying away from cannabis and  possible trial of medications like mood stabilizers, atypical  antipsychotics if needed.  Patient also will benefit from psychotherapy sessions.  Discussed plan as noted below. The patient demonstrates the following risk factors for suicide: Chronic risk factors for suicide include: psychiatric disorder of depression and substance use disorder. Acute risk factors for suicide include: loss (financial, interpersonal, professional). Protective factors for this patient include: positive social support and hope for the future. Considering these factors, the overall suicide risk at this point appears to be low. Patient is appropriate for outpatient follow up.   Plan  Depression unspecified-rule out substance-induced depression versus MDD-unstable Patient advised to stop using cannabis, provided education. Continue Prozac, could increase to 80 mg if he is interested. We will consider adding an atypical antipsychotic like Abilify or Rexulti-however patient will need to stay away from cannabis and also will need an EKG due to his history of tachycardia.  Currently on metoprolol. Referral for CBT-provided resources in the community.  Social anxiety disorder-unstable Referral for CBT.  Long-term use of cannabis-rule out cannabis use disorder-unstable Provided education.  Referral for CBT Provided resources in the community.  High risk medication use-since patient is on medications like lorazepam given his history of substance use as well as history of alcoholism-will benefit from getting a urine drug screen-ordered in the system.  At risk for prolonged QT syndrome-we will order EKG.  Patient provided [phone] to get this done.  I have reviewed labs-TSH-01/21/2022-within normal limits.  I have reviewed notes per Dr. Sima Matas ,neuropsychological testing-dated 10/08/2021, 10/10/2021, 01/21/2022-patient with diagnosis of MDD, GAD-does not meet criteria for ADHD. Discussed with patient he could be  referred for other testing if he is interested.  Patient declines.  I have reviewed notes per Dr. Jinny Blossom Johnson-02/19/2022-patient with him, anxiety-fluoxetine 60 mg p.o. daily was prescribed.  Follow-up in clinic in 4 to 6 weeks or sooner if needed.  This note was generated in part or whole with voice recognition software. Voice recognition is usually quite accurate but there are transcription errors that can and very often do occur. I apologize for any typographical errors that were not detected and corrected.      Ursula Alert, MD 11/2/202312:56 PM

## 2022-04-04 LAB — PANEL 799049

## 2022-04-04 LAB — URINE DRUGS OF ABUSE SCREEN W ALC, ROUTINE (REF LAB)
Amphetamines, Urine: NEGATIVE ng/mL
Barbiturate, Ur: NEGATIVE ng/mL
Benzodiazepine Quant, Ur: NEGATIVE ng/mL
Cocaine (Metab.): NEGATIVE ng/mL
Ethanol U, Quan: NEGATIVE %
Methadone Screen, Urine: NEGATIVE ng/mL
Opiate Quant, Ur: NEGATIVE ng/mL
Phencyclidine, Ur: NEGATIVE ng/mL
Propoxyphene, Urine: NEGATIVE ng/mL

## 2022-04-07 ENCOUNTER — Telehealth: Payer: Self-pay | Admitting: Psychiatry

## 2022-04-07 NOTE — Telephone Encounter (Signed)
[place]. at [phone] to discuss why cannabinoid came back as involved result.  Staff picked up the phone and said they will route this message to someone who can answer that question since they were unable to answer.  Return call requested-provided number for office at [phone] for them to call me back.

## 2022-04-08 NOTE — Telephone Encounter (Signed)
Returned call to discuss patient's invalid lab report-UDS-cannabinoids.  Per Dr. Lanny Hurst at [facility]sample was positive for cannabinoids however when they did the confirmation test, he did not meet all the criteria for a valid test and hence they could not report it out.  Hence the test report was documented as invalid.  That does not mean patient is negative for cannabinoids.  Recommendations- redoing the urine drug screen.

## 2022-04-13 ENCOUNTER — Encounter: Payer: Self-pay | Admitting: Family Medicine

## 2022-04-13 ENCOUNTER — Ambulatory Visit: Payer: BC Managed Care – PPO | Admitting: Family Medicine

## 2022-04-13 VITALS — BP 126/78 | HR 92 | Temp 98.8°F | Ht 72.0 in | Wt 245.8 lb

## 2022-04-13 DIAGNOSIS — R002 Palpitations: Secondary | ICD-10-CM | POA: Diagnosis not present

## 2022-04-13 DIAGNOSIS — F331 Major depressive disorder, recurrent, moderate: Secondary | ICD-10-CM | POA: Diagnosis not present

## 2022-04-13 DIAGNOSIS — Z87442 Personal history of urinary calculi: Secondary | ICD-10-CM

## 2022-04-13 DIAGNOSIS — K219 Gastro-esophageal reflux disease without esophagitis: Secondary | ICD-10-CM

## 2022-04-13 MED ORDER — OMEPRAZOLE 20 MG PO CPDR: 20.0000 mg | DELAYED_RELEASE_CAPSULE | Freq: Every day | ORAL | 1 refills | Status: DC

## 2022-04-13 MED ORDER — TAMSULOSIN HCL 0.4 MG PO CAPS: 0.4000 mg | ORAL_CAPSULE | Freq: Every day | ORAL | 1 refills | Status: DC

## 2022-04-13 MED ORDER — METOPROLOL SUCCINATE ER 25 MG PO TB24: 12.5000 mg | ORAL_TABLET | Freq: Every day | ORAL | 1 refills | Status: DC

## 2022-04-13 NOTE — Assessment & Plan Note (Signed)
Stable. Continue flomax. Call with any concerns.

## 2022-04-13 NOTE — Assessment & Plan Note (Signed)
Under good control on current regimen. Continue current regimen. Continue to monitor. Call with any concerns. Refills given.   

## 2022-04-13 NOTE — Progress Notes (Signed)
BP 126/78 (BP Location: Left Arm, Cuff Size: Normal)   Pulse 92   Temp 98.8 F (37.1 C) (Oral)   Ht 6' (1.829 m)   Wt 245 lb 12.8 oz (111.5 kg)   SpO2 98%   BMI 33.34 kg/m    Subjective:    Patient ID: Jacob [name], male    DOB: 07-09-1990, 31 y.o.   MRN: [id number]  HPI: Eron Staat is a 31 y.o. male  Chief Complaint  Patient presents with   Depression   DEPRESSION Mood status: better Satisfied with current treatment?:  unsure Symptom severity: moderate  Duration of current treatment : chronic Side effects: no Medication compliance: excellent compliance Psychotherapy/counseling: no  Depressed mood: yes Anxious mood: yes Anhedonia: no Significant weight loss or gain: no Insomnia: no  Fatigue: yes Feelings of worthlessness or guilt: no Impaired concentration/indecisiveness: no Suicidal ideations: no Hopelessness: no Crying spells: no    04/13/2022    9:09 AM 03/26/2022   10:58 AM 02/19/2022    8:56 AM 01/21/2022    8:52 AM 10/10/2021    8:13 AM  Depression screen PHQ 2/9  Decreased Interest '1 3 2 2 '$ 0  Down, Depressed, Hopeless '1 3 3 2 '$ 0  PHQ - 2 Score '2 6 5 4 '$ 0  Altered sleeping '3 1 1 3 1  '$ Tired, decreased energy '2 3 3 3 '$ 0  Change in appetite '1 3 3 2 '$ 0  Feeling bad or failure about yourself  0 '2 2 2 '$ 0  Trouble concentrating 0 '1 2 3 '$ 0  Moving slowly or fidgety/restless 0 0 0 3 0  Suicidal thoughts 0 0 0 0 0  PHQ-9 Score '8 16 16 20 1  '$ Difficult doing work/chores Somewhat difficult Very difficult Very difficult Very difficult    PALPITATIONS Duration:  chronic Symptom description: heart racing Duration of episode: minutes Frequency:  recurrent Activity when event occurred: at random Related to exertion: no Dyspnea: no Chest pain: no Syncope: no Anxiety/stress: yes Nausea/vomiting: no Diaphoresis: no Coronary artery disease: no Congestive heart failure: no Arrhythmia:no Thyroid disease: no Caffeine intake: Heavy caffeine consumption Status:   better Treatments attempted:none  GERD GERD control status: controlled Satisfied with current treatment? yes Heartburn frequency: rarely Medication side effects: no  Medication compliance: excellent Dysphagia: no Odynophagia:  no Hematemesis: no Blood in stool: no EGD: yes  Relevant past medical, surgical, family and social history reviewed and updated as indicated. Interim medical history since our last visit reviewed. Allergies and medications reviewed and updated.  Review of Systems  Constitutional: Negative.   Respiratory: Negative.    Cardiovascular: Negative.   Gastrointestinal: Negative.   Musculoskeletal: Negative.   Psychiatric/Behavioral: Negative.      Per HPI unless specifically indicated above     Objective:    BP 126/78 (BP Location: Left Arm, Cuff Size: Normal)   Pulse 92   Temp 98.8 F (37.1 C) (Oral)   Ht 6' (1.829 m)   Wt 245 lb 12.8 oz (111.5 kg)   SpO2 98%   BMI 33.34 kg/m   Wt Readings from Last 3 Encounters:  04/13/22 245 lb 12.8 oz (111.5 kg)  03/26/22 248 lb 12.8 oz (112.9 kg)  02/19/22 243 lb 8 oz (110.5 kg)    Physical Exam Vitals and nursing note reviewed.  Constitutional:      General: He is not in acute distress.    Appearance: Normal appearance. He is not ill-appearing, toxic-appearing or diaphoretic.  HENT:     Head:  Normocephalic and atraumatic.     Right Ear: External ear normal.     Left Ear: External ear normal.     Nose: Nose normal.     Mouth/Throat:     Mouth: Mucous membranes are moist.     Pharynx: Oropharynx is clear.  Eyes:     General: No scleral icterus.       Right eye: No discharge.        Left eye: No discharge.     Extraocular Movements: Extraocular movements intact.     Conjunctiva/sclera: Conjunctivae normal.     Pupils: Pupils are equal, round, and reactive to light.  Cardiovascular:     Rate and Rhythm: Normal rate and regular rhythm.     Pulses: Normal pulses.     Heart sounds: Normal heart  sounds. No murmur heard.    No friction rub. No gallop.  Pulmonary:     Effort: Pulmonary effort is normal. No respiratory distress.     Breath sounds: Normal breath sounds. No stridor. No wheezing, rhonchi or rales.  Chest:     Chest wall: No tenderness.  Musculoskeletal:        General: Normal range of motion.     Cervical back: Normal range of motion and neck supple.  Skin:    General: Skin is warm and dry.     Capillary Refill: Capillary refill takes less than 2 seconds.     Coloration: Skin is not jaundiced or pale.     Findings: No bruising, erythema, lesion or rash.  Neurological:     General: No focal deficit present.     Mental Status: He is alert and oriented to person, place, and time. Mental status is at baseline.  Psychiatric:        Mood and Affect: Mood normal.        Behavior: Behavior normal.        Thought Content: Thought content normal.        Judgment: Judgment normal.     Results for orders placed or performed during the hospital encounter of 03/26/22  Urine drugs of abuse scrn w alc, routine (Ref Lab)  Result Value Ref Range   Amphetamines, Urine Negative Cutoff=1000 ng/mL   Barbiturate, Ur Negative Cutoff=300 ng/mL   Benzodiazepine Quant, Ur Negative Cutoff=300 ng/mL   Cannabinoid Quant, Ur See Final Results Cutoff=50 ng/mL   Cocaine (Metab.) Negative Cutoff=300 ng/mL   Opiate Quant, Ur Negative Cutoff=300 ng/mL   Phencyclidine, Ur Negative Cutoff=25 ng/mL   Methadone Screen, Urine Negative Cutoff=300 ng/mL   Propoxyphene, Urine Negative Cutoff=300 ng/mL   Ethanol U, Quan Negative Cutoff=0.020 %  Panel [id number]  Result Value Ref Range   Cannabinoid GC/MS, Ur Comment: Cutoff=50      Assessment & Plan:   Problem List Items Addressed This Visit       Digestive   Chronic GERD    Under good control on current regimen. Continue current regimen. Continue to monitor. Call with any concerns. Refills given.        Relevant Medications   omeprazole  (PRILOSEC) 20 MG capsule     Other   Depression    Continue to follow with psychiatry. Doing better. Continue to monitor. Call with any concerns.       Palpitations - Primary    Under good control on current regimen. Continue current regimen. Continue to monitor. Call with any concerns. Refills given.        History of kidney stones  Stable. Continue flomax. Call with any concerns.         Follow up plan: Return in about 6 months (around 10/12/2022) for physical.

## 2022-04-13 NOTE — Assessment & Plan Note (Signed)
Continue to follow with psychiatry. Doing better. Continue to monitor. Call with any concerns.

## 2022-04-20 ENCOUNTER — Ambulatory Visit: Payer: BC Managed Care – PPO | Admitting: Family Medicine

## 2022-05-08 ENCOUNTER — Other Ambulatory Visit: Payer: Self-pay | Admitting: Family Medicine

## 2022-05-08 NOTE — Telephone Encounter (Signed)
Last RF 02/19/22 #90 1 RF  Requested Prescriptions  Refused Prescriptions Disp Refills   FLUoxetine (PROZAC) 40 MG capsule [Pharmacy Med Name: FLUOXETINE HCL 40 MG CAPSULE] 90 capsule 1    Sig: TAKE 1 CAPSULE (40 MG TOTAL) BY MOUTH DAILY.     Psychiatry:  Antidepressants - SSRI Passed - 05/08/2022 12:34 AM      Passed - Completed PHQ-2 or PHQ-9 in the last 360 days      Passed - Valid encounter within last 6 months    Recent Outpatient Visits           3 weeks ago Palpitations   [place], Megan P, DO   2 months ago Need for influenza vaccination   [place], Megan P, DO   3 months ago Moderate episode of recurrent major depressive disorder [facility])   [place], Megan P, DO   7 months ago Moderate episode of recurrent major depressive disorder ([place])   [place], Megan P, DO   7 months ago Moderate episode of recurrent major depressive disorder [facility])   [place], Megan P, DO       Future Appointments             In 5 months Johnson, Barb Merino, DO [employer]

## 2022-05-11 ENCOUNTER — Ambulatory Visit: Payer: BC Managed Care – PPO | Admitting: Psychiatry

## 2022-05-11 ENCOUNTER — Encounter: Payer: Self-pay | Admitting: Psychiatry

## 2022-05-11 VITALS — BP 138/95 | HR 91 | Temp 98.5°F | Ht 72.0 in | Wt 248.0 lb

## 2022-05-11 DIAGNOSIS — F4010 Social phobia, unspecified: Secondary | ICD-10-CM | POA: Diagnosis not present

## 2022-05-11 DIAGNOSIS — F1290 Cannabis use, unspecified, uncomplicated: Secondary | ICD-10-CM

## 2022-05-11 DIAGNOSIS — Z79899 Other long term (current) drug therapy: Secondary | ICD-10-CM | POA: Diagnosis not present

## 2022-05-11 DIAGNOSIS — F1011 Alcohol abuse, in remission: Secondary | ICD-10-CM

## 2022-05-11 DIAGNOSIS — Z9189 Other specified personal risk factors, not elsewhere classified: Secondary | ICD-10-CM

## 2022-05-11 DIAGNOSIS — F32A Depression, unspecified: Secondary | ICD-10-CM

## 2022-05-11 MED ORDER — DOXEPIN HCL 10 MG PO CAPS: 10.0000 mg | ORAL_CAPSULE | Freq: Every day | ORAL | 1 refills | Status: DC

## 2022-05-11 MED ORDER — FLUOXETINE HCL 40 MG PO CAPS: 40.0000 mg | ORAL_CAPSULE | Freq: Every day | ORAL | 0 refills | Status: DC

## 2022-05-11 NOTE — Progress Notes (Unsigned)
[facility] MD OP Progress Note  05/11/2022 4:02 PM Jacob [name]  MRN:  [id number]  Chief Complaint:  Chief Complaint  Patient presents with   Follow-up   Anxiety   Medication Refill   Depression   Insomnia   HPI: Jacob [name] is a 31 year old Caucasian male, employed, lives with his significant other in [place], has a history of depression unspecified, social anxiety disorder, long-term current use of cannabis, tachycardia, chronic GERD, history of colonic polyps, obesity was evaluated in office today.  Patient today reports he continues to have ups and downs in his mood.  Patient reports there are days when he feels happy and feels like he can accomplish a lot and other days when he is down and is unable to do anything.  Patient continues to have struggles with lack of motivation, procrastination.  Patient reports he however has been pushing himself to function okay at work.  Has not had any trouble with work.  Patient however reports he puts off everything to the last minute often.  Patient also reports impulsivity like he needs to click on certain tabs on his computer at work, he does not even know why he does that.  Patient reports nailbiting on a regular basis.  Does not believe he is anxious however does have a history of anxiety mostly social.  Patient reports sleep is restless.  He has difficulty falling asleep as well as sleep is interrupted.  Currently not on sleep medication.  He stopped using cannabis and since then sleep has been restless.  He has not used cannabis in the past 1 month or so.  Patient reports appetite is reduced since stopping the cannabis however he has been eating enough for himself.  He does not have the'  cannabis munchies.'  Denies any suicidality, homicidality or perceptual disturbances.  Patient has been noncompliant with therapy.  Agreeable to find a therapist.  Patient also has been noncompliant with EKG, agrees to get it done.  Denies any other concerns  today.  Visit Diagnosis:    ICD-10-CM   1. Depression, unspecified depression type  F32.A FLUoxetine (PROZAC) 40 MG capsule    doxepin (SINEQUAN) 10 MG capsule    2. Social anxiety disorder  F40.10 FLUoxetine (PROZAC) 40 MG capsule    3. Long term current use of cannabis  F12.90     4. High risk medication use  Z79.899     5. At risk for prolonged QT interval syndrome  Z91.89     6. History of alcohol abuse  F10.11       Past Psychiatric History: Reviewed past psychiatric history from progress note on 03/26/2022. Neuropsychological testing completed by Dr. Rodenbough-10/08/2021-patient did not meet criteria for ADHD.  Diagnosed with MDD, generalized anxiety disorder.  Past Medical History:  Past Medical History:  Diagnosis Date   Anxiety    Depression     Past Surgical History:  Procedure Laterality Date   COLONOSCOPY WITH PROPOFOL N/A 06/04/2021   Procedure: COLONOSCOPY WITH PROPOFOL;  Surgeon: Lin Landsman, MD;  Location: [facility] ENDOSCOPY;  Service: Gastroenterology;  Laterality: N/A;   ESOPHAGOGASTRODUODENOSCOPY (EGD) WITH PROPOFOL N/A 09/11/2020   Procedure: ESOPHAGOGASTRODUODENOSCOPY (EGD) WITH PROPOFOL;  Surgeon: Lin Landsman, MD;  Location: [facility] ENDOSCOPY;  Service: Gastroenterology;  Laterality: N/A;   FLEXIBLE SIGMOIDOSCOPY N/A 09/11/2020   Procedure: FLEXIBLE SIGMOIDOSCOPY;  Surgeon: Lin Landsman, MD;  Location: [facility] ENDOSCOPY;  Service: Gastroenterology;  Laterality: N/A;    Family Psychiatric History: Reviewed family psychiatric history from  progress note on 03/26/2022.  Family History:  Family History  Problem Relation Age of Onset   ADD / ADHD Mother    Diabetes Mother    Early death Father    Autoimmune disease Father    Autoimmune disease Sister    Early death Brother    Autoimmune disease Brother    ADD / ADHD Maternal Grandmother    Autism spectrum disorder Half-Brother     Social History: Reviewed social history from progress note  on 03/26/2022. Social History   Socioeconomic History   Marital status: Soil scientist    Spouse name: Not on file   Number of children: Not on file   Years of education: Not on file   Highest education level: Some college, no degree  Occupational History   Not on file  Tobacco Use   Smoking status: Never   Smokeless tobacco: Never  Vaping Use   Vaping Use: Some days   Substances: THC  Substance and Sexual Activity   Alcohol use: Yes    Alcohol/week: 1.0 standard drink of alcohol    Types: 1 Glasses of wine per week    Comment: Socially   Drug use: Yes    Types: Marijuana    Comment: daily   Sexual activity: Yes    Birth control/protection: None  Other Topics Concern   Not on file  Social History Narrative   Not on file   Social Determinants of Health   Financial Resource Strain: Not on file  Food Insecurity: Not on file  Transportation Needs: Not on file  Physical Activity: Not on file  Stress: Not on file  Social Connections: Not on file    Allergies:  Allergies  Allergen Reactions   Lexapro [Escitalopram]     Made patient dizzy   Wellbutrin [Bupropion] Other (See Comments)    Brain fog    Metabolic Disorder Labs: Lab Results  Component Value Date   HGBA1C 4.9 01/26/2018   No results found for: "PROLACTIN" Lab Results  Component Value Date   CHOL 184 06/26/2021   TRIG 165 (H) 06/26/2021   HDL 40 06/26/2021   LDLCALC 115 (H) 06/26/2021   LDLCALC 110 (H) 08/22/2020   Lab Results  Component Value Date   TSH 2.060 01/21/2022   TSH 1.280 05/02/2021    Therapeutic Level Labs: No results found for: "LITHIUM" No results found for: "VALPROATE" No results found for: "CBMZ"  Current Medications: Current Outpatient Medications  Medication Sig Dispense Refill   doxepin (SINEQUAN) 10 MG capsule Take 1 capsule (10 mg total) by mouth at bedtime. 30 capsule 1   LORazepam (ATIVAN) 0.5 MG tablet Take 1 tablet (0.5 mg total) by mouth 2 (two) times daily  as needed for anxiety. 20 tablet 0   metoprolol succinate (TOPROL-XL) 25 MG 24 hr tablet Take 0.5 tablets (12.5 mg total) by mouth daily. 45 tablet 1   omeprazole (PRILOSEC) 20 MG capsule Take 1 capsule (20 mg total) by mouth daily. 90 capsule 1   tamsulosin (FLOMAX) 0.4 MG CAPS capsule Take 1 capsule (0.4 mg total) by mouth daily. 90 capsule 1   FLUoxetine (PROZAC) 40 MG capsule Take 1 capsule (40 mg total) by mouth daily. 90 capsule 0   No current facility-administered medications for this visit.     Musculoskeletal: Strength & Muscle Tone: within normal limits Gait & Station: normal Patient leans: N/A  Psychiatric Specialty Exam: Review of Systems  Psychiatric/Behavioral:  Positive for decreased concentration, dysphoric mood and sleep  disturbance. The patient is nervous/anxious.   All other systems reviewed and are negative.   Blood pressure (!) 138/95, pulse 91, temperature 98.5 F (36.9 C), temperature source Oral, height 6' (1.829 m), weight 248 lb (112.5 kg).Body mass index is 33.63 kg/m.  General Appearance: Casual  Eye Contact:  Fair  Speech:  Clear and Coherent  Volume:  Normal  Mood:  Anxious and Depressed  Affect:  Congruent  Thought Process:  Goal Directed and Descriptions of Associations: Intact  Orientation:  Full (Time, Place, and Person)  Thought Content: Logical   Suicidal Thoughts:  No  Homicidal Thoughts:  No  Memory:  Immediate;   Fair Recent;   Fair Remote;   Fair  Judgement:  Fair  Insight:  Fair  Psychomotor Activity:  Normal  Concentration:  Concentration: Fair and Attention Span: Fair  Recall:  [employer] of Knowledge: Fair  Language: Fair  Akathisia:  No  Handed:  Right  AIMS (if indicated): not done  Assets:  Communication Skills Desire for [place] Talents/Skills Transportation  ADL's:  Intact  Cognition: WNL  Sleep:  Poor   Screenings: GAD-7    Flowsheet Row Office Visit from 05/11/2022 in  [place] Office Visit from 04/13/2022 in [place] Visit from 03/26/2022 in [place] Office Visit from 02/19/2022 in [place] Visit from 01/21/2022 in [place]  Total GAD-7 Score '8 10 4 4 16      '$ PHQ2-9    [place] Office Visit from 05/11/2022 in [place] Office Visit from 04/13/2022 in [place] Visit from 03/26/2022 in [place] Office Visit from 02/19/2022 in [place] Visit from 01/21/2022 in [place]  PHQ-2 Total Score '2 2 6 5 4  '$ PHQ-9 Total Score '10 8 16 16 20      '$ [place] Office Visit from 05/11/2022 in [place] Office Visit from 03/26/2022 in [place] Admission (Discharged) from 06/04/2021 in [place] No Risk No Risk No Risk        Assessment and Plan: Jacob [name] is a 31 year old Caucasian male, lives with his significant other in [place], employed, has a history of depression, anxiety, GERD, obesity, tachycardia was evaluated in office today.  Patient continues to struggle with mood symptoms, sleep problems, currently off of the cannabis since the past 1 month.  Patient will benefit from the following plan.  Plan Depression unspecified-rule out substance-induced depression versus MDD-improving Reduce Prozac to 40 mg p.o. daily due to side effects Add doxepin 10 mg p.o. nightly for sleep Referral for CBT-communicated with staff to schedule this patient with our therapist  Social anxiety disorder-unstable Continue Prozac as prescribed Referral for CBT manage  Long-term use of cannabis-improving Patient is currently staying away from cannabis Referral for CBT  High risk medication use Reviewed urine drug  screen-dated 03/26/2022-Per [facility] provider patient positive for cannabinoids however confirmation test was invalid and recommended retesting.  At risk for prolonged QT syndrome-patient to have EKG completed-advised to call [phone].  EKG ordered in the system.  Follow-up in clinic in 4 to 6 weeks or sooner if needed.   This note was generated in part or whole with voice recognition software. Voice recognition is usually quite accurate but there are transcription errors that can and very often do occur. I apologize for  any typographical errors that were not detected and corrected.    Ursula Alert, MD 05/12/2022, 10:16 AM

## 2022-05-11 NOTE — Patient Instructions (Signed)
Please call for EKG - [phone]  Doxepin Capsules What is this medication? DOXEPIN (DOX e pin) treats depression and anxiety. It increases the amount of serotonin and norepinephrine in the brain, hormones that help regulate mood. It belongs to a group of medications called tricyclic antidepressants (TCAs). This medicine may be used for other purposes; ask your health care provider or pharmacist if you have questions. COMMON BRAND NAME(S): Sinequan What should I tell my care team before I take this medication? They need to know if you have any of these conditions: Bipolar disorder Difficulty passing urine Frequently drink alcohol Glaucoma Heart disease Liver disease Lung or breathing disease, such as asthma or sleep apnea Prostate trouble Schizophrenia Seizures Suicidal thoughts, plans, or attempt by you or a family member An unusual or allergic reaction to doxepin, other medications, foods, dyes, or preservatives Pregnant or trying to get pregnant Breastfeeding How should I use this medication? Take this medication by mouth with a glass of water. Follow the directions on the prescription label. Take your doses at regular intervals. Do not take your medication more often than directed. Do not stop taking this medication suddenly except upon the advice of your care team. Stopping this medication too quickly may cause serious side effects or your condition may worsen. A special MedGuide will be given to you by the pharmacist with each prescription and refill. Be sure to read this information carefully each time. Talk to your care team about the use of this medication in children. While this medication may be prescribed for children as young as 12 years for selected conditions, precautions do apply. Overdosage: If you think you have taken too much of this medicine contact a poison control center or emergency room at once. NOTE: This medicine is only for you. Do not share this medicine  with others. What if I miss a dose? If you miss a dose, take it as soon as you can. If it is almost time for your next dose, take only that dose. Do not take double or extra doses. What may interact with this medication? Do not take this medication with any of the following: Arsenic trioxide Certain medications for irregular heartbeat or other heart conditions Cisapride Halofantrine Levomethadyl Linezolid MAOIs, such as Carbex, Eldepryl, Marplan, Nardil, and Parnate Methylene blue Other medications for depression Phenothiazines, such as perphenazine, thioridazine, and chlorpromazine Pimozide Procarbazine Sparfloxacin St. John's Wort This medication may also interact with the following: Cimetidine Tolazamide Ziprasidone This list may not describe all possible interactions. Give your health care provider a list of all the medicines, herbs, non-prescription drugs, or dietary supplements you use. Also tell them if you smoke, drink alcohol, or use illegal drugs. Some items may interact with your medicine. What should I watch for while using this medication? Visit your care team for regular checks on your progress. It can take several days before you feel the full effect of this medication. If you have been taking this medication regularly for some time, do not suddenly stop taking it. You must gradually reduce the dose, or you may get severe side effects. Ask your care team for advice. Even after you stop taking this medication it can still affect your body for several days. Patients and their families should watch out for new or worsening thoughts of suicide or depression. Also watch out for sudden changes in feelings such as feeling anxious, agitated, panicky, irritable, hostile, aggressive, impulsive, severely restless, overly excited and hyperactive, or not being able to sleep.  If this happens, especially at the beginning of treatment or after a change in dose, call your care team. This  medication may affect your coordination, reaction time, or judgment. Do not drive or operate machinery until you know how this medication affects you. Sit up or stand slowly to reduce the risk of dizzy or fainting spells. Drinking alcohol with this medication can increase the risk of these side effects. Do not treat yourself for coughs, colds, or allergies without asking your care team for advice. Some ingredients can increase possible side effects. Your mouth may get dry. Chewing sugarless gum or sucking hard candy and drinking plenty of water may help. Contact your care team if the problem does not go away or is severe. This medication may cause dry eyes and blurred vision. If you wear contact lenses you, may feel some discomfort. Lubricating eye drops may help. See your care team if the problem does not go away or is severe. This medication can make you more sensitive to the sun. Keep out of the sun. If you cannot avoid being in the sun, wear protective clothing and sunscreen. Do not use sun lamps, tanning beds, or tanning booths. What side effects may I notice from receiving this medication? Side effects that you should report to your care team as soon as possible: Allergic reactions--skin rash, itching, hives, swelling of the face, lips, tongue, or throat Irritability, confusion, fast or irregular heartbeat, muscle stiffness, twitching muscles, sweating, high fever, seizure, chills, vomiting, diarrhea, which may be signs of serotonin syndrome Sudden eye pain or change in vision such as blurry vision, seeing halos around lights, vision loss Thoughts of suicide or self-harm, worsening mood, or feelings of depression Trouble passing urine Side effects that usually do not require medical attention (report to your care team if they continue or are bothersome): Change in sex drive or performance Constipation Dizziness Drowsiness Dry mouth Tremors Weight gain This list may not describe all possible  side effects. Call your doctor for medical advice about side effects. You may report side effects to FDA at 1-800-FDA-1088. Where should I keep my medication? Keep out of the reach of children. Store at room temperature between 15 and 30 degrees C (59 and 86 degrees F). Throw away any unused medication after the expiration date. NOTE: This sheet is a summary. It may not cover all possible information. If you have questions about this medicine, talk to your doctor, pharmacist, or health care provider.  2023 Elsevier/Gold Standard (2020-08-15 00:00:00)

## 2022-05-21 ENCOUNTER — Other Ambulatory Visit: Payer: Self-pay | Admitting: Family Medicine

## 2022-05-22 NOTE — Telephone Encounter (Signed)
Unable to refill per protocol, Rx request is too soon and last refill by another provider. Last refill 05/11/22 for 90 days. Will refuse.  Requested Prescriptions  Pending Prescriptions Disp Refills   FLUoxetine (PROZAC) 20 MG capsule [Pharmacy Med Name: FLUOXETINE HCL 20 MG CAPSULE] 90 capsule 1    Sig: TAKE 1 CAPSULE BY MOUTH EVERY DAY     Psychiatry:  Antidepressants - SSRI Passed - 05/21/2022  1:50 AM      Passed - Completed PHQ-2 or PHQ-9 in the last 360 days      Passed - Valid encounter within last 6 months    Recent Outpatient Visits           1 month ago Palpitations   [place], Megan P, DO   3 months ago Need for influenza vaccination   [place], Megan P, DO   4 months ago Moderate episode of recurrent major depressive disorder ([place])   [place], Megan P, DO   7 months ago Moderate episode of recurrent major depressive disorder ([place])   [place], Megan P, DO   8 months ago Moderate episode of recurrent major depressive disorder [facility])   [place], Megan P, DO       Future Appointments             In 4 months Johnson, Barb Merino, DO [employer]

## 2022-06-09 ENCOUNTER — Telehealth: Payer: Self-pay | Admitting: Psychiatry

## 2022-06-09 DIAGNOSIS — F4010 Social phobia, unspecified: Secondary | ICD-10-CM

## 2022-06-09 DIAGNOSIS — F32A Depression, unspecified: Secondary | ICD-10-CM

## 2022-06-09 MED ORDER — FLUOXETINE HCL 40 MG PO CAPS: 40.0000 mg | ORAL_CAPSULE | Freq: Every day | ORAL | 0 refills | Status: DC

## 2022-06-09 NOTE — Telephone Encounter (Signed)
I have sent fluoxetine 40 mg to [facility] [place] as requested by patient since mail delivery pharmacy-out of stock for fluoxetine.

## 2022-06-09 NOTE — Telephone Encounter (Signed)
pt notified that rx was a pharmacy

## 2022-06-17 ENCOUNTER — Ambulatory Visit
Admission: RE | Admit: 2022-06-17 | Discharge: 2022-06-17 | Disposition: A | Discharge status: Home / Self Care | Payer: BC Managed Care – PPO | Source: Ambulatory Visit | Attending: Family Medicine | Admitting: Family Medicine

## 2022-06-17 DIAGNOSIS — F1290 Cannabis use, unspecified, uncomplicated: Secondary | ICD-10-CM | POA: Insufficient documentation

## 2022-06-17 DIAGNOSIS — Z01810 Encounter for preprocedural cardiovascular examination: Secondary | ICD-10-CM | POA: Diagnosis not present

## 2022-06-17 DIAGNOSIS — Z79899 Other long term (current) drug therapy: Secondary | ICD-10-CM | POA: Diagnosis not present

## 2022-06-17 DIAGNOSIS — Z9189 Other specified personal risk factors, not elsewhere classified: Secondary | ICD-10-CM | POA: Diagnosis not present

## 2022-06-17 DIAGNOSIS — F4010 Social phobia, unspecified: Secondary | ICD-10-CM | POA: Diagnosis not present

## 2022-06-17 DIAGNOSIS — F32A Depression, unspecified: Secondary | ICD-10-CM | POA: Insufficient documentation

## 2022-06-26 ENCOUNTER — Telehealth: Payer: Self-pay

## 2022-06-26 ENCOUNTER — Ambulatory Visit: Payer: BC Managed Care – PPO | Admitting: Psychiatry

## 2022-06-26 ENCOUNTER — Encounter: Payer: Self-pay | Admitting: Psychiatry

## 2022-06-26 VITALS — BP 149/85 | HR 77 | Temp 98.3°F | Ht 72.0 in | Wt 258.4 lb

## 2022-06-26 DIAGNOSIS — F1011 Alcohol abuse, in remission: Secondary | ICD-10-CM

## 2022-06-26 DIAGNOSIS — F4010 Social phobia, unspecified: Secondary | ICD-10-CM

## 2022-06-26 DIAGNOSIS — Z79899 Other long term (current) drug therapy: Secondary | ICD-10-CM

## 2022-06-26 DIAGNOSIS — F331 Major depressive disorder, recurrent, moderate: Secondary | ICD-10-CM

## 2022-06-26 DIAGNOSIS — F1290 Cannabis use, unspecified, uncomplicated: Secondary | ICD-10-CM

## 2022-06-26 MED ORDER — FLUOXETINE HCL 10 MG PO CAPS: 10.0000 mg | ORAL_CAPSULE | Freq: Every day | ORAL | 0 refills | Status: DC

## 2022-06-26 MED ORDER — PAROXETINE HCL 20 MG PO TABS: 10.0000 mg | ORAL_TABLET | ORAL | 1 refills | Status: DC

## 2022-06-26 NOTE — Patient Instructions (Signed)
Paroxetine Tablets What is this medication? PAROXETINE (pa ROX e teen) treats depression, anxiety, obsessive-compulsive disorder (OCD), post-traumatic stress disorder (PTSD), and premenstrual dysphoric disorder (PMDD). It increases the amount of serotonin in the brain, a hormone that helps regulate mood. It belongs to a group of medications called SSRIs. This medicine may be used for other purposes; ask your health care provider or pharmacist if you have questions. COMMON BRAND NAME(S): Paxil, Pexeva What should I tell my care team before I take this medication? They need to know if you have any of these conditions: Bipolar disorder or a family history of bipolar disorder Bleeding disorders Glaucoma Heart disease Kidney disease Liver disease Low levels of sodium in the blood Seizures Suicidal thoughts, plans, or attempt; a previous suicide attempt by you or a family member Take MAOIs like Carbex, Eldepryl, Marplan, Nardil, and Parnate Take medications that treat or prevent blood clots Thyroid disease An unusual or allergic reaction to paroxetine, other medications, foods, dyes, or preservatives Pregnant or trying to get pregnant Breast-feeding How should I use this medication? Take this medication by mouth with a glass of water. Follow the directions on the prescription label. You can take it with or without food. Take your medication at regular intervals. Do not take your medication more often than directed. Do not stop taking this medication suddenly except upon the advice of your care team. Stopping this medication too quickly may cause serious side effects or your condition may worsen. A special MedGuide will be given to you by the pharmacist with each prescription and refill. Be sure to read this information carefully each time. Talk to your care team regarding the use of this medication in children. Special care may be needed. Overdosage: If you think you have taken too much of this  medicine contact a poison control center or emergency room at once. NOTE: This medicine is only for you. Do not share this medicine with others. What if I miss a dose? If you miss a dose, take it as soon as you can. If it is almost time for your next dose, take only that dose. Do not take double or extra doses. What may interact with this medication? Do not take this medication with any of the following: Linezolid MAOIs like Carbex, Eldepryl, Marplan, Nardil, and Parnate Methylene blue (injected into a vein) Pimozide Thioridazine This medication may also interact with the following: Alcohol Amphetamines Aspirin and aspirin-like medications Atomoxetine Certain medications for depression, anxiety, or psychotic disturbances Certain medications for irregular heart beat like propafenone, flecainide, encainide, and quinidine Certain medications for migraine headache like almotriptan, eletriptan, frovatriptan, naratriptan, rizatriptan, sumatriptan, zolmitriptan Cimetidine Digoxin Diuretics Fentanyl Fosamprenavir Furazolidone Isoniazid Lithium Medications that treat or prevent blood clots like warfarin, enoxaparin, and dalteparin Medications for sleep NSAIDs, medications for pain and inflammation, like ibuprofen or naproxen Phenobarbital Phenytoin Procarbazine Rasagiline Ritonavir Supplements like St. John's wort, kava kava, valerian Tamoxifen Tramadol Tryptophan This list may not describe all possible interactions. Give your health care provider a list of all the medicines, herbs, non-prescription drugs, or dietary supplements you use. Also tell them if you smoke, drink alcohol, or use illegal drugs. Some items may interact with your medicine. What should I watch for while using this medication? Tell your care team if your symptoms do not get better or if they get worse. Visit your care team for regular checks on your progress. Because it may take several weeks to see the full  effects of this medication, it is important  to continue your treatment as prescribed by your care team. Watch for new or worsening thoughts of suicide or depression. This includes sudden changes in mood, behaviors, or thoughts. These changes can happen at any time but are more common in the beginning of treatment or after a change in dose. Call your care team right away if you experience these thoughts or worsening depression. Manic episodes may happen in patients with bipolar disorder who take this medication. Watch for changes in feelings or behaviors such as feeling anxious, nervous, agitated, panicky, irritable, hostile, aggressive, impulsive, severely restless, overly excited and hyperactive, or trouble sleeping. These changes can happen at any time but are more common in the beginning of treatment or after a change in dose. Call your care team right away if you notice any of these symptoms. You may get drowsy or dizzy. Do not drive, use machinery, or do anything that needs mental alertness until you know how this medication affects you. Do not stand or sit up quickly, especially if you are an older patient. This reduces the risk of dizzy or fainting spells. Alcohol may interfere with the effect of this medication. Avoid alcoholic drinks. Your mouth may get dry. Chewing sugarless gum or sucking hard candy, and drinking plenty of water will help. Contact your care team if the problem does not go away or is severe. What side effects may I notice from receiving this medication? Side effects that you should report to your care team as soon as possible: Allergic reactions--skin rash, itching, hives, swelling of the face, lips, tongue, or throat Bleeding--bloody or black, tar-like stools, red or dark brown urine, vomiting blood or brown material that looks like coffee grounds, small, red or purple spots on skin, unusual bleeding or bruising Heart rhythm changes--fast or irregular heartbeat, dizziness,  feeling faint or lightheaded, chest pain, trouble breathing Low sodium level--muscle weakness, fatigue, dizziness, headache, confusion Serotonin syndrome--irritability, confusion, fast or irregular heartbeat, muscle stiffness, twitching muscles, sweating, high fever, seizures, chills, vomiting, diarrhea Sudden eye pain or change in vision such as blurry vision, seeing halos around lights, vision loss Thoughts of suicide or self-harm, worsening mood, feelings of depression Side effects that usually do not require medical attention (report to your care team if they continue or are bothersome): Change in sex drive or performance Diarrhea Excessive sweating Nausea Tremors or shaking Upset stomach This list may not describe all possible side effects. Call your doctor for medical advice about side effects. You may report side effects to FDA at 1-800-FDA-1088. Where should I keep my medication? Keep out of the reach of children and pets. Store at room temperature between 15 and 30 degrees C (59 and 86 degrees F). Keep container tightly closed. Throw away any unused medication after the expiration date. NOTE: This sheet is a summary. It may not cover all possible information. If you have questions about this medicine, talk to your doctor, pharmacist, or health care provider.  2023 Elsevier/Gold Standard (2020-05-01 00:00:00)

## 2022-06-26 NOTE — Progress Notes (Signed)
[facility] OP Progress Note  06/26/2022 1:19 PM Jacob [name]  MRN:  [id number]  Chief Complaint:  Chief Complaint  Patient presents with   Follow-up   Medication Refill   Anxiety   Depression   HPI: Jacob [name] is a male, employed, lives with his significant other in [place], has a history of depression, anxiety, long-term use of cannabis, tachycardia, chronic GERD, history of colonic polyps was evaluated in office today.  Patient today reports he is currently not taking the doxepin.  Reports he was able to reduce the Prozac to 40 mg and since doing so he has been sleeping okay.  He also felt too activated on the higher dosage of Prozac that has also improved.  Although he is less anxious now he continues to struggle with lack of motivation, inability to take a shower, take care of chores at home on a regular basis.  Although it may have improved to some extent he is still struggling in certain areas.  Patient reports he is currently still on a wait list for a therapist, motivated to start therapy.  Patient denies any suicidality, homicidality or perceptual disturbances.  Has not used any cannabis since his last visit.  Would like to stay away.  Patient denies any other concerns today.  Visit Diagnosis:    ICD-10-CM   1. MDD (major depressive disorder), recurrent episode, moderate (HCC)  F33.1 PARoxetine (PAXIL) 20 MG tablet    FLUoxetine (PROZAC) 10 MG capsule    DISCONTINUED: FLUoxetine (PROZAC) 10 MG capsule    DISCONTINUED: FLUoxetine (PROZAC) 10 MG capsule    DISCONTINUED: FLUoxetine (PROZAC) 10 MG capsule    2. Social anxiety disorder  F40.10 PARoxetine (PAXIL) 20 MG tablet    FLUoxetine (PROZAC) 10 MG capsule    DISCONTINUED: FLUoxetine (PROZAC) 10 MG capsule    DISCONTINUED: FLUoxetine (PROZAC) 10 MG capsule    DISCONTINUED: FLUoxetine (PROZAC) 10 MG capsule    3. Long term current use of cannabis  F12.90     4. High risk medication use  Z79.899     5. History of alcohol  abuse  F10.11       Past Psychiatric History: Reviewed past psychiatric history from progress note on 03/26/2022.  Neuropsychological testing completed by Dr. Rodenbough-10/08/2021-patient did not meet criteria for ADHD.  Diagnosed with MDD, GAD.  Past Medical History:  Past Medical History:  Diagnosis Date   Anxiety    Depression     Past Surgical History:  Procedure Laterality Date   COLONOSCOPY WITH PROPOFOL N/A 06/04/2021   Procedure: COLONOSCOPY WITH PROPOFOL;  Surgeon: Lin Landsman, MD;  Location[facility] ENDOSCOPY;  Service: Gastroenterology;  Laterality: N/A;   ESOPHAGOGASTRODUODENOSCOPY (EGD) WITH PROPOFOL N/A 09/11/2020   Procedure: ESOPHAGOGASTRODUODENOSCOPY (EGD) WITH PROPOFOL;  Surgeon: Lin Landsman, MD;  Location: [facility] ENDOSCOPY;  Service: Gastroenterology;  Laterality: N/A;   FLEXIBLE SIGMOIDOSCOPY N/A 09/11/2020   Procedure: FLEXIBLE SIGMOIDOSCOPY;  Surgeon: Lin Landsman, MD;  Location: [facility] ENDOSCOPY;  Service: Gastroenterology;  Laterality: N/A;    Family Psychiatric History: Reviewed family psychiatric history from progress note on 03/26/2022.  Family History:  Family History  Problem Relation Age of Onset   ADD / ADHD Mother    Diabetes Mother    Early death Father    Autoimmune disease Father    Autoimmune disease Sister    Early death Brother    Autoimmune disease Brother    ADD / ADHD Maternal Grandmother    Autism spectrum disorder Half-Brother  Social History: Reviewed social history from progress note on 03/26/2022. Social History   Socioeconomic History   Marital status: Soil scientist    Spouse name: Not on file   Number of children: Not on file   Years of education: Not on file   Highest education level: Some college, no degree  Occupational History   Not on file  Tobacco Use   Smoking status: Never   Smokeless tobacco: Never  Vaping Use   Vaping Use: Some days   Substances: THC  Substance and Sexual Activity    Alcohol use: Yes    Alcohol/week: 1.0 standard drink of alcohol    Types: 1 Glasses of wine per week    Comment: Socially   Drug use: Yes    Types: Marijuana    Comment: daily   Sexual activity: Yes    Birth control/protection: None  Other Topics Concern   Not on file  Social History Narrative   Not on file   Social Determinants of Health   Financial Resource Strain: Not on file  Food Insecurity: Not on file  Transportation Needs: Not on file  Physical Activity: Not on file  Stress: Not on file  Social Connections: Not on file    Allergies:  Allergies  Allergen Reactions   Lexapro [Escitalopram]     Made patient dizzy   Wellbutrin [Bupropion] Other (See Comments)    Brain fog    Metabolic Disorder Labs: Lab Results  Component Value Date   HGBA1C 4.9 01/26/2018   No results found for: "PROLACTIN" Lab Results  Component Value Date   CHOL 184 06/26/2021   TRIG 165 (H) 06/26/2021   HDL 40 06/26/2021   LDLCALC 115 (H) 06/26/2021   LDLCALC 110 (H) 08/22/2020   Lab Results  Component Value Date   TSH 2.060 01/21/2022   TSH 1.280 05/02/2021    Therapeutic Level Labs: No results found for: "LITHIUM" No results found for: "VALPROATE" No results found for: "CBMZ"  Current Medications: Current Outpatient Medications  Medication Sig Dispense Refill   LORazepam (ATIVAN) 0.5 MG tablet Take 1 tablet (0.5 mg total) by mouth 2 (two) times daily as needed for anxiety. 20 tablet 0   metoprolol succinate (TOPROL-XL) 25 MG 24 hr tablet Take 0.5 tablets (12.5 mg total) by mouth daily. 45 tablet 1   omeprazole (PRILOSEC) 20 MG capsule Take 1 capsule (20 mg total) by mouth daily. 90 capsule 1   PARoxetine (PAXIL) 20 MG tablet Take 0.5-1 tablets (10-20 mg total) by mouth as directed. Start taking half tablet 10 mg daily for 7 days and increase to 20 mg, one tablet daily after that 27 tablet 1   tamsulosin (FLOMAX) 0.4 MG CAPS capsule Take 1 capsule (0.4 mg total) by mouth  daily. 90 capsule 1   doxepin (SINEQUAN) 10 MG capsule Take 1 capsule (10 mg total) by mouth at bedtime. (Patient not taking: Reported on 06/26/2022) 30 capsule 1   FLUoxetine (PROZAC) 10 MG capsule Take 1 capsule (10 mg total) by mouth daily. Tapering off , Take for 3 weeks and stop 21 capsule 0   No current facility-administered medications for this visit.     Musculoskeletal: Strength & Muscle Tone: within normal limits Gait & Station: normal Patient leans: N/A  Psychiatric Specialty Exam: Review of Systems  Psychiatric/Behavioral:  Positive for decreased concentration and dysphoric mood. The patient is nervous/anxious.   All other systems reviewed and are negative.   Blood pressure (!) 149/85, pulse 77, temperature  98.3 F (36.8 C), temperature source Oral, height 6' (1.829 m), weight 258 lb 6.4 oz (117.2 kg), SpO2 97 %.Body mass index is 35.05 kg/m.  General Appearance: Casual  Eye Contact:  Fair  Speech:  Clear and Coherent  Volume:  Normal  Mood:  Anxious and Depressed  Affect:  Congruent  Thought Process:  Goal Directed and Descriptions of Associations: Intact  Orientation:  Full (Time, Place, and Person)  Thought Content: Logical   Suicidal Thoughts:  No  Homicidal Thoughts:  No  Memory:  Immediate;   Fair Recent;   Fair Remote;   Fair  Judgement:  Fair  Insight:  Fair  Psychomotor Activity:  Normal  Concentration:  Concentration: Fair and Attention Span: Fair  Recall:  [employer] of Knowledge: Fair  Language: Fair  Akathisia:  No  Handed:  Right  AIMS (if indicated): not done  Assets:  Communication Skills Desire for Improvement Housing Social Support  ADL's:  Intact  Cognition: WNL  Sleep:  Fair   Screenings: GAD-7    Personnel officer Visit from 06/26/2022 in [place] Office Visit from 05/11/2022 in [place] Office Visit from 04/13/2022 in [place] Office Visit from 03/26/2022 in [place] Office Visit from 02/19/2022 in [place]  Total GAD-7 Score '5 8 10 4 4      '$ PHQ2-9    [place] Office Visit from 06/26/2022 in [place] Office Visit from 05/11/2022 in [place] Office Visit from 04/13/2022 in [place] Office Visit from 03/26/2022 in [place] Office Visit from 02/19/2022 in [place]  PHQ-2 Total Score '2 2 2 6 5  '$ PHQ-9 Total Score '9 10 8 16 16      '$ [place] Office Visit from 06/26/2022 in [place] Office Visit from 05/11/2022 in [place] Office Visit from 03/26/2022 in [place] No Risk No Risk No Risk        Assessment and Plan: Grigor Lipschutz is a 32 year old Caucasian male, lives with his significant other in [place], employed, has a history of depression, anxiety, GERD was evaluated in office today.  Patient continues to struggle with depression, possible side effects to Prozac, will benefit from the following plan.  Plan MDD-unstable Taper of Prozac, advised to take Prozac 10 mg p.o. daily for 3 weeks and stop using it. Patient advised to start Paxil 10 mg p.o. daily in a week from now and increase to 20 mg p.o. daily after stopping Prozac. Continue doxepin 10 mg p.o. nightly as needed for sleep-has not been using it.  Patient provided drug to drug interaction education, adverse side effects as well as discussed interaction with medications like metoprolol. Patient referred for CBT-currently on a wait list.  Social anxiety disorder-unstable Referred for CBT  Long-term current use of  cannabis-improving Patient has been sober since the past few weeks.  Provided education.  High risk medication use-reviewed and discussed labs-03/26/2022-invalid-discussed retesting, since cannabinoid confirmation was invalid.  Otherwise negative.   Follow-up in clinic in 4 weeks or sooner if needed.   This note was generated in part or whole with voice recognition software. Voice recognition is usually quite accurate but there are transcription errors  that can and very often do occur. I apologize for any typographical errors that were not detected and corrected.     Ursula Alert, MD 06/26/2022, 1:19 PM

## 2022-06-26 NOTE — Telephone Encounter (Signed)
Noted  

## 2022-06-26 NOTE — Telephone Encounter (Signed)
pt pcp was faxed and confirmed the blood pressure readings for today visit with dr. Shea Evans.

## 2022-07-07 ENCOUNTER — Ambulatory Visit: Payer: BC Managed Care – PPO | Admitting: Family Medicine

## 2022-07-07 ENCOUNTER — Encounter: Payer: Self-pay | Admitting: Family Medicine

## 2022-07-07 VITALS — BP 134/86 | HR 105 | Ht 72.0 in | Wt 262.2 lb

## 2022-07-07 DIAGNOSIS — L259 Unspecified contact dermatitis, unspecified cause: Secondary | ICD-10-CM

## 2022-07-07 DIAGNOSIS — I10 Essential (primary) hypertension: Secondary | ICD-10-CM

## 2022-07-07 MED ORDER — METOPROLOL SUCCINATE ER 25 MG PO TB24: 25.0000 mg | ORAL_TABLET | Freq: Every day | ORAL | 1 refills | Status: DC

## 2022-07-07 MED ORDER — TRIAMCINOLONE ACETONIDE 0.025 % EX OINT: 1.0000 | TOPICAL_OINTMENT | Freq: Two times a day (BID) | CUTANEOUS | 0 refills | Status: DC

## 2022-07-07 NOTE — Assessment & Plan Note (Signed)
Will increase his metoprolol to 77m and recheck 1 month. Call with any concerns.

## 2022-07-07 NOTE — Progress Notes (Signed)
BP 134/86   Pulse (!) 105   Ht 6' (1.829 m)   Wt 262 lb 3.2 oz (118.9 kg)   SpO2 99%   BMI 35.56 kg/m    Subjective:    Patient ID: Jacob [name], male    DOB: September 17, 1990, 32 y.o.   MRN: [id number]  HPI: Jacob [name] is a 32 y.o. male  Chief Complaint  Patient presents with   Hypertension   HYPERTENSION  Hypertension status: uncontrolled  Satisfied with current treatment? no Duration of hypertension: chronic BP monitoring frequency:   few times a year BP range: High 130s-150s/80s BP medication side effects:  no Medication compliance: excellent compliance Previous BP meds:metoprolol Aspirin: no Recurrent headaches: no Visual changes: no Palpitations: no Dyspnea: occasionally Chest pain: no Lower extremity edema: no Dizzy/lightheaded: no  SKIN LESION Duration: about a month Location: scrotum Painful: no Itching: yes Onset: sudden Context: not changing Associated signs and symptoms: none History of skin cancer: no   Relevant past medical, surgical, family and social history reviewed and updated as indicated. Interim medical history since our last visit reviewed. Allergies and medications reviewed and updated.  Review of Systems  Constitutional: Negative.   Respiratory: Negative.    Cardiovascular: Negative.   Gastrointestinal: Negative.   Musculoskeletal: Negative.   Psychiatric/Behavioral: Negative.      Per HPI unless specifically indicated above     Objective:    BP 134/86   Pulse (!) 105   Ht 6' (1.829 m)   Wt 262 lb 3.2 oz (118.9 kg)   SpO2 99%   BMI 35.56 kg/m   Wt Readings from Last 3 Encounters:  07/07/22 262 lb 3.2 oz (118.9 kg)  04/13/22 245 lb 12.8 oz (111.5 kg)  02/19/22 243 lb 8 oz (110.5 kg)    Physical Exam Vitals and nursing note reviewed.  Constitutional:      General: He is not in acute distress.    Appearance: Normal appearance. He is obese. He is not ill-appearing, toxic-appearing or diaphoretic.  HENT:     Head:  Normocephalic and atraumatic.     Right Ear: External ear normal.     Left Ear: External ear normal.     Nose: Nose normal.     Mouth/Throat:     Mouth: Mucous membranes are moist.     Pharynx: Oropharynx is clear.  Eyes:     General: No scleral icterus.       Right eye: No discharge.        Left eye: No discharge.     Extraocular Movements: Extraocular movements intact.     Conjunctiva/sclera: Conjunctivae normal.     Pupils: Pupils are equal, round, and reactive to light.  Cardiovascular:     Rate and Rhythm: Regular rhythm. Tachycardia present.     Pulses: Normal pulses.     Heart sounds: Normal heart sounds. No murmur heard.    No friction rub. No gallop.  Pulmonary:     Effort: Pulmonary effort is normal. No respiratory distress.     Breath sounds: Normal breath sounds. No stridor. No wheezing, rhonchi or rales.  Chest:     Chest wall: No tenderness.  Musculoskeletal:        General: Normal range of motion.     Cervical back: Normal range of motion and neck supple.  Skin:    General: Skin is warm and dry.     Capillary Refill: Capillary refill takes less than 2 seconds.     Coloration: Skin  is not jaundiced or pale.     Findings: No bruising, erythema, lesion or rash.     Comments: Hypopigmented areas on scrotum  Neurological:     General: No focal deficit present.     Mental Status: He is alert and oriented to person, place, and time. Mental status is at baseline.  Psychiatric:        Mood and Affect: Mood normal.        Behavior: Behavior normal.        Thought Content: Thought content normal.        Judgment: Judgment normal.     Results for orders placed or performed during the hospital encounter of 03/26/22  Urine drugs of abuse scrn w alc, routine (Ref Lab)  Result Value Ref Range   Amphetamines, Urine Negative Cutoff=1000 ng/mL   Barbiturate, Ur Negative Cutoff=300 ng/mL   Benzodiazepine Quant, Ur Negative Cutoff=300 ng/mL   Cannabinoid Quant, Ur See  Final Results Cutoff=50 ng/mL   Cocaine (Metab.) Negative Cutoff=300 ng/mL   Opiate Quant, Ur Negative Cutoff=300 ng/mL   Phencyclidine, Ur Negative Cutoff=25 ng/mL   Methadone Screen, Urine Negative Cutoff=300 ng/mL   Propoxyphene, Urine Negative Cutoff=300 ng/mL   Ethanol U, Quan Negative Cutoff=0.020 %  Panel [id number]  Result Value Ref Range   Cannabinoid GC/MS, Ur Comment: Cutoff=50      Assessment & Plan:   Problem List Items Addressed This Visit       Cardiovascular and Mediastinum   Primary hypertension - Primary    Will increase his metoprolol to 26m and recheck 1 month. Call with any concerns.       Relevant Medications   metoprolol succinate (TOPROL-XL) 25 MG 24 hr tablet   Other Relevant Orders   Urine Microalbumin w/creat. ratio   Basic metabolic panel   Other Visit Diagnoses     Contact dermatitis, unspecified contact dermatitis type, unspecified trigger       Will treat with triamcinalone ointment. Call if not getting better or getting worse.        Follow up plan: Return in about 4 weeks (around 08/04/2022) for physical.

## 2022-07-08 LAB — MICROALBUMIN / CREATININE URINE RATIO
Creatinine, Urine: 133.5 mg/dL
Microalb/Creat Ratio: 13 mg/g creat (ref 0–29)
Microalbumin, Urine: 17 ug/mL

## 2022-07-08 LAB — BASIC METABOLIC PANEL
BUN/Creatinine Ratio: 14 (ref 9–20)
BUN: 12 mg/dL (ref 6–20)
CO2: 26 mmol/L (ref 20–29)
Calcium: 9.7 mg/dL (ref 8.7–10.2)
Chloride: 102 mmol/L (ref 96–106)
Creatinine, Ser: 0.83 mg/dL (ref 0.76–1.27)
Glucose: 92 mg/dL (ref 70–99)
Potassium: 4.3 mmol/L (ref 3.5–5.2)
Sodium: 142 mmol/L (ref 134–144)
eGFR: 120 mL/min/{1.73_m2} (ref >59)

## 2022-07-31 ENCOUNTER — Other Ambulatory Visit: Payer: Self-pay | Admitting: Psychiatry

## 2022-07-31 DIAGNOSIS — F331 Major depressive disorder, recurrent, moderate: Secondary | ICD-10-CM

## 2022-07-31 DIAGNOSIS — F4010 Social phobia, unspecified: Secondary | ICD-10-CM

## 2022-08-05 ENCOUNTER — Encounter: Payer: Self-pay | Admitting: Family Medicine

## 2022-08-05 ENCOUNTER — Ambulatory Visit (INDEPENDENT_AMBULATORY_CARE_PROVIDER_SITE_OTHER): Payer: BC Managed Care – PPO | Admitting: Family Medicine

## 2022-08-05 VITALS — BP 136/78 | HR 76 | Temp 98.6°F | Ht 72.0 in | Wt 270.5 lb

## 2022-08-05 DIAGNOSIS — F411 Generalized anxiety disorder: Secondary | ICD-10-CM

## 2022-08-05 DIAGNOSIS — K219 Gastro-esophageal reflux disease without esophagitis: Secondary | ICD-10-CM | POA: Diagnosis not present

## 2022-08-05 DIAGNOSIS — I10 Essential (primary) hypertension: Secondary | ICD-10-CM | POA: Diagnosis not present

## 2022-08-05 DIAGNOSIS — F331 Major depressive disorder, recurrent, moderate: Secondary | ICD-10-CM | POA: Diagnosis not present

## 2022-08-05 DIAGNOSIS — Z0000 Encounter for general adult medical examination without abnormal findings: Secondary | ICD-10-CM | POA: Diagnosis not present

## 2022-08-05 DIAGNOSIS — Z136 Encounter for screening for cardiovascular disorders: Secondary | ICD-10-CM | POA: Diagnosis not present

## 2022-08-05 LAB — URINALYSIS, ROUTINE W REFLEX MICROSCOPIC
Bilirubin, UA: NEGATIVE
Glucose, UA: NEGATIVE
Ketones, UA: NEGATIVE
Leukocytes,UA: NEGATIVE
Nitrite, UA: NEGATIVE
RBC, UA: NEGATIVE
Specific Gravity, UA: 1.02 (ref 1.005–1.030)
Urobilinogen, Ur: 1 mg/dL (ref 0.2–1.0)
pH, UA: 7 (ref 5.0–7.5)

## 2022-08-05 LAB — MICROALBUMIN, URINE WAIVED
Creatinine, Urine Waived: 200 mg/dL (ref 10–300)
Microalb, Ur Waived: 30 mg/L — ABNORMAL HIGH (ref 0–19)
Microalb/Creat Ratio: <30 mg/g (ref <30)

## 2022-08-05 MED ORDER — OMEPRAZOLE 20 MG PO CPDR: 20.0000 mg | DELAYED_RELEASE_CAPSULE | Freq: Every day | ORAL | 1 refills | Status: AC

## 2022-08-05 MED ORDER — METOPROLOL SUCCINATE ER 25 MG PO TB24: 25.0000 mg | ORAL_TABLET | Freq: Every day | ORAL | 0 refills | Status: DC

## 2022-08-05 NOTE — Assessment & Plan Note (Signed)
Continue to follow with psychiatry. Call with any concerns. Continue to monitor.  

## 2022-08-05 NOTE — Assessment & Plan Note (Signed)
Under good control on current regimen. Continue current regimen. Continue to monitor. Call with any concerns. Refills given. Labs drawn today.   

## 2022-08-05 NOTE — Progress Notes (Signed)
BP 136/78 (BP Location: Left Arm, Cuff Size: Normal)   Pulse 76   Temp 98.6 F (37 C) (Oral)   Ht 6' (1.829 m)   Wt 270 lb 8 oz (122.7 kg)   SpO2 98%   BMI 36.69 kg/m    Subjective:    Patient ID: Jacob [name], male    DOB: [birth year], 32 y.o.   MRN: [id number]  HPI: Jacob [name] is a 32 y.o. male presenting on 08/05/2022 for comprehensive medical examination. Current medical complaints include:  HYPERTENSION  Hypertension status: controlled  Satisfied with current treatment? yes Duration of hypertension: chronic BP monitoring frequency:  not checking BP medication side effects:  no Medication compliance: excellent compliance Previous BP meds:metoprolol Aspirin: no Recurrent headaches: no Visual changes: no Palpitations: no Dyspnea: no Chest pain: no Lower extremity edema: no Dizzy/lightheaded: no  DEPRESSION- continues to work with psychiatrist Mood status: stable Satisfied with current treatment?: working on it Symptom severity: moderate  Duration of current treatment : chronic Side effects: no Medication compliance: excellent compliance Psychotherapy/counseling: yes current Depressed mood: yes Anxious mood: yes Anhedonia: no Significant weight loss or gain: no Insomnia: no  Fatigue: yes Feelings of worthlessness or guilt: yes Impaired concentration/indecisiveness: no Suicidal ideations: no Hopelessness: no Crying spells: no    08/05/2022    8:54 AM 07/07/2022   10:01 AM 06/26/2022   10:41 AM 05/11/2022    3:52 PM 04/13/2022    9:09 AM  Depression screen PHQ 2/9  Decreased Interest 0 3   1  Down, Depressed, Hopeless '1 1   1  '$ PHQ - 2 Score '1 4   2  '$ Altered sleeping 1 0   3  Tired, decreased energy '3 3   2  '$ Change in appetite 0 0   1  Feeling bad or failure about yourself  2 1   0  Trouble concentrating 0 0   0  Moving slowly or fidgety/restless 0 0   0  Suicidal thoughts 0 0   0  PHQ-9 Score '7 8   8  '$ Difficult doing work/chores Very difficult Very  difficult   Somewhat difficult     Information is confidential and restricted. Go to Review Flowsheets to unlock data.    Interim Problems from his last visit: no  Depression Screen done today and results listed below:     08/05/2022    8:54 AM 07/07/2022   10:01 AM 06/26/2022   10:41 AM 05/11/2022    3:52 PM 04/13/2022    9:09 AM  Depression screen PHQ 2/9  Decreased Interest 0 3   1  Down, Depressed, Hopeless '1 1   1  '$ PHQ - 2 Score '1 4   2  '$ Altered sleeping 1 0   3  Tired, decreased energy '3 3   2  '$ Change in appetite 0 0   1  Feeling bad or failure about yourself  2 1   0  Trouble concentrating 0 0   0  Moving slowly or fidgety/restless 0 0   0  Suicidal thoughts 0 0   0  PHQ-9 Score '7 8   8  '$ Difficult doing work/chores Very difficult Very difficult   Somewhat difficult     Information is confidential and restricted. Go to Review Flowsheets to unlock data.    Past Medical History:  Past Medical History:  Diagnosis Date   Anxiety    Depression     Surgical History:  Past Surgical History:  Procedure Laterality  Date   COLONOSCOPY WITH PROPOFOL N/A 06/04/2021   Procedure: COLONOSCOPY WITH PROPOFOL;  Surgeon: Lin Landsman, MD;  Location: [facility] ENDOSCOPY;  Service: Gastroenterology;  Laterality: N/A;   ESOPHAGOGASTRODUODENOSCOPY (EGD) WITH PROPOFOL N/A 09/11/2020   Procedure: ESOPHAGOGASTRODUODENOSCOPY (EGD) WITH PROPOFOL;  Surgeon: Lin Landsman, MD;  Location: [facility] ENDOSCOPY;  Service: Gastroenterology;  Laterality: N/A;   FLEXIBLE SIGMOIDOSCOPY N/A 09/11/2020   Procedure: FLEXIBLE SIGMOIDOSCOPY;  Surgeon: Lin Landsman, MD;  Location: [facility] ENDOSCOPY;  Service: Gastroenterology;  Laterality: N/A;    Medications:  Current Outpatient Medications on File Prior to Visit  Medication Sig   FLUoxetine (PROZAC) 10 MG capsule Take 1 capsule (10 mg total) by mouth daily. Tapering off , Take for 3 weeks and stop   LORazepam (ATIVAN) 0.5 MG tablet Take 1 tablet  (0.5 mg total) by mouth 2 (two) times daily as needed for anxiety.   PARoxetine (PAXIL) 20 MG tablet Take 0.5-1 tablets (10-20 mg total) by mouth as directed. Start taking half tablet 10 mg daily for 7 days and increase to 20 mg, one tablet daily after that   triamcinolone (KENALOG) 0.025 % ointment Apply 1 Application topically 2 (two) times daily.   No current facility-administered medications on file prior to visit.    Allergies:  Allergies  Allergen Reactions   Lexapro [Escitalopram]     Made patient dizzy   Wellbutrin [Bupropion] Other (See Comments)    Brain fog    Social History:  Social History   Socioeconomic History   Marital status: Soil scientist    Spouse name: Not on file   Number of children: Not on file   Years of education: Not on file   Highest education level: Some college, no degree  Occupational History   Not on file  Tobacco Use   Smoking status: Never   Smokeless tobacco: Never  Vaping Use   Vaping Use: Some days   Substances: THC  Substance and Sexual Activity   Alcohol use: Yes    Alcohol/week: 1.0 standard drink of alcohol    Types: 1 Glasses of wine per week    Comment: Socially   Drug use: Yes    Types: Marijuana    Comment: daily   Sexual activity: Yes    Birth control/protection: None  Other Topics Concern   Not on file  Social History Narrative   Not on file   Social Determinants of Health   Financial Resource Strain: Not on file  Food Insecurity: Not on file  Transportation Needs: Not on file  Physical Activity: Not on file  Stress: Not on file  Social Connections: Not on file  Intimate Partner Violence: Not on file   Social History   Tobacco Use  Smoking Status Never  Smokeless Tobacco Never   Social History   Substance and Sexual Activity  Alcohol Use Yes   Alcohol/week: 1.0 standard drink of alcohol   Types: 1 Glasses of wine per week   Comment: Socially    Family History:  Family History  Problem Relation  Age of Onset   ADD / ADHD Mother    Diabetes Mother    Early death Father    Autoimmune disease Father    Autoimmune disease Sister    Early death Brother    Autoimmune disease Brother    ADD / ADHD Maternal Grandmother    Autism spectrum disorder Half-Brother     Past medical history, surgical history, medications, allergies, family history and social history reviewed  with patient today and changes made to appropriate areas of the chart.   Review of Systems  Constitutional: Negative.   HENT: Negative.    Eyes: Negative.   Respiratory: Negative.    Cardiovascular: Negative.   Gastrointestinal:  Positive for blood in stool (occasionally). Negative for abdominal pain, constipation, diarrhea, heartburn, melena, nausea and vomiting.  Genitourinary: Negative.   Musculoskeletal: Negative.   Skin: Negative.   Neurological: Negative.   Endo/Heme/Allergies: Negative.   Psychiatric/Behavioral:  Positive for depression. Negative for hallucinations, memory loss, substance abuse and suicidal ideas. The patient is nervous/anxious. The patient does not have insomnia.    All other ROS negative except what is listed above and in the HPI.      Objective:    BP 136/78 (BP Location: Left Arm, Cuff Size: Normal)   Pulse 76   Temp 98.6 F (37 C) (Oral)   Ht 6' (1.829 m)   Wt 270 lb 8 oz (122.7 kg)   SpO2 98%   BMI 36.69 kg/m   Wt Readings from Last 3 Encounters:  08/05/22 270 lb 8 oz (122.7 kg)  07/07/22 262 lb 3.2 oz (118.9 kg)  04/13/22 245 lb 12.8 oz (111.5 kg)    Physical Exam Vitals and nursing note reviewed.  Constitutional:      General: He is not in acute distress.    Appearance: Normal appearance. He is obese. He is not ill-appearing, toxic-appearing or diaphoretic.  HENT:     Head: Normocephalic and atraumatic.     Right Ear: Tympanic membrane, ear canal and external ear normal. There is no impacted cerumen.     Left Ear: Tympanic membrane, ear canal and external ear  normal. There is no impacted cerumen.     Nose: Nose normal. No congestion or rhinorrhea.     Mouth/Throat:     Mouth: Mucous membranes are moist.     Pharynx: Oropharynx is clear. No oropharyngeal exudate or posterior oropharyngeal erythema.  Eyes:     General: No scleral icterus.       Right eye: No discharge.        Left eye: No discharge.     Extraocular Movements: Extraocular movements intact.     Conjunctiva/sclera: Conjunctivae normal.     Pupils: Pupils are equal, round, and reactive to light.  Neck:     Vascular: No carotid bruit.  Cardiovascular:     Rate and Rhythm: Normal rate and regular rhythm.     Pulses: Normal pulses.     Heart sounds: No murmur heard.    No friction rub. No gallop.  Pulmonary:     Effort: Pulmonary effort is normal. No respiratory distress.     Breath sounds: Normal breath sounds. No stridor. No wheezing, rhonchi or rales.  Chest:     Chest wall: No tenderness.  Abdominal:     General: Abdomen is flat. Bowel sounds are normal. There is no distension.     Palpations: Abdomen is soft. There is no mass.     Tenderness: There is no abdominal tenderness. There is no right CVA tenderness, left CVA tenderness, guarding or rebound.     Hernia: No hernia is present.  Genitourinary:    Comments: Genital exam deferred with shared decision making Musculoskeletal:        General: No swelling, tenderness, deformity or signs of injury.     Cervical back: Normal range of motion and neck supple. No rigidity. No muscular tenderness.     Right lower leg:  No edema.     Left lower leg: No edema.  Lymphadenopathy:     Cervical: No cervical adenopathy.  Skin:    General: Skin is warm and dry.     Capillary Refill: Capillary refill takes less than 2 seconds.     Coloration: Skin is not jaundiced or pale.     Findings: No bruising, erythema, lesion or rash.  Neurological:     General: No focal deficit present.     Mental Status: He is alert and oriented to  person, place, and time.     Cranial Nerves: No cranial nerve deficit.     Sensory: No sensory deficit.     Motor: No weakness.     Coordination: Coordination normal.     Gait: Gait normal.     Deep Tendon Reflexes: Reflexes normal.  Psychiatric:        Mood and Affect: Mood normal.        Behavior: Behavior normal.        Thought Content: Thought content normal.        Judgment: Judgment normal.     Results for orders placed or performed in visit on 07/07/22  Urine Microalbumin w/creat. ratio  Result Value Ref Range   Creatinine, Urine 133.5 Not Estab. mg/dL   Microalbumin, Urine 17.0 Not Estab. ug/mL   Microalb/Creat Ratio 13 0 - 29 mg/g creat  Basic metabolic panel  Result Value Ref Range   Glucose 92 70 - 99 mg/dL   BUN 12 6 - 20 mg/dL   Creatinine, Ser 0.83 0.76 - 1.27 mg/dL   eGFR 120 >59 mL/min/1.73   BUN/Creatinine Ratio 14 9 - 20   Sodium 142 134 - 144 mmol/L   Potassium 4.3 3.5 - 5.2 mmol/L   Chloride 102 96 - 106 mmol/L   CO2 26 20 - 29 mmol/L   Calcium 9.7 8.7 - 10.2 mg/dL      Assessment & Plan:   Problem List Items Addressed This Visit       Cardiovascular and Mediastinum   Primary hypertension    Under good control on current regimen. Continue current regimen. Continue to monitor. Call with any concerns. Refills given. Labs drawn today.       Relevant Medications   metoprolol succinate (TOPROL-XL) 25 MG 24 hr tablet     Digestive   Chronic GERD    Under good control on current regimen. Continue current regimen. Continue to monitor. Call with any concerns. Refills given. Labs drawn today.      Relevant Medications   omeprazole (PRILOSEC) 20 MG capsule     Other   Depression    Continue to follow with psychiatry. Call with any concerns. Continue to monitor.       GAD (generalized anxiety disorder)    Continue to follow with psychiatry. Call with any concerns. Continue to monitor.       Other Visit Diagnoses     Routine general medical  examination at a health care facility    -  Primary   Vaccines up to date. Screening labs checked today. Continue diet and exercise. Call with any concerns.   Relevant Orders   Comprehensive metabolic panel   CBC with Differential/Platelet   Lipid Panel w/o Chol/HDL Ratio   TSH   Urinalysis, Routine w reflex microscopic   Microalbumin, Urine Waived       LABORATORY TESTING:  Health maintenance labs ordered today as discussed above.   IMMUNIZATIONS:   - Tdap:  Tetanus vaccination status reviewed: last tetanus booster within 10 years. - Influenza: Up to date - Pneumovax: Not applicable - COVID: Up to date - HPV: Refused   PATIENT COUNSELING:    Sexuality: Discussed sexually transmitted diseases, partner selection, use of condoms, avoidance of unintended pregnancy  and contraceptive alternatives.   Advised to avoid cigarette smoking.  I discussed with the patient that most people either abstain from alcohol or drink within safe limits (<=14/week and <=4 drinks/occasion for males, <=7/weeks and <= 3 drinks/occasion for females) and that the risk for alcohol disorders and other health effects rises proportionally with the number of drinks per week and how often a drinker exceeds daily limits.  Discussed cessation/primary prevention of drug use and availability of treatment for abuse.   Diet: Encouraged to adjust caloric intake to maintain  or achieve ideal body weight, to reduce intake of dietary saturated fat and total fat, to limit sodium intake by avoiding high sodium foods and not adding table salt, and to maintain adequate dietary potassium and calcium preferably from fresh fruits, vegetables, and low-fat dairy products.    stressed the importance of regular exercise  Injury prevention: Discussed safety belts, safety helmets, smoke detector, smoking near bedding or upholstery.   Dental health: Discussed importance of regular tooth brushing, flossing, and dental visits.    Follow up plan: NEXT PREVENTATIVE PHYSICAL DUE IN 1 YEAR. Return in about 6 months (around 02/05/2023).

## 2022-08-06 LAB — LIPID PANEL W/O CHOL/HDL RATIO
Cholesterol, Total: 195 mg/dL (ref 100–199)
HDL: 38 mg/dL — ABNORMAL LOW (ref >39)
LDL Chol Calc (NIH): 119 mg/dL — ABNORMAL HIGH (ref 0–99)
Triglycerides: 216 mg/dL — ABNORMAL HIGH (ref 0–149)
VLDL Cholesterol Cal: 38 mg/dL (ref 5–40)

## 2022-08-06 LAB — COMPREHENSIVE METABOLIC PANEL
ALT: 39 IU/L (ref 0–44)
AST: 24 IU/L (ref 0–40)
Albumin/Globulin Ratio: 2.9 — ABNORMAL HIGH (ref 1.2–2.2)
Albumin: 4.7 g/dL (ref 4.1–5.1)
Alkaline Phosphatase: 84 IU/L (ref 44–121)
BUN/Creatinine Ratio: 13 (ref 9–20)
BUN: 12 mg/dL (ref 6–20)
Bilirubin Total: 0.7 mg/dL (ref 0.0–1.2)
CO2: 25 mmol/L (ref 20–29)
Calcium: 9.7 mg/dL (ref 8.7–10.2)
Chloride: 104 mmol/L (ref 96–106)
Creatinine, Ser: 0.9 mg/dL (ref 0.76–1.27)
Globulin, Total: 1.6 g/dL (ref 1.5–4.5)
Glucose: 105 mg/dL — ABNORMAL HIGH (ref 70–99)
Potassium: 4.4 mmol/L (ref 3.5–5.2)
Sodium: 143 mmol/L (ref 134–144)
Total Protein: 6.3 g/dL (ref 6.0–8.5)
eGFR: 117 mL/min/{1.73_m2} (ref >59)

## 2022-08-06 LAB — CBC WITH DIFFERENTIAL/PLATELET
Basophils Absolute: 0 10*3/uL (ref 0.0–0.2)
Basos: 0 %
EOS (ABSOLUTE): 0.1 10*3/uL (ref 0.0–0.4)
Eos: 2 %
Hematocrit: 47.6 % (ref 37.5–51.0)
Hemoglobin: 16.3 g/dL (ref 13.0–17.7)
Immature Grans (Abs): 0 10*3/uL (ref 0.0–0.1)
Immature Granulocytes: 0 %
Lymphocytes Absolute: 1.5 10*3/uL (ref 0.7–3.1)
Lymphs: 28 %
MCH: 30.5 pg (ref 26.6–33.0)
MCHC: 34.2 g/dL (ref 31.5–35.7)
MCV: 89 fL (ref 79–97)
Monocytes Absolute: 0.3 10*3/uL (ref 0.1–0.9)
Monocytes: 6 %
Neutrophils Absolute: 3.5 10*3/uL (ref 1.4–7.0)
Neutrophils: 64 %
Platelets: 185 10*3/uL (ref 150–450)
RBC: 5.34 x10E6/uL (ref 4.14–5.80)
RDW: 12.3 % (ref 11.6–15.4)
WBC: 5.5 10*3/uL (ref 3.4–10.8)

## 2022-08-06 LAB — TSH: TSH: 1.4 u[IU]/mL (ref 0.450–4.500)

## 2022-08-16 ENCOUNTER — Other Ambulatory Visit: Payer: Self-pay | Admitting: Family Medicine

## 2022-08-17 NOTE — Telephone Encounter (Signed)
Unable to refill per protocol, Rx expired. Discontinued 02/19/22/  Requested Prescriptions  Pending Prescriptions Disp Refills   FLUoxetine (PROZAC) 40 MG capsule [Pharmacy Med Name: FLUOXETINE HCL 40 MG CAPSULE] 90 capsule 1    Sig: TAKE 1 CAPSULE (40 MG TOTAL) BY MOUTH DAILY.     Psychiatry:  Antidepressants - SSRI Passed - 08/16/2022  8:42 AM      Passed - Completed PHQ-2 or PHQ-9 in the last 360 days      Passed - Valid encounter within last 6 months    Recent Outpatient Visits           1 week ago Routine general medical examination at a health care facility   [place], [place], DO   1 month ago Primary hypertension   [place], [place], DO   4 months ago [place], DO   5 months ago Need for influenza vaccination   [place] P, DO   6 months ago Moderate episode of recurrent major depressive disorder [facility])   [place], Megan P, DO       Future Appointments             In 5 months Wynetta Emery, Barb Merino, DO [place], [facility]

## 2022-10-12 ENCOUNTER — Encounter: Payer: BC Managed Care – PPO | Admitting: Family Medicine

## 2023-01-10 ENCOUNTER — Other Ambulatory Visit: Payer: Self-pay | Admitting: Family Medicine

## 2023-01-12 NOTE — Telephone Encounter (Signed)
Requested medication (s) are due for refill today - yes  Requested medication (s) are on the active medication list -yes  Future visit scheduled -yes  Last refill: 08/05/22 #90  Notes to clinic: Please review dosing- requested dosing is different for current dosing on list  Requested Prescriptions  Pending Prescriptions Disp Refills   metoprolol succinate (TOPROL-XL) 25 MG 24 hr tablet [Pharmacy Med Name: METOPROLOL SUCC ER 25 MG TAB] 45 tablet 1    Sig: TAKE 1/2 TABLET BY MOUTH EVERY DAY     Cardiovascular:  Beta Blockers Passed - 01/10/2023  8:45 AM      Passed - Last BP in normal range    BP Readings from Last 1 Encounters:  08/05/22 136/78         Passed - Last Heart Rate in normal range    Pulse Readings from Last 1 Encounters:  08/05/22 76         Passed - Valid encounter within last 6 months    Recent Outpatient Visits           5 months ago Routine general medical examination at a health care facility   [facility], [place] P, DO   6 months ago Primary hypertension   [place] [facility] [place], [place], DO   9 months ago Palpitations   [place] [facility] [place], [place] P, DO   10 months ago Need for influenza vaccination   [facility], [place] P, DO   11 months ago Moderate episode of recurrent major depressive disorder [facility])   [place] [facility] [place], Oralia Rud, DO       Future Appointments             In 3 weeks Laural Benes, Oralia Rud, DO [place] [facility], [facility]               Requested Prescriptions  Pending Prescriptions Disp Refills   metoprolol succinate (TOPROL-XL) 25 MG 24 hr tablet [Pharmacy Med Name: METOPROLOL SUCC ER 25 MG TAB] 45 tablet 1    Sig: TAKE 1/2 TABLET BY MOUTH EVERY DAY     Cardiovascular:  Beta Blockers Passed - 01/10/2023  8:45 AM      Passed - Last BP in normal range    BP Readings from Last 1  Encounters:  08/05/22 136/78         Passed - Last Heart Rate in normal range    Pulse Readings from Last 1 Encounters:  08/05/22 76         Passed - Valid encounter within last 6 months    Recent Outpatient Visits           5 months ago Routine general medical examination at a health care facility   [facility] [place], [place] P, DO   6 months ago Primary hypertension   [place] [facility] [place], [place], DO   9 months ago Palpitations   [place] [facility] [place], [place] P, DO   10 months ago Need for influenza vaccination   [place] [facility] [place], Megan P, DO   11 months ago Moderate episode of recurrent major depressive disorder [facility])   [place] [facility] [place], Oralia Rud, DO       Future Appointments             In 3 weeks Laural Benes,  [place] Rud, DO [facility], [facility]

## 2023-01-24 DIAGNOSIS — U071 COVID-19: Secondary | ICD-10-CM

## 2023-01-24 HISTORY — DX: COVID-19: U07.1

## 2023-02-05 ENCOUNTER — Ambulatory Visit
Admission: RE | Admit: 2023-02-05 | Discharge: 2023-02-05 | Disposition: A | Discharge status: Home / Self Care | Payer: BC Managed Care – PPO | Source: Ambulatory Visit | Attending: Family Medicine

## 2023-02-05 ENCOUNTER — Telehealth: Payer: Self-pay | Admitting: Family Medicine

## 2023-02-05 ENCOUNTER — Encounter: Payer: Self-pay | Admitting: Family Medicine

## 2023-02-05 ENCOUNTER — Ambulatory Visit: Payer: BC Managed Care – PPO | Admitting: Family Medicine

## 2023-02-05 VITALS — BP 135/81 | HR 89 | Temp 98.3°F | Wt 271.2 lb

## 2023-02-05 DIAGNOSIS — I10 Essential (primary) hypertension: Secondary | ICD-10-CM

## 2023-02-05 DIAGNOSIS — N132 Hydronephrosis with renal and ureteral calculous obstruction: Secondary | ICD-10-CM | POA: Diagnosis not present

## 2023-02-05 DIAGNOSIS — R1030 Lower abdominal pain, unspecified: Secondary | ICD-10-CM | POA: Diagnosis not present

## 2023-02-05 DIAGNOSIS — R1031 Right lower quadrant pain: Secondary | ICD-10-CM | POA: Diagnosis not present

## 2023-02-05 DIAGNOSIS — Q6211 Congenital occlusion of ureteropelvic junction: Secondary | ICD-10-CM

## 2023-02-05 DIAGNOSIS — N200 Calculus of kidney: Secondary | ICD-10-CM

## 2023-02-05 DIAGNOSIS — F331 Major depressive disorder, recurrent, moderate: Secondary | ICD-10-CM

## 2023-02-05 DIAGNOSIS — K5790 Diverticulosis of intestine, part unspecified, without perforation or abscess without bleeding: Secondary | ICD-10-CM | POA: Diagnosis not present

## 2023-02-05 LAB — URINALYSIS, ROUTINE W REFLEX MICROSCOPIC
Bilirubin, UA: NEGATIVE
Glucose, UA: NEGATIVE
Ketones, UA: NEGATIVE
Leukocytes,UA: NEGATIVE
Nitrite, UA: NEGATIVE
RBC, UA: NEGATIVE
Specific Gravity, UA: 1.015 (ref 1.005–1.030)
Urobilinogen, Ur: 1 mg/dL (ref 0.2–1.0)
pH, UA: 7.5 (ref 5.0–7.5)

## 2023-02-05 MED ORDER — IOHEXOL 350 MG/ML SOLN
100.0000 mL | Freq: Once | INTRAVENOUS | Status: AC | PRN
  Administered 2023-02-05: 100 mL via INTRAVENOUS

## 2023-02-05 MED ORDER — LISINOPRIL 10 MG PO TABS: 10.0000 mg | ORAL_TABLET | Freq: Every day | ORAL | 3 refills | Status: DC

## 2023-02-05 MED ORDER — IOHEXOL 9 MG/ML PO SOLN
500.0000 mL | ORAL | Status: AC
  Administered 2023-02-05 (×2): 500 mL via ORAL

## 2023-02-05 NOTE — Assessment & Plan Note (Signed)
Running OK here, but high at home. Will continue metoprolol and add lisinopril. Recheck 1 month. Call with any concerns.

## 2023-02-05 NOTE — Telephone Encounter (Signed)
Called patient with results. Not currently in pain. Warning signs to go to ER discussed. Will get him into urology ASAP. Urgent referral generated Appointment scheduled for Monday at 1:30

## 2023-02-05 NOTE — Progress Notes (Addendum)
BP 135/81   Pulse 89   Temp 98.3 F (36.8 C) (Oral)   Wt 271 lb 3.2 oz (123 kg)   SpO2 98%   BMI 36.78 kg/m    Subjective:    Patient ID: Jacob [name], male    DOB: July 26, 1990, 32 y.o.   MRN: [id number]  HPI: Jacob [name] is a 32 y.o. male  Chief Complaint  Patient presents with   Hypertension   Depression   HYPERTENSION  Hypertension status: exacerbated  Satisfied with current treatment? no Duration of hypertension: chronic BP monitoring frequency:  a few times a month BP range: 152/84, 161/85, 154/97, 158/94 BP medication side effects:  no Medication compliance: excellent compliance Previous BP meds:metoprolol Aspirin: no Recurrent headaches: no Visual changes: no Palpitations: no Dyspnea: no Chest pain: no Lower extremity edema: no Dizzy/lightheaded: no  DEPRESSION- was seeing psychiatry, but didn't feel like they were a good fit. Due to see a new one in November Mood status: uncontrolled Satisfied with current treatment?: no Symptom severity: moderate  Duration of current treatment : chronic Side effects: N/A Medication compliance: not currently on anything Psychotherapy/counseling: yes in the past Previous psychiatric medications: wellbutrin, lexapro, paxil, prozac Depressed mood: yes Anxious mood: yes Anhedonia: no Significant weight loss or gain: no Insomnia: no  Fatigue: yes Feelings of worthlessness or guilt: yes Impaired concentration/indecisiveness: yes Suicidal ideations: no Hopelessness: no Crying spells: yes    02/05/2023    9:06 AM 08/05/2022    8:54 AM 07/07/2022   10:01 AM 06/26/2022   10:41 AM 05/11/2022    3:52 PM  Depression screen PHQ 2/9  Decreased Interest 2 0 3    Down, Depressed, Hopeless 2 1 1     PHQ - 2 Score 4 1 4     Altered sleeping 3 1 0    Tired, decreased energy 3 3 3     Change in appetite 2 0 0    Feeling bad or failure about yourself  0 2 1    Trouble concentrating 2 0 0    Moving slowly or fidgety/restless 0 0 0     Suicidal thoughts 0 0 0    PHQ-9 Score 14 7 8     Difficult doing work/chores Very difficult Very difficult Very difficult       Information is confidential and restricted. Go to Review Flowsheets to unlock data.   ABDOMINAL PAIN  Duration: at least a few weeks Onset: gradual Severity: severe Quality: aching pain Location:  suprapubic". "lower abdominal quadrants  Episode duration: all day or most of the day, lasts a couple of days Radiation: into his groin Frequency: intermittent Alleviating factors: time Aggravating factors: possibly due to food  Status: fluctuating Treatments attempted: fiber supplements, prebiotics Fever: no Nausea: yes Vomiting: no Weight loss: no Decreased appetite: yes Diarrhea: yes Constipation: yes Blood in stool: no Heartburn: no Jaundice: no Rash: no Dysuria/urinary frequency: no Hematuria: no History of sexually transmitted disease: no Recurrent NSAID use: no   Relevant past medical, surgical, family and social history reviewed and updated as indicated. Interim medical history since our last visit reviewed. Allergies and medications reviewed and updated.  Review of Systems  Constitutional: Negative.   HENT: Negative.    Respiratory:  Positive for chest tightness. Negative for apnea, cough, choking, shortness of breath, wheezing and stridor.   Cardiovascular: Negative.   Gastrointestinal:  Positive for abdominal pain, constipation, diarrhea and nausea. Negative for abdominal distention, anal bleeding, blood in stool, rectal pain and vomiting.  Musculoskeletal:  Negative.   Skin: Negative.   Psychiatric/Behavioral: Negative.      Per HPI unless specifically indicated above     Objective:    BP 135/81   Pulse 89   Temp 98.3 F (36.8 C) (Oral)   Wt 271 lb 3.2 oz (123 kg)   SpO2 98%   BMI 36.78 kg/m   Wt Readings from Last 3 Encounters:  02/05/23 271 lb 3.2 oz (123 kg)  08/05/22 270 lb 8 oz (122.7 kg)  07/07/22 262 lb 3.2 oz  (118.9 kg)    Physical Exam Vitals and nursing note reviewed.  Constitutional:      General: He is not in acute distress.    Appearance: Normal appearance. He is obese. He is not ill-appearing, toxic-appearing or diaphoretic.  HENT:     Head: Normocephalic and atraumatic.     Right Ear: External ear normal.     Left Ear: External ear normal.     Nose: Nose normal.     Mouth/Throat:     Mouth: Mucous membranes are moist.     Pharynx: Oropharynx is clear.  Eyes:     General: No scleral icterus.       Right eye: No discharge.        Left eye: No discharge.     Extraocular Movements: Extraocular movements intact.     Conjunctiva/sclera: Conjunctivae normal.     Pupils: Pupils are equal, round, and reactive to light.  Cardiovascular:     Rate and Rhythm: Normal rate and regular rhythm.     Pulses: Normal pulses.     Heart sounds: Normal heart sounds. No murmur heard.    No friction rub. No gallop.  Pulmonary:     Effort: Pulmonary effort is normal. No respiratory distress.     Breath sounds: Normal breath sounds. No stridor. No wheezing, rhonchi or rales.  Chest:     Chest wall: No tenderness.  Abdominal:     General: Abdomen is flat. Bowel sounds are normal. There is no distension.     Palpations: Abdomen is soft. There is no mass.     Tenderness: There is no abdominal tenderness. There is no right CVA tenderness, left CVA tenderness, guarding or rebound.     Hernia: No hernia is present.  Musculoskeletal:        General: Normal range of motion.     Cervical back: Normal range of motion and neck supple.  Skin:    General: Skin is warm and dry.     Capillary Refill: Capillary refill takes less than 2 seconds.     Coloration: Skin is not jaundiced or pale.     Findings: No bruising, erythema, lesion or rash.  Neurological:     General: No focal deficit present.     Mental Status: He is alert and oriented to person, place, and time. Mental status is at baseline.  Psychiatric:         Mood and Affect: Mood is anxious.        Behavior: Behavior normal.        Thought Content: Thought content normal.        Judgment: Judgment normal.     Results for orders placed or performed in visit on 08/05/22  Comprehensive metabolic panel  Result Value Ref Range   Glucose 105 (H) 70 - 99 mg/dL   BUN 12 6 - 20 mg/dL   Creatinine, Ser 4.69 0.76 - 1.27 mg/dL   eGFR 629 >52 WU/XLK/4.40  BUN/Creatinine Ratio 13 9 - 20   Sodium 143 134 - 144 mmol/L   Potassium 4.4 3.5 - 5.2 mmol/L   Chloride 104 96 - 106 mmol/L   CO2 25 20 - 29 mmol/L   Calcium 9.7 8.7 - 10.2 mg/dL   Total Protein 6.3 6.0 - 8.5 g/dL   Albumin 4.7 4.1 - 5.1 g/dL   Globulin, Total 1.6 1.5 - 4.5 g/dL   Albumin/Globulin Ratio 2.9 (H) 1.2 - 2.2   Bilirubin Total 0.7 0.0 - 1.2 mg/dL   Alkaline Phosphatase 84 44 - 121 IU/L   AST 24 0 - 40 IU/L   ALT 39 0 - 44 IU/L  CBC with Differential/Platelet  Result Value Ref Range   WBC 5.5 3.4 - 10.8 x10E3/uL   RBC 5.34 4.14 - 5.80 x10E6/uL   Hemoglobin 16.3 13.0 - 17.7 g/dL   Hematocrit 69.6 29.5 - 51.0 %   MCV 89 79 - 97 fL   MCH 30.5 26.6 - 33.0 pg   MCHC 34.2 31.5 - 35.7 g/dL   RDW 28.4 13.2 - 44.0 %   Platelets 185 150 - 450 x10E3/uL   Neutrophils 64 Not Estab. %   Lymphs 28 Not Estab. %   Monocytes 6 Not Estab. %   Eos 2 Not Estab. %   Basos 0 Not Estab. %   Neutrophils Absolute 3.5 1.4 - 7.0 x10E3/uL   Lymphocytes Absolute 1.5 0.7 - 3.1 x10E3/uL   Monocytes Absolute 0.3 0.1 - 0.9 x10E3/uL   EOS (ABSOLUTE) 0.1 0.0 - 0.4 x10E3/uL   Basophils Absolute 0.0 0.0 - 0.2 x10E3/uL   Immature Granulocytes 0 Not Estab. %   Immature Grans (Abs) 0.0 0.0 - 0.1 x10E3/uL  Lipid Panel w/o Chol/HDL Ratio  Result Value Ref Range   Cholesterol, Total 195 100 - 199 mg/dL   Triglycerides 102 (H) 0 - 149 mg/dL   HDL 38 (L) >72 mg/dL   VLDL Cholesterol Cal 38 5 - 40 mg/dL   LDL Chol Calc (NIH) 536 (H) 0 - 99 mg/dL  TSH  Result Value Ref Range   TSH 1.400 0.450 -  4.500 uIU/mL  Urinalysis, Routine w reflex microscopic  Result Value Ref Range   Specific Gravity, UA 1.020 1.005 - 1.030   pH, UA 7.0 5.0 - 7.5   Color, UA Yellow Yellow   Appearance Ur Clear Clear   Leukocytes,UA Negative Negative   Protein,UA Trace (A) Negative/Trace   Glucose, UA Negative Negative   Ketones, UA Negative Negative   RBC, UA Negative Negative   Bilirubin, UA Negative Negative   Urobilinogen, Ur 1.0 0.2 - 1.0 mg/dL   Nitrite, UA Negative Negative   Microscopic Examination Comment   Microalbumin, Urine Waived  Result Value Ref Range   Microalb, Ur Waived 30 (H) 0 - 19 mg/L   Creatinine, Urine Waived 200 10 - 300 mg/dL   Microalb/Creat Ratio <30 <30 mg/g      Assessment & Plan:   Problem List Items Addressed This Visit       Cardiovascular and Mediastinum   Primary hypertension    Running OK here, but high at home. Will continue metoprolol and add lisinopril. Recheck 1 month. Call with any concerns.       Relevant Medications   lisinopril (ZESTRIL) 10 MG tablet     Other   Depression    Not under good control. Due to see new psychiatrist in November. Call with any issues before then.  Other Visit Diagnoses     Lower abdominal pain    -  Primary   Worsening episodic abdominal pain. Hx of kidney stones and colon polyps. Will obtain CT abdomen with oral contrast and labs. Await results.   Relevant Orders   CBC with Differential/Platelet   Comprehensive metabolic panel   Urinalysis, Routine w reflex microscopic   CT ABDOMEN PELVIS W CONTRAST        Follow up plan: Return in about 4 weeks (around 03/05/2023).

## 2023-02-05 NOTE — Assessment & Plan Note (Signed)
Not under good control. Due to see new psychiatrist in November. Call with any issues before then.

## 2023-02-06 LAB — COMPREHENSIVE METABOLIC PANEL
ALT: 37 IU/L (ref 0–44)
AST: 25 IU/L (ref 0–40)
Albumin: 4.6 g/dL (ref 4.1–5.1)
Alkaline Phosphatase: 81 IU/L (ref 44–121)
BUN/Creatinine Ratio: 7 — ABNORMAL LOW (ref 9–20)
BUN: 11 mg/dL (ref 6–20)
Bilirubin Total: 0.8 mg/dL (ref 0.0–1.2)
CO2: 21 mmol/L (ref 20–29)
Calcium: 9.7 mg/dL (ref 8.7–10.2)
Chloride: 104 mmol/L (ref 96–106)
Creatinine, Ser: 1.53 mg/dL — ABNORMAL HIGH (ref 0.76–1.27)
Globulin, Total: 2.2 g/dL (ref 1.5–4.5)
Glucose: 96 mg/dL (ref 70–99)
Potassium: 4.4 mmol/L (ref 3.5–5.2)
Sodium: 143 mmol/L (ref 134–144)
Total Protein: 6.8 g/dL (ref 6.0–8.5)
eGFR: 62 mL/min/{1.73_m2} (ref >59)

## 2023-02-06 LAB — CBC WITH DIFFERENTIAL/PLATELET
Basophils Absolute: 0 10*3/uL (ref 0.0–0.2)
Basos: 0 %
EOS (ABSOLUTE): 0.1 10*3/uL (ref 0.0–0.4)
Eos: 1 %
Hematocrit: 44.9 % (ref 37.5–51.0)
Hemoglobin: 15.1 g/dL (ref 13.0–17.7)
Immature Grans (Abs): 0 10*3/uL (ref 0.0–0.1)
Immature Granulocytes: 0 %
Lymphocytes Absolute: 1.2 10*3/uL (ref 0.7–3.1)
Lymphs: 20 %
MCH: 29.3 pg (ref 26.6–33.0)
MCHC: 33.6 g/dL (ref 31.5–35.7)
MCV: 87 fL (ref 79–97)
Monocytes Absolute: 0.4 10*3/uL (ref 0.1–0.9)
Monocytes: 6 %
Neutrophils Absolute: 4.2 10*3/uL (ref 1.4–7.0)
Neutrophils: 73 %
Platelets: 200 10*3/uL (ref 150–450)
RBC: 5.15 x10E6/uL (ref 4.14–5.80)
RDW: 12.7 % (ref 11.6–15.4)
WBC: 5.8 10*3/uL (ref 3.4–10.8)

## 2023-02-08 ENCOUNTER — Ambulatory Visit: Payer: BC Managed Care – PPO | Admitting: Urology

## 2023-02-08 ENCOUNTER — Encounter: Payer: Self-pay | Admitting: Urology

## 2023-02-08 VITALS — BP 143/103 | HR 105 | Ht 72.0 in | Wt 278.0 lb

## 2023-02-08 DIAGNOSIS — N1330 Unspecified hydronephrosis: Secondary | ICD-10-CM | POA: Diagnosis not present

## 2023-02-08 DIAGNOSIS — Z87442 Personal history of urinary calculi: Secondary | ICD-10-CM | POA: Diagnosis not present

## 2023-02-08 DIAGNOSIS — N201 Calculus of ureter: Secondary | ICD-10-CM

## 2023-02-08 DIAGNOSIS — N200 Calculus of kidney: Secondary | ICD-10-CM

## 2023-02-08 DIAGNOSIS — N132 Hydronephrosis with renal and ureteral calculous obstruction: Secondary | ICD-10-CM

## 2023-02-08 DIAGNOSIS — N23 Unspecified renal colic: Secondary | ICD-10-CM

## 2023-02-08 LAB — URINALYSIS, COMPLETE
Bilirubin, UA: NEGATIVE
Glucose, UA: NEGATIVE
Ketones, UA: NEGATIVE
Nitrite, UA: NEGATIVE
Protein,UA: NEGATIVE
RBC, UA: NEGATIVE
Specific Gravity, UA: 1.015 (ref 1.005–1.030)
Urobilinogen, Ur: 0.2 mg/dL (ref 0.2–1.0)
pH, UA: 7.5 (ref 5.0–7.5)

## 2023-02-08 LAB — MICROSCOPIC EXAMINATION

## 2023-02-08 MED ORDER — HYDROCODONE-ACETAMINOPHEN 5-325 MG PO TABS
1.0000 | ORAL_TABLET | Freq: Four times a day (QID) | ORAL | 0 refills | Status: DC | PRN
Start: 2023-02-08 — End: 2023-08-06

## 2023-02-08 NOTE — Progress Notes (Signed)
I, Jacob [name], acting as a scribe for Jacob Altes, MD., have documented all relevant documentation on the behalf of Jacob Altes, MD, as directed by Jacob Altes, MD while in the presence of Jacob Altes, MD.  02/08/2023 2:32 PM   Jacob [name] 05-Apr-1991 [id number]  Referring provider: Dorcas Carrow, DO [address] [place],  [place] [address]  Chief Complaint  Patient presents with   Other    HPI: Jacob [name] is a 32 y.o. male referred for evaluation of a ureteral calculus.   For the last 18 months has had intermittent right flank pain, which can be severe at times. No fever, chills, nausea, or vomiting. He had a renal stone CT 07/29/21 which showed bilateral non-obstructing renal calculi in an 8 mm right renal pelvic calculus, potentially causing intermittent obstruction. He was given Rx tramadol and tamsulosin. A recent follow-up visit was complaining of lower abdominal pain and follow-up CT was performed which showed migration of the 8 mm calculus to the right mid/distal ureter with moderate hydronephrosis/hydroureter. He has a nonobstructing left lower pole calculus and nonobstructing right lower pole calculi measuring up to 9 mm.   PMH: Past Medical History:  Diagnosis Date   Anxiety    Depression     Surgical History: Past Surgical History:  Procedure Laterality Date   COLONOSCOPY WITH PROPOFOL N/A 06/04/2021   Procedure: COLONOSCOPY WITH PROPOFOL;  Surgeon: Toney Reil, MD;  Location: [facility] ENDOSCOPY;  Service: Gastroenterology;  Laterality: N/A;   ESOPHAGOGASTRODUODENOSCOPY (EGD) WITH PROPOFOL N/A 09/11/2020   Procedure: ESOPHAGOGASTRODUODENOSCOPY (EGD) WITH PROPOFOL;  Surgeon: Toney Reil, MD;  Location: [facility] ENDOSCOPY;  Service: Gastroenterology;  Laterality: N/A;   FLEXIBLE SIGMOIDOSCOPY N/A 09/11/2020   Procedure: FLEXIBLE SIGMOIDOSCOPY;  Surgeon: Toney Reil, MD;  Location: [facility] ENDOSCOPY;  Service: Gastroenterology;  Laterality:  N/A;    Home Medications:  Allergies as of 02/08/2023       Reactions   Lexapro [escitalopram]    Made patient dizzy   Wellbutrin [bupropion] Other (See Comments)   Brain fog        Medication List        Accurate as of February 08, 2023  2:32 PM. If you have any questions, ask your nurse or doctor.          HYDROcodone-acetaminophen 5-325 MG tablet Commonly known as: NORCO/VICODIN Take 1 tablet by mouth every 6 (six) hours as needed for moderate pain. Started by: Jacob [name]   lisinopril 10 MG tablet Commonly known as: ZESTRIL Take 1 tablet (10 mg total) by mouth daily.   LORazepam 0.5 MG tablet Commonly known as: ATIVAN Take 1 tablet (0.5 mg total) by mouth 2 (two) times daily as needed for anxiety.   metoprolol succinate 25 MG 24 hr tablet Commonly known as: TOPROL-XL TAKE 1/2 TABLET BY MOUTH EVERY DAY   omeprazole 20 MG capsule Commonly known as: PRILOSEC Take 1 capsule (20 mg total) by mouth daily.        Allergies:  Allergies  Allergen Reactions   Lexapro [Escitalopram]     Made patient dizzy   Wellbutrin [Bupropion] Other (See Comments)    Brain fog    Family History: Family History  Problem Relation Age of Onset   ADD / ADHD Mother    Diabetes Mother    Early death Father    Autoimmune disease Father    Autoimmune disease Sister    Early death Brother  Autoimmune disease Brother    ADD / ADHD Maternal Grandmother    Autism spectrum disorder Half-Brother     Social History:  reports that he has never smoked. He has never used smokeless tobacco. He reports current alcohol use of about 1.0 standard drink of alcohol per week. He reports current drug use. Drug: Marijuana.   Physical Exam: BP (!) 143/103   Pulse (!) 105   Ht 6' (1.829 m)   Wt 278 lb (126.1 kg)   BMI 37.70 kg/m   Constitutional:  Alert and oriented, No acute distress. HEENT: [place] AT, moist mucus membranes.  Trachea midline, no masses. Cardiovascular: No  clubbing, cyanosis, or edema. Respiratory: Normal respiratory effort, no increased work of breathing. GI: Abdomen is soft, nontender, nondistended, no abdominal masses Skin: No rashes, bruises or suspicious lesions. Neurologic: Grossly intact, no focal deficits, moving all 4 extremities. Psychiatric: Normal mood and affect.   Pertinent Imaging: CT images from 07/29/21 and 02/05/23 were personally reviewed and interpreted. Stone density 1300 HU with SSD of approximately 12 cm   CT EXAM: CT ABDOMEN AND PELVIS WITH CONTRAST   TECHNIQUE: Multidetector CT imaging of the abdomen and pelvis was performed using the standard protocol following bolus administration of intravenous contrast.   RADIATION DOSE REDUCTION: This exam was performed according to the departmental dose-optimization program which includes automated exposure control, adjustment of the mA and/or kV according to patient size and/or use of iterative reconstruction technique.   CONTRAST:  OMNIPAQUE IOHEXOL 350 MG/ML SOLN   COMPARISON:  Renal stone protocol CT 07/29/21   FINDINGS: Lower chest: Lung bases are clear.   Hepatobiliary: Liver has a normal contour. No focal liver lesions are visualized. No perihepatic fluid. No evidence of intra or extrahepatic biliary ductal dilatation. Portal veins are contrast opacify.   Pancreas: No evidence of peripancreatic fat stranding suggest pancreatitis. No evidence of pancreatic ductal dilatation.   Spleen: Normal in size without focal abnormality.   Adrenals/Urinary Tract: Bilateral adrenal glands are normal in size. The left kidney enhance homogeneously without focal lesions. There is a 5 mm nonobstructing renal stone at the lower pole of the left kidney. Right kidney demonstrates a delayed nephrogram with moderate to severe right-sided hydronephrosis. There is an obstructing 7 mm right distal ureteral calculus (series 2, image 68). There are 2 additional nonobstructing  renal stones in the lower pole of the right kidney measuring up to 9 mm (series 4, image 97). The urinary bladder is fluid-filled without focal abnormalities.   Stomach/Bowel: No evidence of bowel obstruction. There is diverticulosis without evidence of diverticulitis. The appendix is normal in appearance.   Vascular/Lymphatic: No significant vascular findings are present. No enlarged abdominal or pelvic lymph nodes.   Reproductive: Prostate is unremarkable.   Other: No abdominal wall hernia or abnormality. No abdominopelvic ascites.   Musculoskeletal: No acute or significant osseous findings.   IMPRESSION: 1. Moderate to severe right-sided hydronephrosis secondary to an obstructing 7 mm right distal ureteral calculus. 2. Additional nonobstructing renal stones in the lower pole of the right kidney measuring up to 9 mm. 3. Nonobstructing 5 mm renal stone at the lower pole of the left kidney. 4. Diverticulosis without evidence of diverticulitis.     Electronically Signed   By: Lorenza Cambridge M.D.   On: 02/05/2023 14:03   CT RENAL STONE STUDY  Narrative CLINICAL DATA:  Flank pain, hematuria  EXAM: CT ABDOMEN AND PELVIS WITHOUT CONTRAST  TECHNIQUE: Multidetector CT imaging of the abdomen and pelvis  was performed following the standard protocol without IV contrast.  RADIATION DOSE REDUCTION: This exam was performed according to the departmental dose-optimization program which includes automated exposure control, adjustment of the mA and/or kV according to patient size and/or use of iterative reconstruction technique.  COMPARISON:  None.  FINDINGS: Lower chest: Unremarkable.  Hepatobiliary: No focal abnormality is seen in the liver. Gallbladder is unremarkable.  Pancreas: No focal abnormality is seen.  Spleen: Spleen measures 15.8 cm in maximum diameter.  Adrenals/Urinary Tract: Adrenals are unremarkable. There is no hydronephrosis in the left kidney. There is  mild prominence of collecting system in the right kidney. There are multiple bilateral renal stones, more so in the right kidney. Is 14 mm calculus in the lower pole of right kidney. There is 8 mm calculus in the right renal pelvis. There are no calcific densities in the courses of the ureters. Urinary bladder is unremarkable.  Stomach/Bowel: Stomach is not distended. Small bowel loops are not dilated. Appendix is not dilated. There is no significant wall thickening in colon. There is no pericolic stranding.  Vascular/Lymphatic: Unremarkable.  Reproductive: Unremarkable.  Other: There is no ascites or pneumoperitoneum. Small umbilical hernia containing fat is seen. Small bilateral inguinal hernias containing fat are seen.  Musculoskeletal: Small sclerotic density seen in the head of the left femur may suggest benign bone island.  IMPRESSION: Multiple bilateral renal stones, more so in the right kidney. There is mild dilation of collecting system in the right kidney. There is 8 mm calculus in the right renal pelvis which may be causing intermittent obstruction of the ureteropelvic junction.  There is no evidence of intestinal obstruction or pneumoperitoneum. Appendix is not dilated. Enlarged spleen.  Other findings as described in the body of the report.   Electronically Signed By: Ernie Avena M.D. On: 07/29/2021 10:14   Assessment & Plan:    1. Right ureteral calculus Associated with moderate hydronephrosis and upstream hydroyeritor.  We discussed various treatment options for urolithiasis including observation with or without medical expulsive therapy, shockwave lithotripsy (SWL), ureteroscopy and laser lithotripsy with stent placement, and percutaneous nephrolithotomy. We discussed that management is based on stone size, location, density, patient co-morbidities, and patient preference.  Stones <44mm in size have a >80% spontaneous passage rate. Data surrounding  the use of tamsulosin for medical expulsive therapy is controversial, but meta analyses suggests it is most efficacious for distal stones between 5-28mm in size. Possible side effects include dizziness/lightheadedness, and retrograde ejaculation. SWL has a lower stone free rate in a single procedure, but also a lower complication rate compared to ureteroscopy and avoids a stent and associated stent related symptoms. Possible complications include renal hematoma, steinstrasse, and need for additional treatment. Ureteroscopy with laser lithotripsy and stent placement has a higher stone free rate than SWL in a single procedure, however increased complication rate including possible infection, ureteral injury, bleeding, and stent related morbidity. Common stent related symptoms include dysuria, urgency/frequency, and flank pain. PCNL is the favored treatment for stones >2cm. It involves a small incision in the flank, with complete fragmentation of stones and removal. It has the highest stone free rate, but also the highest complication rate. Possible complications include bleeding, infection/sepsis, injury to surrounding organs including the pleura, and collecting system injury.  After an extensive discussion of the risks and benefits of the above treatment options, the patient would like to proceed with ureteroscopic removal.  Rx hydrocodone sent to pharmacy; continued. tamsulosin.  2. Bilateral nephrolithiasis Plan on treatment of  his right lower pole calculi at the time of his distal calculus treatment. Recommend metabolic evaluation once current stone episode resolved.  [facility] [address], [address] [place], [place] [address] [phone]

## 2023-02-08 NOTE — H&P (View-Only) (Signed)
I, Jacob [name], acting as a scribe for Riki Altes, MD., have documented all relevant documentation on the behalf of Riki Altes, MD, as directed by Riki Altes, MD while in the presence of Riki Altes, MD.  02/08/2023 2:32 PM   Jacob [name] 05-Apr-1991 [id number]  Referring provider: Dorcas Carrow, DO [address] [place],  [place] [address]  Chief Complaint  Patient presents with   Other    HPI: Jacob [name] is a 32 y.o. male referred for evaluation of a ureteral calculus.   For the last 18 months has had intermittent right flank pain, which can be severe at times. No fever, chills, nausea, or vomiting. He had a renal stone CT 07/29/21 which showed bilateral non-obstructing renal calculi in an 8 mm right renal pelvic calculus, potentially causing intermittent obstruction. He was given Rx tramadol and tamsulosin. A recent follow-up visit was complaining of lower abdominal pain and follow-up CT was performed which showed migration of the 8 mm calculus to the right mid/distal ureter with moderate hydronephrosis/hydroureter. He has a nonobstructing left lower pole calculus and nonobstructing right lower pole calculi measuring up to 9 mm.   PMH: Past Medical History:  Diagnosis Date   Anxiety    Depression     Surgical History: Past Surgical History:  Procedure Laterality Date   COLONOSCOPY WITH PROPOFOL N/A 06/04/2021   Procedure: COLONOSCOPY WITH PROPOFOL;  Surgeon: Toney Reil, MD;  Location: [facility] ENDOSCOPY;  Service: Gastroenterology;  Laterality: N/A;   ESOPHAGOGASTRODUODENOSCOPY (EGD) WITH PROPOFOL N/A 09/11/2020   Procedure: ESOPHAGOGASTRODUODENOSCOPY (EGD) WITH PROPOFOL;  Surgeon: Toney Reil, MD;  Location: [facility] ENDOSCOPY;  Service: Gastroenterology;  Laterality: N/A;   FLEXIBLE SIGMOIDOSCOPY N/A 09/11/2020   Procedure: FLEXIBLE SIGMOIDOSCOPY;  Surgeon: Toney Reil, MD;  Location: [facility] ENDOSCOPY;  Service: Gastroenterology;  Laterality:  N/A;    Home Medications:  Allergies as of 02/08/2023       Reactions   Lexapro [escitalopram]    Made patient dizzy   Wellbutrin [bupropion] Other (See Comments)   Brain fog        Medication List        Accurate as of February 08, 2023  2:32 PM. If you have any questions, ask your nurse or doctor.          HYDROcodone-acetaminophen 5-325 MG tablet Commonly known as: NORCO/VICODIN Take 1 tablet by mouth every 6 (six) hours as needed for moderate pain. Started by: Riki Altes   lisinopril 10 MG tablet Commonly known as: ZESTRIL Take 1 tablet (10 mg total) by mouth daily.   LORazepam 0.5 MG tablet Commonly known as: ATIVAN Take 1 tablet (0.5 mg total) by mouth 2 (two) times daily as needed for anxiety.   metoprolol succinate 25 MG 24 hr tablet Commonly known as: TOPROL-XL TAKE 1/2 TABLET BY MOUTH EVERY DAY   omeprazole 20 MG capsule Commonly known as: PRILOSEC Take 1 capsule (20 mg total) by mouth daily.        Allergies:  Allergies  Allergen Reactions   Lexapro [Escitalopram]     Made patient dizzy   Wellbutrin [Bupropion] Other (See Comments)    Brain fog    Family History: Family History  Problem Relation Age of Onset   ADD / ADHD Mother    Diabetes Mother    Early death Father    Autoimmune disease Father    Autoimmune disease Sister    Early death Brother  Autoimmune disease Brother    ADD / ADHD Maternal Grandmother    Autism spectrum disorder Half-Brother     Social History:  reports that he has never smoked. He has never used smokeless tobacco. He reports current alcohol use of about 1.0 standard drink of alcohol per week. He reports current drug use. Drug: Marijuana.   Physical Exam: BP (!) 143/103   Pulse (!) 105   Ht 6' (1.829 m)   Wt 278 lb (126.1 kg)   BMI 37.70 kg/m   Constitutional:  Alert and oriented, No acute distress. HEENT: [place] AT, moist mucus membranes.  Trachea midline, no masses. Cardiovascular: No  clubbing, cyanosis, or edema. Respiratory: Normal respiratory effort, no increased work of breathing. GI: Abdomen is soft, nontender, nondistended, no abdominal masses Skin: No rashes, bruises or suspicious lesions. Neurologic: Grossly intact, no focal deficits, moving all 4 extremities. Psychiatric: Normal mood and affect.   Pertinent Imaging: CT images from 07/29/21 and 02/05/23 were personally reviewed and interpreted. Stone density 1300 HU with SSD of approximately 12 cm   CT EXAM: CT ABDOMEN AND PELVIS WITH CONTRAST   TECHNIQUE: Multidetector CT imaging of the abdomen and pelvis was performed using the standard protocol following bolus administration of intravenous contrast.   RADIATION DOSE REDUCTION: This exam was performed according to the departmental dose-optimization program which includes automated exposure control, adjustment of the mA and/or kV according to patient size and/or use of iterative reconstruction technique.   CONTRAST:  OMNIPAQUE IOHEXOL 350 MG/ML SOLN   COMPARISON:  Renal stone protocol CT 07/29/21   FINDINGS: Lower chest: Lung bases are clear.   Hepatobiliary: Liver has a normal contour. No focal liver lesions are visualized. No perihepatic fluid. No evidence of intra or extrahepatic biliary ductal dilatation. Portal veins are contrast opacify.   Pancreas: No evidence of peripancreatic fat stranding suggest pancreatitis. No evidence of pancreatic ductal dilatation.   Spleen: Normal in size without focal abnormality.   Adrenals/Urinary Tract: Bilateral adrenal glands are normal in size. The left kidney enhance homogeneously without focal lesions. There is a 5 mm nonobstructing renal stone at the lower pole of the left kidney. Right kidney demonstrates a delayed nephrogram with moderate to severe right-sided hydronephrosis. There is an obstructing 7 mm right distal ureteral calculus (series 2, image 68). There are 2 additional nonobstructing  renal stones in the lower pole of the right kidney measuring up to 9 mm (series 4, image 97). The urinary bladder is fluid-filled without focal abnormalities.   Stomach/Bowel: No evidence of bowel obstruction. There is diverticulosis without evidence of diverticulitis. The appendix is normal in appearance.   Vascular/Lymphatic: No significant vascular findings are present. No enlarged abdominal or pelvic lymph nodes.   Reproductive: Prostate is unremarkable.   Other: No abdominal wall hernia or abnormality. No abdominopelvic ascites.   Musculoskeletal: No acute or significant osseous findings.   IMPRESSION: 1. Moderate to severe right-sided hydronephrosis secondary to an obstructing 7 mm right distal ureteral calculus. 2. Additional nonobstructing renal stones in the lower pole of the right kidney measuring up to 9 mm. 3. Nonobstructing 5 mm renal stone at the lower pole of the left kidney. 4. Diverticulosis without evidence of diverticulitis.     Electronically Signed   By: Lorenza Cambridge M.D.   On: 02/05/2023 14:03   CT RENAL STONE STUDY  Narrative CLINICAL DATA:  Flank pain, hematuria  EXAM: CT ABDOMEN AND PELVIS WITHOUT CONTRAST  TECHNIQUE: Multidetector CT imaging of the abdomen and pelvis  was performed following the standard protocol without IV contrast.  RADIATION DOSE REDUCTION: This exam was performed according to the departmental dose-optimization program which includes automated exposure control, adjustment of the mA and/or kV according to patient size and/or use of iterative reconstruction technique.  COMPARISON:  None.  FINDINGS: Lower chest: Unremarkable.  Hepatobiliary: No focal abnormality is seen in the liver. Gallbladder is unremarkable.  Pancreas: No focal abnormality is seen.  Spleen: Spleen measures 15.8 cm in maximum diameter.  Adrenals/Urinary Tract: Adrenals are unremarkable. There is no hydronephrosis in the left kidney. There is  mild prominence of collecting system in the right kidney. There are multiple bilateral renal stones, more so in the right kidney. Is 14 mm calculus in the lower pole of right kidney. There is 8 mm calculus in the right renal pelvis. There are no calcific densities in the courses of the ureters. Urinary bladder is unremarkable.  Stomach/Bowel: Stomach is not distended. Small bowel loops are not dilated. Appendix is not dilated. There is no significant wall thickening in colon. There is no pericolic stranding.  Vascular/Lymphatic: Unremarkable.  Reproductive: Unremarkable.  Other: There is no ascites or pneumoperitoneum. Small umbilical hernia containing fat is seen. Small bilateral inguinal hernias containing fat are seen.  Musculoskeletal: Small sclerotic density seen in the head of the left femur may suggest benign bone island.  IMPRESSION: Multiple bilateral renal stones, more so in the right kidney. There is mild dilation of collecting system in the right kidney. There is 8 mm calculus in the right renal pelvis which may be causing intermittent obstruction of the ureteropelvic junction.  There is no evidence of intestinal obstruction or pneumoperitoneum. Appendix is not dilated. Enlarged spleen.  Other findings as described in the body of the report.   Electronically Signed By: Ernie Avena M.D. On: 07/29/2021 10:14   Assessment & Plan:    1. Right ureteral calculus Associated with moderate hydronephrosis and upstream hydroyeritor.  We discussed various treatment options for urolithiasis including observation with or without medical expulsive therapy, shockwave lithotripsy (SWL), ureteroscopy and laser lithotripsy with stent placement, and percutaneous nephrolithotomy. We discussed that management is based on stone size, location, density, patient co-morbidities, and patient preference.  Stones <44mm in size have a >80% spontaneous passage rate. Data surrounding  the use of tamsulosin for medical expulsive therapy is controversial, but meta analyses suggests it is most efficacious for distal stones between 5-28mm in size. Possible side effects include dizziness/lightheadedness, and retrograde ejaculation. SWL has a lower stone free rate in a single procedure, but also a lower complication rate compared to ureteroscopy and avoids a stent and associated stent related symptoms. Possible complications include renal hematoma, steinstrasse, and need for additional treatment. Ureteroscopy with laser lithotripsy and stent placement has a higher stone free rate than SWL in a single procedure, however increased complication rate including possible infection, ureteral injury, bleeding, and stent related morbidity. Common stent related symptoms include dysuria, urgency/frequency, and flank pain. PCNL is the favored treatment for stones >2cm. It involves a small incision in the flank, with complete fragmentation of stones and removal. It has the highest stone free rate, but also the highest complication rate. Possible complications include bleeding, infection/sepsis, injury to surrounding organs including the pleura, and collecting system injury.  After an extensive discussion of the risks and benefits of the above treatment options, the patient would like to proceed with ureteroscopic removal.  Rx hydrocodone sent to pharmacy; continued. tamsulosin.  2. Bilateral nephrolithiasis Plan on treatment of  his right lower pole calculi at the time of his distal calculus treatment. Recommend metabolic evaluation once current stone episode resolved.  [facility] [address], [address] [place], [place] [address] [phone]

## 2023-02-09 ENCOUNTER — Encounter: Payer: Self-pay | Admitting: Urology

## 2023-02-11 ENCOUNTER — Other Ambulatory Visit: Payer: Self-pay | Admitting: Urology

## 2023-02-11 ENCOUNTER — Telehealth: Payer: Self-pay

## 2023-02-11 ENCOUNTER — Encounter: Payer: Self-pay | Admitting: Urology

## 2023-02-11 ENCOUNTER — Other Ambulatory Visit: Payer: Self-pay

## 2023-02-11 DIAGNOSIS — N201 Calculus of ureter: Secondary | ICD-10-CM

## 2023-02-11 DIAGNOSIS — N200 Calculus of kidney: Secondary | ICD-10-CM

## 2023-02-11 LAB — CULTURE, URINE COMPREHENSIVE

## 2023-02-11 NOTE — Progress Notes (Signed)
Surgical Physician Order Form [facility]  Dr. Irineo Axon, MD  * Scheduling expectation : Next Available  *Length of Case: 90 min  *Clearance needed: no  *Anticoagulation Instructions: N/A  *Aspirin Instructions: N/A  *Post-op visit Date/Instructions:  1 week cysto stent removal  *Diagnosis: Right ureteral calculus/right nephrolithiasis  *Procedure: Right Ureteroscopy w/laser lithotripsy & stent placement (19147)   Additional orders: N/A  -Admit type: OUTpatient  -Anesthesia: General  -VTE Prophylaxis Standing Order SCD's       Other:   -Standing Lab Orders Per Anesthesia    Lab other: UA&Urine Culture-ordered 02/08/2023  -Standing Test orders EKG/Chest x-ray per Anesthesia       Test other:   - Medications:  Ancef 2gm IV  -Other orders:  N/A

## 2023-02-11 NOTE — Telephone Encounter (Signed)
Per Dr. Lonna Cobb, Patient is to be scheduled for Right Ureteroscopy with Laser Lithotripsy and Stent Placement   Mr. Jacob [name] was contacted and possible surgical dates were discussed, Tuesday September 24th, 2024 was agreed upon for surgery.   Patient was directed to call [phone] between 1-3pm the day before surgery to find out surgical arrival time.  Instructions were given not to eat or drink from midnight on the night before surgery and have a driver for the day of surgery. On the surgery day patient was instructed to enter through the Medical Mall entrance of [facility] report the Same Day Surgery desk.   Pre-Admit Testing will be in contact via phone to set up an interview with the anesthesia team to review your history and medications prior to surgery.   Reminder of this information was sent via MyChart to the patient.

## 2023-02-11 NOTE — Progress Notes (Signed)
   [facility] Posting Form  Surgery Date: Date: 02/16/2023  Surgeon: Dr. Irineo Axon, MD  Inpt ( No  )   Outpt (Yes)   Obs ( No  )   Diagnosis: N20.1 Right Ureteral Stone, N20.0 Right Nephrolithiasis  -CPT: [phone]  Surgery: Right Ureteroscopy with Laser Lithotripsy and Stent Placement  Stop Anticoagulations: No  Cardiac/Medical/Pulmonary Clearance needed: no  *Orders entered into EPIC  Date: 02/11/23   *Case booked in EPIC  Date: 02/11/23  *Notified pt of Surgery: Date: 02/11/23  PRE-OP UA & CX: yes, ordered in 02/08/2023  *Placed into Prior Authorization Work Angela Nevin Date: 02/11/23  Assistant/laser/rep:No

## 2023-02-12 ENCOUNTER — Encounter
Admission: RE | Admit: 2023-02-12 | Discharge: 2023-02-12 | Disposition: A | Discharge status: Home / Self Care | Payer: BC Managed Care – PPO | Source: Ambulatory Visit | Attending: Urology | Admitting: Urology

## 2023-02-12 ENCOUNTER — Other Ambulatory Visit: Payer: Self-pay

## 2023-02-12 DIAGNOSIS — Z8616 Personal history of COVID-19: Secondary | ICD-10-CM

## 2023-02-12 HISTORY — DX: Gastro-esophageal reflux disease without esophagitis: K21.9

## 2023-02-12 HISTORY — DX: Essential (primary) hypertension: I10

## 2023-02-12 HISTORY — DX: Personal history of urinary calculi: Z87.442

## 2023-02-12 HISTORY — DX: Personal history of COVID-19: Z86.16

## 2023-02-12 NOTE — Pre-Procedure Instructions (Signed)
Patient stated he was having cold symptoms last 4-5 days so I requested he take a Covid test and call us back with result whether positive or negative. Patient states his test came back positive for Covid. Notified Staff at urology office.

## 2023-02-12 NOTE — Patient Instructions (Addendum)
Your procedure is scheduled on: Tuesday 02/16/23 To find out your arrival time, please call [phone] between 1PM - 3PM on:  Monday 02/15/23  Report to the Registration Desk on the 1st floor of the Medical Mall. FREE Valet parking is available.  If your arrival time is 6:00 am, do not arrive before that time as the Medical Mall entrance doors do not open until 6:00 am.  REMEMBER: Instructions that are not followed completely may result in serious medical risk, up to and including death; or upon the discretion of your surgeon and anesthesiologist your surgery may need to be rescheduled.  Do not eat food or drink any liquids after midnight the night before surgery.  No gum chewing or hard candies.  One week prior to surgery: Stop Anti-inflammatories (NSAIDS) such as Advil, Aleve, Ibuprofen, Motrin, Naproxen, Naprosyn and Aspirin based products such as Excedrin, Goody's Powder, BC Powder. You may however, continue to take Tylenol if needed for pain up until the day of surgery.  Stop ANY OVER THE COUNTER supplements or vitamins until after surgery.  Continue taking all prescribed medications.   TAKE ONLY THESE MEDICATIONS THE MORNING OF SURGERY WITH A SIP OF WATER:  metoprolol succinate (TOPROL-XL) 25 MG 24 hr tablet  omeprazole (PRILOSEC) 20 MG capsule Antacid (take one the night before and one on the morning of surgery - helps to prevent nausea after surgery.) tamsulosin (FLOMAX) 0.4 MG CAPS capsule  May take Lorazepam/Ativan if needed for anxiety  No Alcohol for 24 hours before or after surgery.  No Smoking including e-cigarettes for 24 hours before surgery.  No chewable tobacco products for at least 6 hours before surgery.  No nicotine patches on the day of surgery.  Do not use any "recreational" drugs for at least a week (preferably 2 weeks) before your surgery.  Please be advised that the combination of cocaine and anesthesia may have negative outcomes, up to and including  death. If you test positive for cocaine, your surgery will be cancelled.  On the morning of surgery brush your teeth with toothpaste and water, you may rinse your mouth with mouthwash if you wish. Do not swallow any toothpaste or mouthwash.  Use CHG Soap or wipes as directed on instruction sheet. Shower with your regular soap.  Do not wear lotions, powders, or perfumes.   Do not shave body hair from the neck down 48 hours before surgery.  Wear comfortable clothing (specific to your surgery type) to the hospital.  Do not wear jewelry, make-up, hairpins, clips or nail polish.  For welded (permanent) jewelry: bracelets, anklets, waist bands, etc.  Please have this removed prior to surgery.  If it is not removed, there is a chance that hospital personnel will need to cut it off on the day of surgery. Contact lenses, hearing aids and dentures may not be worn into surgery.  Do not bring valuables to the hospital. [facility] is not responsible for any missing/lost belongings or valuables.   Notify your doctor if there is any change in your medical condition (cold, fever, infection).  If you are being discharged the day of surgery, you will not be allowed to drive home. You will need a responsible individual to drive you home and stay with you for 24 hours after surgery.   If you are taking public transportation, you will need to have a responsible individual with you.  If you are being admitted to the hospital overnight, leave your suitcase in the car. After surgery  it may be brought to your room.  In case of increased patient census, it may be necessary for you, the patient, to continue your postoperative care in the Same Day Surgery department.  After surgery, you can help prevent lung complications by doing breathing exercises.  Take deep breaths and cough every 1-2 hours. Your doctor may order a device called an Incentive Spirometer to help you take deep breaths. When coughing or  sneezing, hold a pillow firmly against your incision with both hands. This is called "splinting." Doing this helps protect your incision. It also decreases belly discomfort.  Surgery Visitation Policy:  Patients undergoing a surgery or procedure may have two family members or support persons with them as long as the person is not COVID-19 positive or experiencing its symptoms.   Inpatient Visitation:    Visiting hours are 7 a.m. to 8 p.m. Up to four visitors are allowed at one time in a patient room. The visitors may rotate out with other people during the day. One designated support person (adult) may remain overnight.  Please call the Pre-admissions Testing Dept. at [phone] if you have any questions about these instructions.

## 2023-02-12 NOTE — Progress Notes (Signed)
  Perioperative Services Pre-Admission/Anesthesia Testing    Date: 02/12/23  Name: Jacob [name] MRN:   [id number]  Re: (+) SARS-CoV-2 testing  Planned Surgical Procedure(s):    Case: [id number] Date/Time: 02/16/2023   Procedure: CYSTOSCOPY/URETEROSCOPY/HOLMIUM LASER/STENT PLACEMENT (Right)   Anesthesia type: General   Pre-op diagnosis: Right Ureteral Stone, Right Nephrolithiasis   Location: [facility] OR ROOM 10 / [facility] ORS FOR ANESTHESIA [facility]   Surgeons: Riki Altes, MD   Clinical Notes:  Patient is scheduled for the above procedure on 02/16/2023 with Dr. Irineo Axon, MD.  During PAT interview call, patient with complaints of "cold symptoms" x 4-5 days. Patient with nasal congestion, cough, and elevated temperature. He is unsure of his Tmax.   Given upcoming surgery, patient was directed to purchase a home SARS-CoV-2 test and return a call to PAT with the results. Home rapid Ag testing performed and resulted as (+). Elective surgery with urology will need to be postponed being resolution of symptoms.Patient instructed on supportive care and symptoms that would deem escalation of care with PCP, UCC, or the ED.   Impression and Plan:  Jacob [name] scheduled for elective urology procedure on 02/16/2023.  Patient with symptom constellation concerning for SARS-CoV-2.  Home Ag testing found to be positive.  Supportive care reviewed.  Patient aware of symptoms that would necessitate further evaluation.  Case scheduled for next week will need to be postponed for 10 days.  Surgery can be rescheduled on day 11 if patient is asymptomatic.  In the event that patient continues to be symptomatic on day 11, then case will need to be pushed out to day 21.  Copy of note sent to surgery scheduler and primary urologist to make them aware.  No other needs from PAT at this time.  Quentin Mulling, MSN, APRN, FNP-C, C[facility]  Perioperative Services Nurse Practitioner Phone: [phone] Fax: [phone] 02/12/23 2:30 PM  NOTE: This note has been prepared using Dragon dictation software. Despite my best ability to proofread, there is always the potential that unintentional transcriptional errors may still occur from this process.

## 2023-02-22 ENCOUNTER — Encounter
Admission: RE | Admit: 2023-02-22 | Discharge: 2023-02-22 | Disposition: A | Discharge status: Home / Self Care | Payer: BC Managed Care – PPO | Source: Ambulatory Visit | Attending: Urology | Admitting: Urology

## 2023-02-22 NOTE — Pre-Procedure Instructions (Signed)
Patient reports that Covid symptoms have improved and he tested negative with a home test today. Medications, Hx and allergies reviewed.

## 2023-02-23 ENCOUNTER — Ambulatory Visit: Payer: BC Managed Care – PPO

## 2023-02-23 ENCOUNTER — Ambulatory Visit: Payer: BC Managed Care – PPO | Admitting: Urgent Care

## 2023-02-23 ENCOUNTER — Ambulatory Visit
Admission: RE | Admit: 2023-02-23 | Discharge: 2023-02-23 | Disposition: A | Discharge status: Home / Self Care | Payer: BC Managed Care – PPO | Attending: Urology | Admitting: Urology

## 2023-02-23 ENCOUNTER — Encounter: Payer: Self-pay | Admitting: Urology

## 2023-02-23 ENCOUNTER — Other Ambulatory Visit: Payer: Self-pay

## 2023-02-23 ENCOUNTER — Encounter
Admission: RE | Disposition: A | Discharge status: Home / Self Care | Payer: Self-pay | Source: Home / Self Care | Attending: Urology

## 2023-02-23 DIAGNOSIS — F419 Anxiety disorder, unspecified: Secondary | ICD-10-CM | POA: Diagnosis not present

## 2023-02-23 DIAGNOSIS — N200 Calculus of kidney: Secondary | ICD-10-CM

## 2023-02-23 DIAGNOSIS — Z87442 Personal history of urinary calculi: Secondary | ICD-10-CM | POA: Diagnosis not present

## 2023-02-23 DIAGNOSIS — N132 Hydronephrosis with renal and ureteral calculous obstruction: Secondary | ICD-10-CM | POA: Diagnosis not present

## 2023-02-23 DIAGNOSIS — N202 Calculus of kidney with calculus of ureter: Secondary | ICD-10-CM

## 2023-02-23 DIAGNOSIS — F32A Depression, unspecified: Secondary | ICD-10-CM | POA: Diagnosis not present

## 2023-02-23 DIAGNOSIS — I10 Essential (primary) hypertension: Secondary | ICD-10-CM | POA: Insufficient documentation

## 2023-02-23 DIAGNOSIS — N201 Calculus of ureter: Secondary | ICD-10-CM

## 2023-02-23 DIAGNOSIS — K219 Gastro-esophageal reflux disease without esophagitis: Secondary | ICD-10-CM | POA: Insufficient documentation

## 2023-02-23 HISTORY — PX: CYSTOSCOPY/URETEROSCOPY/HOLMIUM LASER/STENT PLACEMENT: SHX6546

## 2023-02-23 SURGERY — CYSTOSCOPY/URETEROSCOPY/HOLMIUM LASER/STENT PLACEMENT
Anesthesia: General | Laterality: Right

## 2023-02-23 MED ORDER — ORAL CARE MOUTH RINSE: 15.0000 mL | Freq: Once | OROMUCOSAL | Status: AC

## 2023-02-23 MED ORDER — LIDOCAINE HCL (PF) 2 % IJ SOLN
INTRAMUSCULAR | Status: DC | PRN
Start: 2023-02-23 — End: 2023-02-23
  Administered 2023-02-23: 60 mg via INTRADERMAL

## 2023-02-23 MED ORDER — CHLORHEXIDINE GLUCONATE 0.12 % MT SOLN
15.0000 mL | Freq: Once | OROMUCOSAL | Status: AC
  Administered 2023-02-23: 15 mL via OROMUCOSAL

## 2023-02-23 MED ORDER — LACTATED RINGERS IV SOLN: INTRAVENOUS | Status: DC

## 2023-02-23 MED ORDER — PROPOFOL 10 MG/ML IV BOLUS
INTRAVENOUS | Status: DC | PRN
Start: 2023-02-23 — End: 2023-02-23
  Administered 2023-02-23: 200 mg via INTRAVENOUS

## 2023-02-23 MED ORDER — SODIUM CHLORIDE 0.9 % IR SOLN
Status: DC | PRN
  Administered 2023-02-23: 1
  Administered 2023-02-23: 1000 mL

## 2023-02-23 MED ORDER — OXYCODONE HCL 5 MG PO TABS
5.0000 mg | ORAL_TABLET | Freq: Once | ORAL | Status: AC
  Administered 2023-02-23: 5 mg via ORAL

## 2023-02-23 MED ORDER — CHLORHEXIDINE GLUCONATE 0.12 % MT SOLN
OROMUCOSAL | Status: AC
  Filled 2023-02-23: qty 15

## 2023-02-23 MED ORDER — MIDAZOLAM HCL 2 MG/2ML IJ SOLN
INTRAMUSCULAR | Status: AC
  Filled 2023-02-23: qty 2

## 2023-02-23 MED ORDER — SUGAMMADEX SODIUM 200 MG/2ML IV SOLN
INTRAVENOUS | Status: DC | PRN
  Administered 2023-02-23: 200 mg via INTRAVENOUS

## 2023-02-23 MED ORDER — DEXAMETHASONE SODIUM PHOSPHATE 10 MG/ML IJ SOLN
INTRAMUSCULAR | Status: DC | PRN
  Administered 2023-02-23: 5 mg via INTRAVENOUS

## 2023-02-23 MED ORDER — OXYBUTYNIN CHLORIDE 5 MG PO TABS: ORAL_TABLET | ORAL | 0 refills | Status: DC

## 2023-02-23 MED ORDER — ROCURONIUM BROMIDE 100 MG/10ML IV SOLN
INTRAVENOUS | Status: DC | PRN
  Administered 2023-02-23: 10 mg via INTRAVENOUS
  Administered 2023-02-23: 50 mg via INTRAVENOUS

## 2023-02-23 MED ORDER — FENTANYL CITRATE (PF) 100 MCG/2ML IJ SOLN
INTRAMUSCULAR | Status: AC
  Filled 2023-02-23: qty 2

## 2023-02-23 MED ORDER — DROPERIDOL 2.5 MG/ML IJ SOLN: 0.6250 mg | Freq: Once | INTRAMUSCULAR | Status: DC | PRN

## 2023-02-23 MED ORDER — CEFAZOLIN SODIUM-DEXTROSE 2-4 GM/100ML-% IV SOLN
2.0000 g | INTRAVENOUS | Status: AC
  Administered 2023-02-23: 2 g via INTRAVENOUS

## 2023-02-23 MED ORDER — CEFAZOLIN SODIUM-DEXTROSE 2-4 GM/100ML-% IV SOLN
INTRAVENOUS | Status: AC
  Filled 2023-02-23: qty 100

## 2023-02-23 MED ORDER — FENTANYL CITRATE (PF) 100 MCG/2ML IJ SOLN: 25.0000 ug | INTRAMUSCULAR | Status: DC | PRN

## 2023-02-23 MED ORDER — OXYBUTYNIN CHLORIDE 5 MG PO TABS
5.0000 mg | ORAL_TABLET | Freq: Three times a day (TID) | ORAL | Status: DC
  Administered 2023-02-23: 5 mg via ORAL

## 2023-02-23 MED ORDER — ONDANSETRON HCL 4 MG/2ML IJ SOLN
INTRAMUSCULAR | Status: DC | PRN
  Administered 2023-02-23: 4 mg via INTRAVENOUS

## 2023-02-23 MED ORDER — FENTANYL CITRATE (PF) 100 MCG/2ML IJ SOLN
INTRAMUSCULAR | Status: DC | PRN
Start: 2023-02-23 — End: 2023-02-23
  Administered 2023-02-23 (×4): 50 ug via INTRAVENOUS

## 2023-02-23 MED ORDER — MIDAZOLAM HCL 2 MG/2ML IJ SOLN
INTRAMUSCULAR | Status: DC | PRN
Start: 2023-02-23 — End: 2023-02-23
  Administered 2023-02-23: 4 mg via INTRAVENOUS

## 2023-02-23 MED ORDER — OXYCODONE HCL 5 MG PO TABS
ORAL_TABLET | ORAL | Status: AC
  Filled 2023-02-23: qty 1

## 2023-02-23 MED ORDER — OXYBUTYNIN CHLORIDE 5 MG PO TABS
ORAL_TABLET | ORAL | Status: AC
  Filled 2023-02-23: qty 1

## 2023-02-23 MED ORDER — IOHEXOL 180 MG/ML  SOLN
INTRAMUSCULAR | Status: DC | PRN
  Administered 2023-02-23: 10 mL

## 2023-02-23 SURGICAL SUPPLY — 31 items
BAG DRAIN SIEMENS DORNER NS (MISCELLANEOUS) ×1 IMPLANT
BAG DRN NS LF (MISCELLANEOUS) ×1
BAG PRESSURE INF REUSE 3000 (BAG) IMPLANT
BASKET LASER NITINOL 1.9FR (BASKET) IMPLANT
BASKET ZERO TIP 1.9FR (BASKET) IMPLANT
BRUSH SCRUB EZ 1% IODOPHOR (MISCELLANEOUS) ×1 IMPLANT
BSKT STON RTRVL 120 1.9FR (BASKET) ×1
BSKT STON RTRVL ZERO TP 1.9FR (BASKET) ×1
CATH URETL OPEN END 6X70 (CATHETERS) IMPLANT
DRAPE UTILITY 15X26 TOWEL STRL (DRAPES) ×1 IMPLANT
FIBER LASER MOSES 200 DFL (Laser) IMPLANT
GLOVE BIOGEL PI IND STRL 7.5 (GLOVE) ×1 IMPLANT
GLOVE SURG SYN 6.5 ES PF (GLOVE) ×1 IMPLANT
GLOVE SURG SYN 6.5 PF PI (GLOVE) IMPLANT
GOWN STRL REUS W/ TWL LRG LVL3 (GOWN DISPOSABLE) ×1 IMPLANT
GOWN STRL REUS W/ TWL XL LVL3 (GOWN DISPOSABLE) ×1 IMPLANT
GOWN STRL REUS W/TWL LRG LVL3 (GOWN DISPOSABLE) ×1
GOWN STRL REUS W/TWL XL LVL3 (GOWN DISPOSABLE) ×1
GUIDEWIRE STR DUAL SENSOR (WIRE) ×1 IMPLANT
IV NS IRRIG 3000ML ARTHROMATIC (IV SOLUTION) ×1 IMPLANT
KIT TURNOVER CYSTO (KITS) ×1 IMPLANT
PACK CYSTO AR (MISCELLANEOUS) ×1 IMPLANT
SET CYSTO W/LG BORE CLAMP LF (SET/KITS/TRAYS/PACK) ×1 IMPLANT
SHEATH NAVIGATOR HD 12/14X46 (SHEATH) IMPLANT
STENT URET 6FRX24 CONTOUR (STENTS) IMPLANT
STENT URET 6FRX26 CONTOUR (STENTS) IMPLANT
SURGILUBE 2OZ TUBE FLIPTOP (MISCELLANEOUS) ×1 IMPLANT
SYR TOOMEY IRRIG 70ML (MISCELLANEOUS) ×1
SYRINGE TOOMEY IRRIG 70ML (MISCELLANEOUS) IMPLANT
VALVE UROSEAL ADJ ENDO (VALVE) IMPLANT
WATER STERILE IRR 500ML POUR (IV SOLUTION) ×1 IMPLANT

## 2023-02-23 NOTE — Op Note (Signed)
Preoperative diagnosis:  Right mid ureteral calculus Right nephrolithiasis  Postoperative diagnosis:  Same  Procedure:  Cystoscopy Right ureteroscopy and stone removal Ureteroscopic laser lithotripsy Right ureteral stent placement (61F/26 cm) Right retrograde pyelography with interpretation  Surgeon: Lorin Picket C. Nihira Puello, M.D.  Anesthesia: General  Complications: None  Intraoperative findings:  Cystoscopy: Urethra normal in caliber stricture; prostate nonocclusive; bladder mucosa without erythema, solid or papillary lesions, UOs normal-appearing bilaterally Ureteropyeloscopy: None impacted calculus right mid ureter; nonobstructing lower calyceal calculi Right retrograde pyelography post procedure showed no filling defects, stone fragments or contrast extravasation  EBL: Minimal  Specimens: Calculus fragments for analysis   Indication: Jacob [name] is a 32 y.o. male with a long history of intermittent right flank pain.  CT 07/2021 with an 8 mm right UPJ calculus.  CT last month showed calculus in the right mid ureter as well as nonobstructing bilateral renal calculi.  After reviewing the management options for treatment, the patient elected to proceed with the above surgical procedure(s). We have discussed the potential benefits and risks of the procedure, side effects of the proposed treatment, the likelihood of the patient achieving the goals of the procedure, and any potential problems that might occur during the procedure or recuperation. Informed consent has been obtained.  Description of procedure:  The patient was taken to the operating room and general anesthesia was induced.  The patient was placed in the dorsal lithotomy position, prepped and draped in the usual sterile fashion, and preoperative antibiotics were administered. A preoperative time-out was performed.   A 21 French cystoscope was lubricated, inserted per urethra and advanced proximally with findings as  described above.    Attention was directed to the right ureteral orifice and a 0.038 Sensor wire was then advanced up the ureter into the renal pelvis under fluoroscopic guidance.  A 4.5 Fr semirigid ureteroscope was then advanced into the ureter next to the guidewire and the calculus was identified as scribed above.  The stone was then dusted with a 200 m Moses holmium laser fiber at a setting of 0.3 J/40 hz and a crease to 0.3/80 secondary to stone hardness.  Several larger fragments chipped off the stone during dusting and were then removed from the ureter with a zero tip nitinol basket.  Reinspection of the ureter revealed no remaining visible stones or fragments.   A second Sensor wire was placed followed by a removal of the semirigid ureteroscope.  A 12/24F Navigator ureteral access sheath was then inserted over the working wire and advanced in the urine on fluoroscopic guidance without difficulty.  A single channel digital flexible ureteroscope was then placed through the access sheath and advanced into the renal pelvis.  Pyeloscopy was performed with findings as described above.  The lower calyceal calculi were dusted at a setting of 0.3J/120 Hz.  A few larger fragments were removed with the 1.9 [place] nitinol basket.  Retrograde pyelogram was then performed through the ureteroscope and all calyces were examined.  No fragments larger than the tip of the laser fiber were identified.  The ureteroscope and access sheath were removed in tandem and no evidence of ureteral injury was identified.  A 6 FR/26 CM Contour ureteral stent was placed under fluoroscopic guidance.  The wire was then removed with an adequate stent curl noted in a midpole calyx as well as in the bladder.  The bladder was then emptied and the procedure ended.  The patient appeared to tolerate the procedure well and without complications.  After  anesthetic reversal the patient was transported to the PACU in stable condition.    Plan: Office follow-up scheduled 03/03/2023 for stent removal   Irineo Axon, MD

## 2023-02-23 NOTE — Anesthesia Preprocedure Evaluation (Signed)
Anesthesia Evaluation  Patient identified by MRN, date of birth, ID band Patient awake    Reviewed: Allergy & Precautions, NPO status , Patient's Chart, lab work & pertinent test results  History of Anesthesia Complications Negative for: history of anesthetic complications  Airway Mallampati: I   Neck ROM: Full    Dental no notable dental hx. (+) Dental Advidsory Given   Pulmonary neg shortness of breath, neg COPD, Recent URI  (Covid + 2 weeks ago), Resolved   Pulmonary exam normal breath sounds clear to auscultation       Cardiovascular Exercise Tolerance: Good hypertension, (-) angina (-) Past MI and (-) Cardiac Stents Normal cardiovascular exam(-) dysrhythmias (-) Valvular Problems/Murmurs Rhythm:Regular Rate:Normal  ECG 05/27/21: normal   Neuro/Psych  PSYCHIATRIC DISORDERS Anxiety Depression    Daily marijuana use    GI/Hepatic ,GERD  ,,  Endo/Other  negative endocrine ROS    Renal/GU negative Renal ROS     Musculoskeletal   Abdominal   Peds  Hematology negative hematology ROS (+)   Anesthesia Other Findings Past Medical History: No date: Anxiety 01/2023: COVID-19 No date: Depression No date: GERD (gastroesophageal reflux disease) 02/12/2023: History of 2019 novel coronavirus disease (COVID-19)     Comment:  a.) home rapid Ag testing (+) 02/12/2023 No date: History of kidney stones No date: Hypertension   Reproductive/Obstetrics                             Anesthesia Physical Anesthesia Plan  ASA: 2  Anesthesia Plan: General   Post-op Pain Management:    Induction: Intravenous  PONV Risk Score and Plan: 2 and Ondansetron, Dexamethasone, Midazolam and Treatment may vary due to age or medical condition  Airway Management Planned: Oral ETT  Additional Equipment:   Intra-op Plan:   Post-operative Plan: Extubation in OR  Informed Consent: I have reviewed the patients  History and Physical, chart, labs and discussed the procedure including the risks, benefits and alternatives for the proposed anesthesia with the patient or authorized representative who has indicated his/her understanding and acceptance.       Plan Discussed with: CRNA  Anesthesia Plan Comments: (LMA/GETA backup discussed.  Patient consented for risks of anesthesia including but not limited to:  - adverse reactions to medications - damage to eyes, teeth, lips or other oral mucosa - nerve damage due to positioning  - sore throat or hoarseness - damage to heart, brain, nerves, lungs, other parts of body or loss of life  Informed patient about role of CRNA in peri- and intra-operative care.  Patient voiced understanding.)        Anesthesia Quick Evaluation

## 2023-02-23 NOTE — Discharge Instructions (Addendum)
DISCHARGE INSTRUCTIONS FOR KIDNEY STONE/URETERAL STENT   MEDICATIONS:  1. Resume all your other meds from home.  2.  AZO (over-the-counter) can help with the burning/stinging when you urinate. 3.  Oxybutynin is for bladder/stent irritation.  Rx was sent to your pharmacy.  Tamsulosin will also help stent/bladder irritation  ACTIVITY:  1. May resume regular activities in 24 hours. 2. No driving while on narcotic pain medications  3. Drink plenty of water  4. Continue to walk at home - you can still get blood clots when you are at home, so keep active, but don't over do it.  5. May return to work/school tomorrow or when you feel ready    SIGNS/SYMPTOMS TO CALL:  Common postoperative symptoms include urinary frequency, urgency, bladder spasm and blood in the urine  Please call us if you have a fever greater than 101.5, uncontrolled nausea/vomiting, uncontrolled pain, dizziness, unable to urinate, excessively bloody urine, chest pain, shortness of breath, leg swelling, leg pain, or any other concerns or questions.   You can reach [place] at [phone].   FOLLOW-UP:  1. You have a follow-up appointment scheduled 03/03/2023 for stent removal   AMBULATORY SURGERY  DISCHARGE INSTRUCTIONS   The drugs that you were given will stay in your system until tomorrow so for the next 24 hours you should not:  Drive an automobile Make any legal decisions Drink any alcoholic beverage   You may resume regular meals tomorrow.  Today it is better to start with liquids and gradually work up to solid foods.  You may eat anything you prefer, but it is better to start with liquids, then soup and crackers, and gradually work up to solid foods.   Please notify your doctor immediately if you have any unusual bleeding, trouble breathing, redness and pain at the surgery site, drainage, fever, or pain not relieved by medication.    Additional Instructions:  Please contact your physician with any problems  or Same Day Surgery at [phone], Monday through Friday 6 am to 4 pm, or [place] at [facility] number at [phone].

## 2023-02-23 NOTE — Anesthesia Procedure Notes (Signed)
Procedure Name: Intubation Date/Time: 02/23/2023 10:59 AM  Performed by: Monico Hoar, CRNAPre-anesthesia Checklist: Patient identified, Patient being monitored, Timeout performed, Emergency Drugs available and Suction available Patient Re-evaluated:Patient Re-evaluated prior to induction Oxygen Delivery Method: Circle system utilized Preoxygenation: Pre-oxygenation with 100% oxygen Induction Type: IV induction Ventilation: Mask ventilation without difficulty Laryngoscope Size: McGraph and 4 Grade View: Grade I Tube type: Oral Tube size: 7.5 mm Number of attempts: 1 Airway Equipment and Method: Stylet Placement Confirmation: ETT inserted through vocal cords under direct vision, positive ETCO2 and breath sounds checked- equal and bilateral Secured at: 21 cm Tube secured with: Tape Dental Injury: Teeth and Oropharynx as per pre-operative assessment

## 2023-02-23 NOTE — Transfer of Care (Signed)
Immediate Anesthesia Transfer of Care Note  Patient: Jacob [name]  Procedure(s) Performed: CYSTOSCOPY/URETEROSCOPY/HOLMIUM LASER/STENT PLACEMENT (Right)  Patient Location: PACU  Anesthesia Type:General  Level of Consciousness: drowsy  Airway & Oxygen Therapy: Patient Spontanous Breathing and Patient connected to face mask oxygen  Post-op Assessment: Report given to RN and Post -op Vital signs reviewed and stable  Post vital signs: Reviewed and stable  Last Vitals:  Vitals Value Taken Time  BP 120/68 02/23/23 1303  Temp    Pulse 69 02/23/23 1304  Resp 15 02/23/23 1304  SpO2 100 % 02/23/23 1304  Vitals shown include unfiled device data.  Last Pain:  Vitals:   02/23/23 1011  TempSrc: Temporal         Complications: No notable events documented.

## 2023-02-23 NOTE — Interval H&P Note (Signed)
History and Physical Interval Note:  02/23/2023 10:38 AM  Jacob [name]  has presented today for surgery, with the diagnosis of Right Ureteral Stone, Right Nephrolithiasis.  The various methods of treatment have been discussed with the patient and family. After consideration of risks, benefits and other options for treatment, the patient has consented to  Procedure(s): CYSTOSCOPY/URETEROSCOPY/HOLMIUM LASER/STENT PLACEMENT (Right) as a surgical intervention.  The patient's history has been reviewed, patient examined, no change in status, stable for surgery.  I have reviewed the patient's chart and labs.  Questions were answered to the patient's satisfaction.    CV:RRR Lungs:clear  Riki Altes

## 2023-02-24 ENCOUNTER — Encounter: Payer: BC Managed Care – PPO | Admitting: Urology

## 2023-02-24 ENCOUNTER — Encounter: Payer: Self-pay | Admitting: Urology

## 2023-03-03 ENCOUNTER — Encounter: Payer: Self-pay | Admitting: Urology

## 2023-03-03 ENCOUNTER — Ambulatory Visit: Payer: BC Managed Care – PPO | Admitting: Urology

## 2023-03-03 VITALS — BP 130/80 | HR 74 | Ht 72.0 in | Wt 278.0 lb

## 2023-03-03 DIAGNOSIS — Z466 Encounter for fitting and adjustment of urinary device: Secondary | ICD-10-CM

## 2023-03-03 DIAGNOSIS — N201 Calculus of ureter: Secondary | ICD-10-CM

## 2023-03-03 LAB — URINALYSIS, COMPLETE
Bilirubin, UA: NEGATIVE
Glucose, UA: NEGATIVE
Ketones, UA: NEGATIVE
Nitrite, UA: NEGATIVE
Specific Gravity, UA: 1.02 (ref 1.005–1.030)
Urobilinogen, Ur: 0.2 mg/dL (ref 0.2–1.0)
pH, UA: 7.5 (ref 5.0–7.5)

## 2023-03-03 LAB — CALCULI, WITH PHOTOGRAPH (CLINICAL LAB)
Calcium Oxalate Dihydrate: 10 %
Calcium Oxalate Monohydrate: 70 %
Hydroxyapatite: 20 %
Weight Calculi: 18 mg

## 2023-03-03 LAB — MICROSCOPIC EXAMINATION: RBC, Urine: >30 /[HPF] — AB (ref 0–2)

## 2023-03-03 MED ORDER — SULFAMETHOXAZOLE-TRIMETHOPRIM 800-160 MG PO TABS
1.0000 | ORAL_TABLET | Freq: Once | ORAL | Status: AC
Start: 2023-03-03 — End: 2023-03-03
  Administered 2023-03-03: 1 via ORAL

## 2023-03-03 NOTE — Patient Instructions (Signed)

## 2023-03-03 NOTE — Progress Notes (Signed)
   Indications: Patient is 32 y.o., who is s/p ureteroscopic removal of right ureteral and renal calculi 02/23/2023.  No significant postop problems; moderate stent symptoms.  The patient is presenting today for stent removal.  Stone analysis pending  Procedure:  Flexible Cystoscopy with stent removal (16109)  Timeout was performed and the correct patient, procedure and participants were identified.    Description:  The patient was prepped and draped in the usual sterile fashion. Flexible cystosopy was performed.  The stent was visualized, grasped, and removed intact without difficulty. The patient tolerated the procedure well.  A single dose of oral antibiotics was given.  Complications:  None  Plan:  Call for fever/flank pain post stent removal Metabolic stone evaluation through [facility] 13-month follow-up with [facility]

## 2023-03-05 ENCOUNTER — Ambulatory Visit: Payer: BC Managed Care – PPO | Admitting: Family Medicine

## 2023-03-05 ENCOUNTER — Encounter: Payer: Self-pay | Admitting: Family Medicine

## 2023-03-05 ENCOUNTER — Encounter: Payer: Self-pay | Admitting: Urology

## 2023-03-05 VITALS — BP 126/77 | HR 114 | Ht 72.0 in | Wt 264.0 lb

## 2023-03-05 DIAGNOSIS — I10 Essential (primary) hypertension: Secondary | ICD-10-CM

## 2023-03-05 DIAGNOSIS — F331 Major depressive disorder, recurrent, moderate: Secondary | ICD-10-CM | POA: Diagnosis not present

## 2023-03-05 DIAGNOSIS — Z23 Encounter for immunization: Secondary | ICD-10-CM

## 2023-03-05 MED ORDER — LISINOPRIL 10 MG PO TABS: 10.0000 mg | ORAL_TABLET | Freq: Every day | ORAL | 1 refills | Status: DC

## 2023-03-05 NOTE — Assessment & Plan Note (Signed)
Stable. Feeling well. Seeing new psychiatrist in November. Call with any concerns.

## 2023-03-05 NOTE — Progress Notes (Signed)
BP 126/77   Pulse (!) 114   Ht 6' (1.829 m)   Wt 264 lb (119.7 kg)   SpO2 98%   BMI 35.80 kg/m    Subjective:    Patient ID: Jacob [name], male    DOB: 19-Aug-1990, 32 y.o.   MRN: [id number]  HPI: Nehamiah Himmelsbach is a 32 y.o. male  Chief Complaint  Patient presents with   Hypertension   Depression   DEPRESSION Mood status: stable Satisfied with current treatment?: yes Symptom severity: mild  Duration of current treatment : chronic Side effects: no Medication compliance: good compliance Psychotherapy/counseling: yes in the past Depressed mood: yes Anxious mood: yes Anhedonia: no Significant weight loss or gain: no Insomnia: no  Fatigue: yes Feelings of worthlessness or guilt: no Impaired concentration/indecisiveness: no Suicidal ideations: no Hopelessness: no Crying spells: no    03/05/2023    3:38 PM 02/05/2023    9:06 AM 08/05/2022    8:54 AM 07/07/2022   10:01 AM 06/26/2022   10:41 AM  Depression screen PHQ 2/9  Decreased Interest 0 2 0 3   Down, Depressed, Hopeless 0 2 1 1    PHQ - 2 Score 0 4 1 4    Altered sleeping 1 3 1  0   Tired, decreased energy 2 3 3 3    Change in appetite 2 2 0 0   Feeling bad or failure about yourself  0 0 2 1   Trouble concentrating 0 2 0 0   Moving slowly or fidgety/restless 0 0 0 0   Suicidal thoughts 0 0 0 0   PHQ-9 Score 5 14 7 8    Difficult doing work/chores Not difficult at all Very difficult Very difficult Very difficult      Information is confidential and restricted. Go to Review Flowsheets to unlock data.    HYPERTENSION  Hypertension status: controlled  Satisfied with current treatment? yes Duration of hypertension: chronic BP monitoring frequency:  a few times a month BP medication side effects:  no Medication compliance: excellent compliance Previous BP meds: metoprolol, lisinopril Aspirin: no Recurrent headaches: no Visual changes: no Palpitations: no Dyspnea: no Chest pain: no Lower extremity edema:  no Dizzy/lightheaded: no   Relevant past medical, surgical, family and social history reviewed and updated as indicated. Interim medical history since our last visit reviewed. Allergies and medications reviewed and updated.  Review of Systems  Constitutional: Negative.   Respiratory: Negative.    Cardiovascular: Negative.   Musculoskeletal: Negative.   Psychiatric/Behavioral: Negative.      Per HPI unless specifically indicated above     Objective:    BP 126/77   Pulse (!) 114   Ht 6' (1.829 m)   Wt 264 lb (119.7 kg)   SpO2 98%   BMI 35.80 kg/m   Wt Readings from Last 3 Encounters:  03/05/23 264 lb (119.7 kg)  03/03/23 278 lb (126.1 kg)  02/23/23 278 lb (126.1 kg)    Physical Exam Vitals and nursing note reviewed.  Constitutional:      General: He is not in acute distress.    Appearance: Normal appearance. He is obese. He is not ill-appearing, toxic-appearing or diaphoretic.  HENT:     Head: Normocephalic and atraumatic.     Right Ear: External ear normal.     Left Ear: External ear normal.     Nose: Nose normal.     Mouth/Throat:     Mouth: Mucous membranes are moist.     Pharynx: Oropharynx is clear.  Eyes:     General: No scleral icterus.       Right eye: No discharge.        Left eye: No discharge.     Extraocular Movements: Extraocular movements intact.     Conjunctiva/sclera: Conjunctivae normal.     Pupils: Pupils are equal, round, and reactive to light.  Cardiovascular:     Rate and Rhythm: Normal rate and regular rhythm.     Pulses: Normal pulses.     Heart sounds: Normal heart sounds. No murmur heard.    No friction rub. No gallop.  Pulmonary:     Effort: Pulmonary effort is normal. No respiratory distress.     Breath sounds: Normal breath sounds. No stridor. No wheezing, rhonchi or rales.  Chest:     Chest wall: No tenderness.  Musculoskeletal:        General: Normal range of motion.     Cervical back: Normal range of motion and neck supple.   Skin:    General: Skin is warm and dry.     Capillary Refill: Capillary refill takes less than 2 seconds.     Coloration: Skin is not jaundiced or pale.     Findings: No bruising, erythema, lesion or rash.  Neurological:     General: No focal deficit present.     Mental Status: He is alert and oriented to person, place, and time. Mental status is at baseline.  Psychiatric:        Mood and Affect: Mood normal.        Behavior: Behavior normal.        Thought Content: Thought content normal.        Judgment: Judgment normal.     Results for orders placed or performed in visit on 03/03/23  Microscopic Examination   Urine  Result Value Ref Range   WBC, UA 6-10 (A) 0 - 5 /hpf   RBC, Urine >30 (A) 0 - 2 /hpf   Epithelial Cells (non renal) 0-10 0 - 10 /hpf   Bacteria, UA Few None seen/Few  Urinalysis, Complete  Result Value Ref Range   Specific Gravity, UA 1.020 1.005 - 1.030   pH, UA 7.5 5.0 - 7.5   Color, UA Yellow Yellow   Appearance Ur Clear Clear   Leukocytes,UA 1+ (A) Negative   Protein,UA 2+ (A) Negative/Trace   Glucose, UA Negative Negative   Ketones, UA Negative Negative   RBC, UA 2+ (A) Negative   Bilirubin, UA Negative Negative   Urobilinogen, Ur 0.2 0.2 - 1.0 mg/dL   Nitrite, UA Negative Negative   Microscopic Examination See below:       Assessment & Plan:   Problem List Items Addressed This Visit       Cardiovascular and Mediastinum   Primary hypertension - Primary    Under good control on current regimen. Continue current regimen. Continue to monitor. Call with any concerns. Refills given. Labs drawn today.       Relevant Medications   lisinopril (ZESTRIL) 10 MG tablet   Other Relevant Orders   Basic metabolic panel     Other   Depression    Stable. Feeling well. Seeing new psychiatrist in November. Call with any concerns.       Other Visit Diagnoses     Need for COVID-19 vaccine       COVID shot given today.   Relevant Orders   Pfizer  Comirnaty Covid -19 Vaccine 10yrs and older   Needs flu  shot       Flu shot given today.   Relevant Orders   Flu vaccine trivalent PF, 6mos and older(Flulaval,Afluria,Fluarix,Fluzone)        Follow up plan: Return March for physcial.

## 2023-03-05 NOTE — Assessment & Plan Note (Signed)
Under good control on current regimen. Continue current regimen. Continue to monitor. Call with any concerns. Refills given. Labs drawn today.   

## 2023-03-06 LAB — BASIC METABOLIC PANEL
BUN/Creatinine Ratio: 15 (ref 9–20)
BUN: 18 mg/dL (ref 6–20)
CO2: 20 mmol/L (ref 20–29)
Calcium: 9.4 mg/dL (ref 8.7–10.2)
Chloride: 104 mmol/L (ref 96–106)
Creatinine, Ser: 1.23 mg/dL (ref 0.76–1.27)
Glucose: 84 mg/dL (ref 70–99)
Potassium: 4.4 mmol/L (ref 3.5–5.2)
Sodium: 142 mmol/L (ref 134–144)
eGFR: 80 mL/min/{1.73_m2} (ref >59)

## 2023-03-06 NOTE — Anesthesia Postprocedure Evaluation (Signed)
Anesthesia Post Note  Patient: Jacob [name]  Procedure(s) Performed: CYSTOSCOPY/URETEROSCOPY/HOLMIUM LASER/STENT PLACEMENT (Right)  Patient location during evaluation: PACU Anesthesia Type: General Level of consciousness: awake and alert Pain management: pain level controlled Vital Signs Assessment: post-procedure vital signs reviewed and stable Respiratory status: spontaneous breathing, nonlabored ventilation, respiratory function stable and patient connected to nasal cannula oxygen Cardiovascular status: blood pressure returned to baseline and stable Postop Assessment: no apparent nausea or vomiting Anesthetic complications: no   No notable events documented.   Last Vitals:  Vitals:   02/23/23 1335 02/23/23 1340  BP:  (!) 145/88  Pulse: 83 95  Resp: 19 19  Temp: (!) 36.1 C   SpO2: 96% 98%    Last Pain:  Vitals:   02/23/23 1340  TempSrc:   PainSc: 0-No pain                 Lenard Simmer

## 2023-03-21 IMAGING — US US ABDOMEN LIMITED
1 series · 14 of 25 positions shown · non-contrast
Comparison: None.

CLINICAL DATA: Elevated liver enzymes

EXAM:
ULTRASOUND ABDOMEN LIMITED RIGHT UPPER QUADRANT

[Series 1: us abdomen limited ruq (liver/gb) · 14 of 75 slices shown]
[im 1/75]
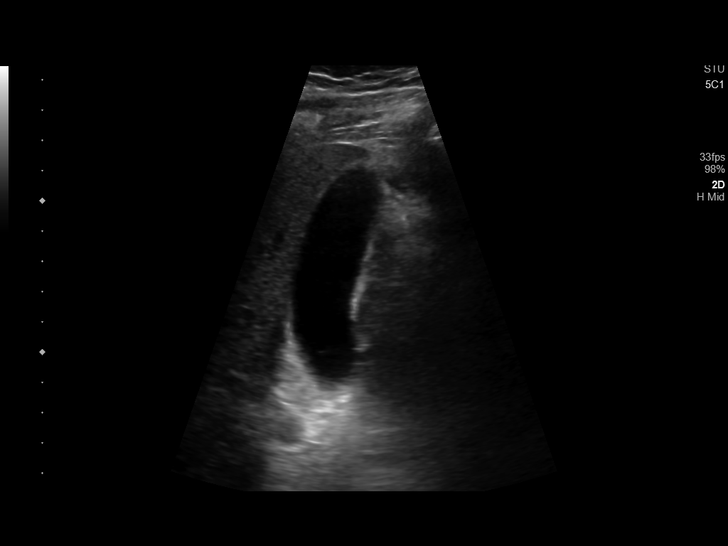
[im 7/75]
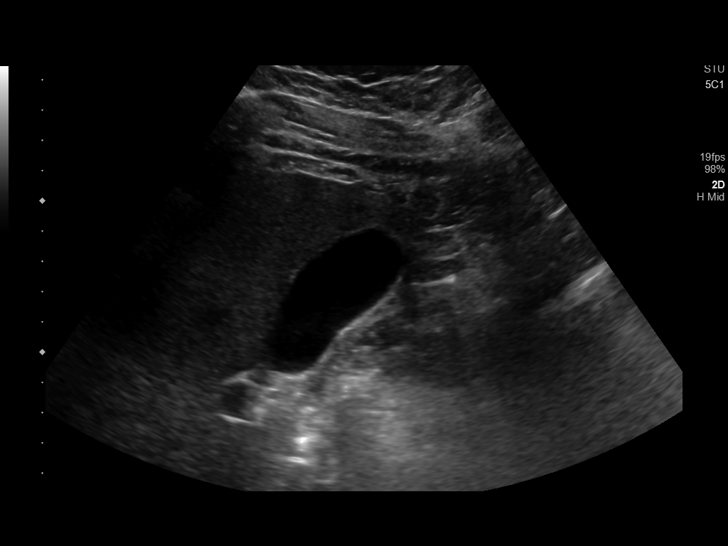
[im 13/75]
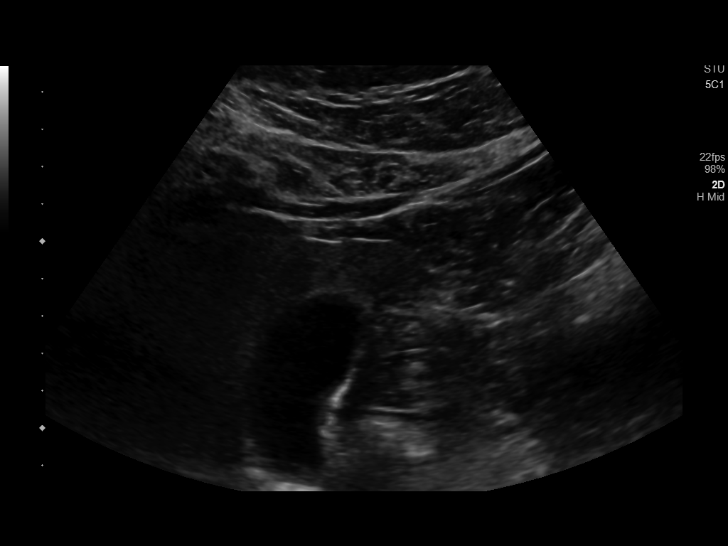
[im 19/75]
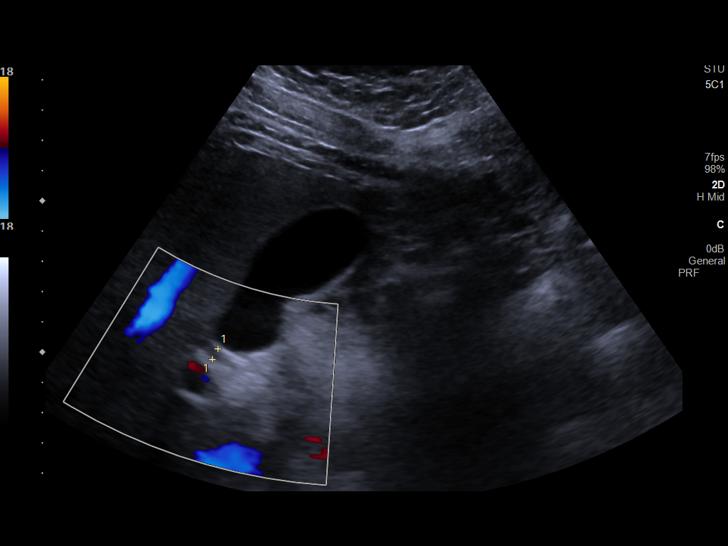
[im 25/75]
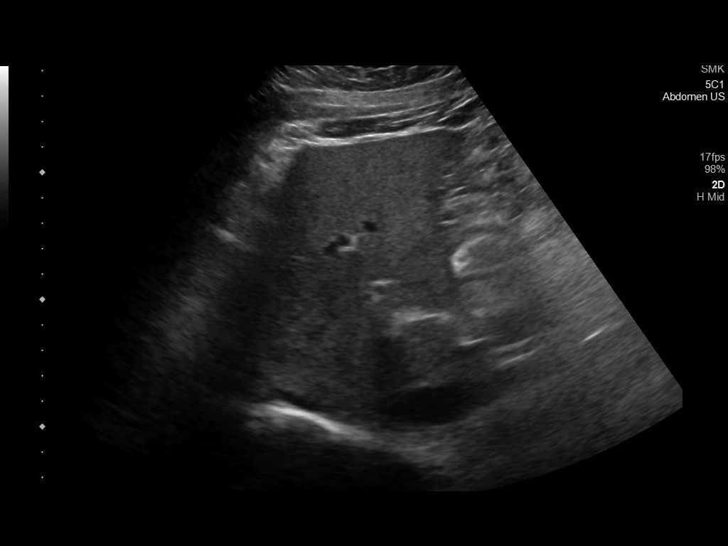
[im 28/75]
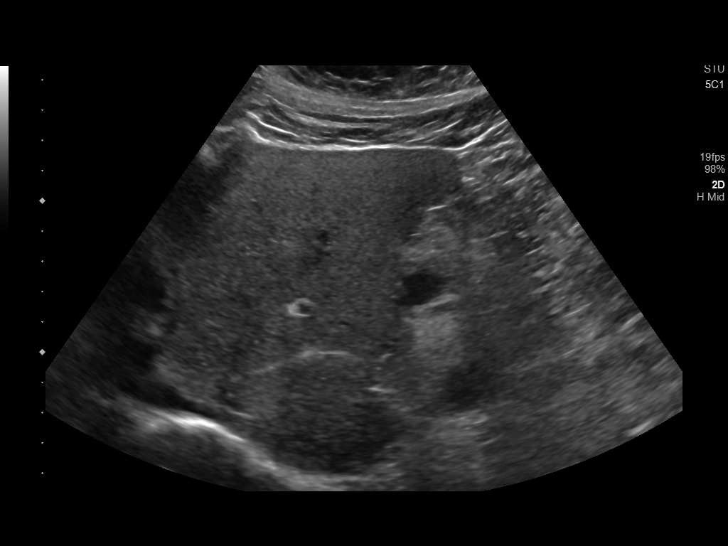
[im 34/75]
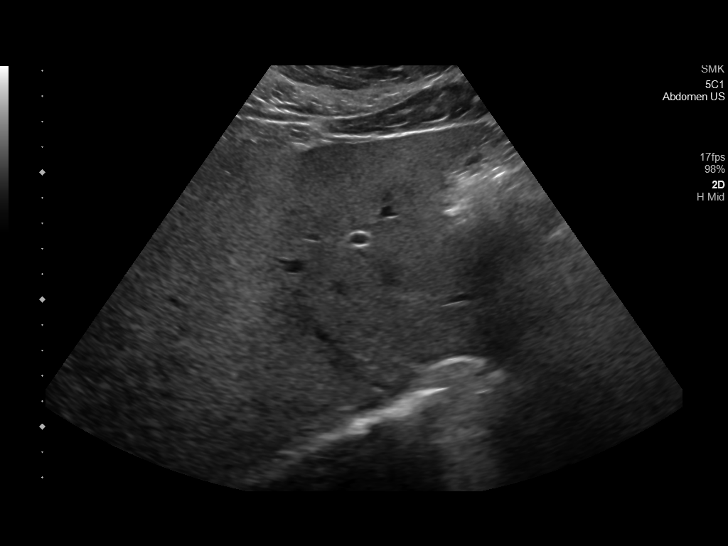
[im 41/75]
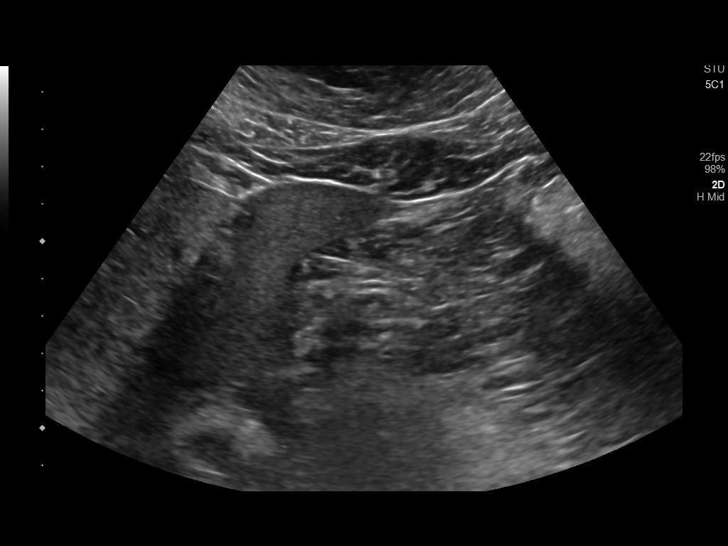
[im 47/75]
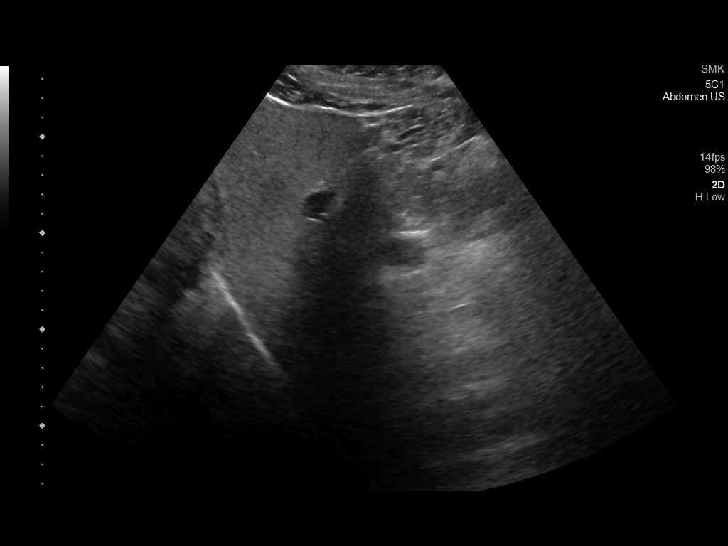
[im 50/75]
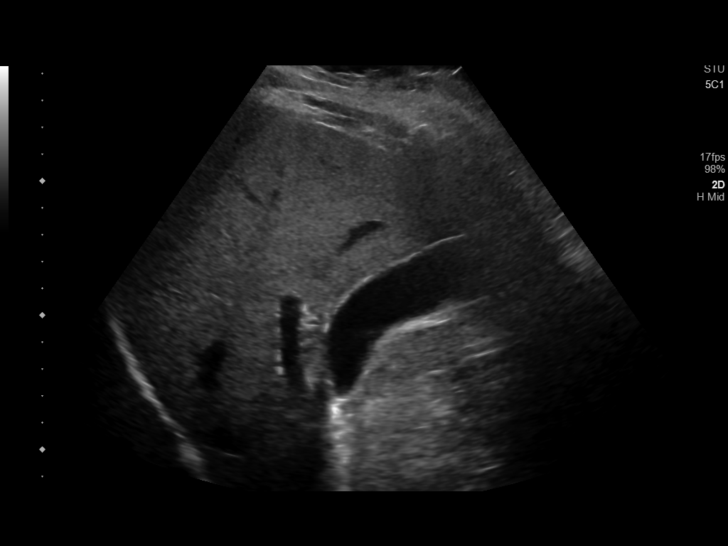
[im 56/75]
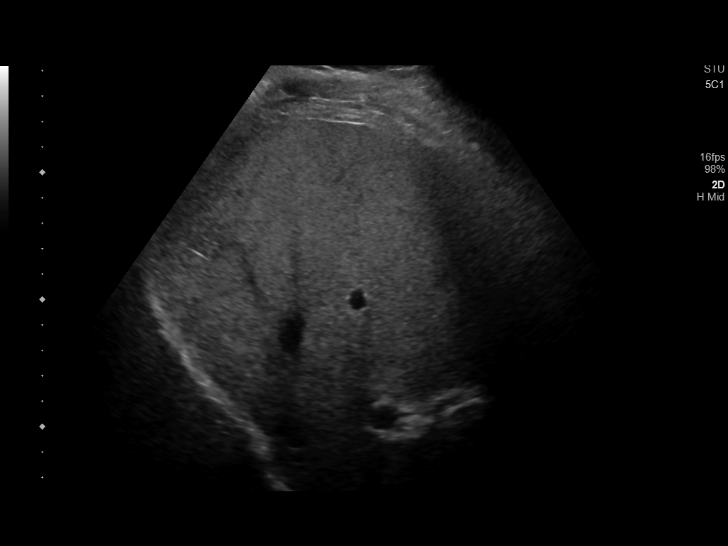
[im 62/75]
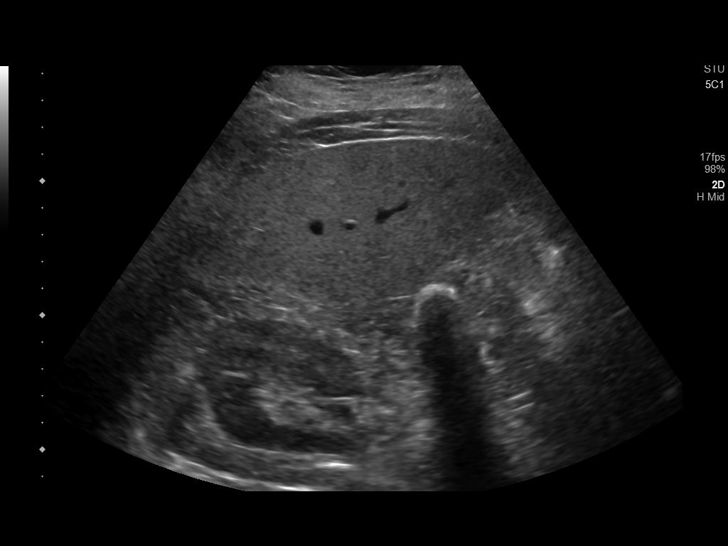
[im 68/75]
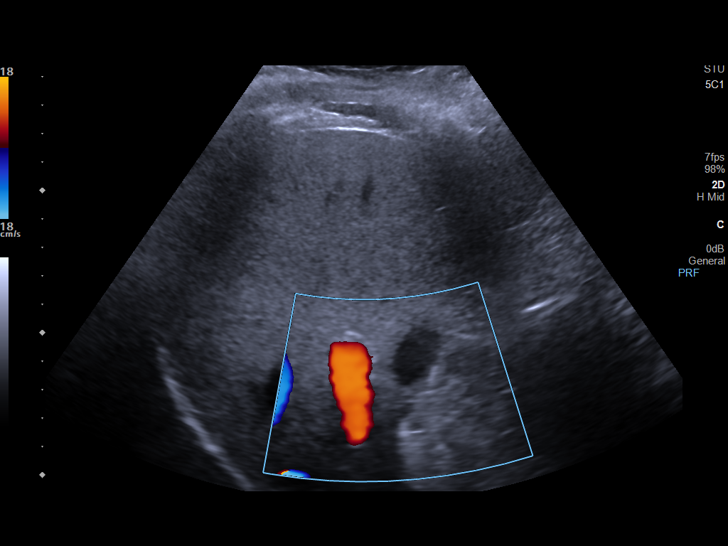
[im 75/75]
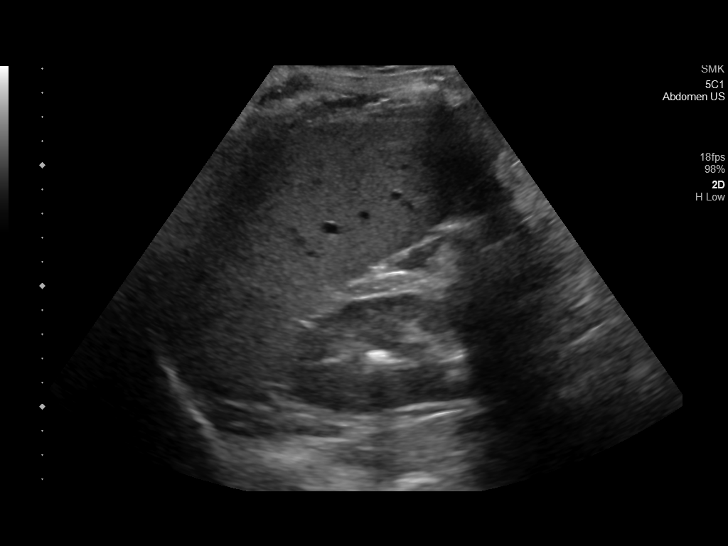

[14 of 25 positions shown; findings below may reference images not displayed]

FINDINGS: Gallbladder:

No gallstones or wall thickening visualized. No sonographic Murphy
sign noted by sonographer.

Common bile duct:

Diameter: 4 mm

Liver:

No focal lesion identified. Within normal limits in parenchymal
echogenicity. Portal vein is patent on color Doppler imaging with
normal direction of blood flow towards the liver.

Other: Incidental note made of 9 mm nonobstructing calculus in the
mid right kidney.
IMPRESSION: 1. No significant sonographic abnormality of the liver or
gallbladder
2. 9 mm nonobstructing right renal calculus.

## 2023-03-22 ENCOUNTER — Other Ambulatory Visit: Payer: BC Managed Care – PPO

## 2023-03-22 DIAGNOSIS — Z466 Encounter for fitting and adjustment of urinary device: Secondary | ICD-10-CM

## 2023-03-22 DIAGNOSIS — N201 Calculus of ureter: Secondary | ICD-10-CM | POA: Diagnosis not present

## 2023-03-23 LAB — LITHOLINK SERUM PANEL
CO2: 24 mmol/L (ref 20–29)
Calcium: 9.6 mg/dL (ref 8.7–10.2)
Chloride: 103 mmol/L (ref 96–106)
Creatinine, Ser: 0.93 mg/dL (ref 0.76–1.27)
Magnesium: 2.1 mg/dL (ref 1.6–2.3)
Phosphorus: 1.9 mg/dL — ABNORMAL LOW (ref 2.8–4.1)
Potassium: 4.5 mmol/L (ref 3.5–5.2)
Sodium: 143 mmol/L (ref 134–144)
Uric Acid: 7 mg/dL (ref 3.8–8.4)
eGFR: 112 mL/min/{1.73_m2} (ref >59)

## 2023-03-26 LAB — LITHOLINK 24HR URINE PANEL
Ammonium, Urine: 36 mmol/(24.h) (ref 15–60)
Calcium Oxalate Saturation: 4.61 — ABNORMAL LOW (ref 6.00–10.00)
Calcium Phosphate Saturation: 0.67 (ref 0.50–2.00)
Calcium, Urine: 70 mg/(24.h) (ref <250)
Calcium/Creatinine Ratio: 47 mg/g{creat} (ref 34–196)
Calcium/Kg Body Weight: 0.6 mg/kg/d (ref <4.0)
Chloride, Urine: 114 mmol/(24.h) (ref 70–250)
Citrate, Urine: <29 mg/(24.h) — ABNORMAL LOW (ref >450)
Creatinine, Urine: 1493 mg/(24.h)
Creatinine/Kg Body Weight: 12.2 mg/kg/d (ref 11.9–24.4)
Cystine, Urine, Qualitative: NEGATIVE
Magnesium, Urine: 55 mg/(24.h) (ref 30–120)
Oxalate, Urine: 47 mg/(24.h) — ABNORMAL HIGH (ref 20–40)
Phosphorus, Urine: 673 mg/(24.h) (ref 600–1200)
Potassium, Urine: 42 mmol/(24.h) (ref 20–100)
Protein Catabolic Rate: 0.5 g/kg/d — ABNORMAL LOW (ref 0.8–1.4)
Sodium, Urine: 109 mmol/(24.h) (ref 50–150)
Sulfate, Urine: 16 meq/(24.h) — ABNORMAL LOW (ref 20–80)
Urea Nitrogen, Urine: 6.34 g/(24.h) (ref 6.00–14.00)
Uric Acid Saturation: 0.2 (ref <1.00)
Uric Acid, Urine: 641 mg/(24.h) (ref <800)
Urine Volume (Preserved): 1900 mL/(24.h) (ref 500–4000)
pH, 24 hr, Urine: 6.595 — ABNORMAL HIGH (ref 5.800–6.200)

## 2023-04-09 DIAGNOSIS — F900 Attention-deficit hyperactivity disorder, predominantly inattentive type: Secondary | ICD-10-CM | POA: Diagnosis not present

## 2023-04-09 DIAGNOSIS — F331 Major depressive disorder, recurrent, moderate: Secondary | ICD-10-CM | POA: Diagnosis not present

## 2023-05-03 ENCOUNTER — Other Ambulatory Visit: Payer: Self-pay | Admitting: Family Medicine

## 2023-05-03 MED ORDER — METOPROLOL SUCCINATE ER 25 MG PO TB24: 25.0000 mg | ORAL_TABLET | Freq: Every day | ORAL | 0 refills | Status: DC

## 2023-05-14 ENCOUNTER — Ambulatory Visit: Payer: BC Managed Care – PPO | Admitting: Urology

## 2023-06-10 ENCOUNTER — Other Ambulatory Visit: Payer: Self-pay | Admitting: Family Medicine

## 2023-06-10 NOTE — Telephone Encounter (Signed)
Request is too soon fir refill, last refill 05/03/23 for 90 days.  Requested Prescriptions  Pending Prescriptions Disp Refills   metoprolol succinate (TOPROL-XL) 25 MG 24 hr tablet [Pharmacy Med Name: METOPROLOL SUCC ER 25 MG TAB] 45 tablet     Sig: TAKE 1/2 TABLET BY MOUTH EVERY DAY     Cardiovascular:  Beta Blockers Failed - 06/10/2023 11:48 AM      Failed - Last Heart Rate in normal range    Pulse Readings from Last 1 Encounters:  03/05/23 (!) 114         Passed - Last BP in normal range    BP Readings from Last 1 Encounters:  03/05/23 126/77         Passed - Valid encounter within last 6 months    Recent Outpatient Visits           3 months ago Primary hypertension   [place] [facility] [place], [place], DO   4 months ago Lower abdominal pain   [place] [facility] [place], Megan P, DO   10 months ago Routine general medical examination at a health care facility   [place] [facility], Megan P, DO   11 months ago Primary hypertension   [facility] [place], [place], DO   1 year ago Palpitations   [place] [facility] [place], Oralia Rud, DO       Future Appointments             In 1 month [place], Oralia Rud, DO  [facility], [facility]

## 2023-07-15 DIAGNOSIS — F331 Major depressive disorder, recurrent, moderate: Secondary | ICD-10-CM | POA: Diagnosis not present

## 2023-07-15 DIAGNOSIS — F900 Attention-deficit hyperactivity disorder, predominantly inattentive type: Secondary | ICD-10-CM | POA: Diagnosis not present

## 2023-07-29 ENCOUNTER — Other Ambulatory Visit: Payer: Self-pay | Admitting: Family Medicine

## 2023-07-29 NOTE — Telephone Encounter (Signed)
 Requested Prescriptions  Pending Prescriptions Disp Refills   metoprolol succinate (TOPROL-XL) 25 MG 24 hr tablet [Pharmacy Med Name: METOPROLOL SUCC ER 25 MG TAB] 90 tablet 0    Sig: TAKE 1 TABLET (25 MG TOTAL) BY MOUTH DAILY.     Cardiovascular:  Beta Blockers Failed - 07/29/2023  3:28 PM      Failed - Last Heart Rate in normal range    Pulse Readings from Last 1 Encounters:  03/05/23 (!) 114         Passed - Last BP in normal range    BP Readings from Last 1 Encounters:  03/05/23 126/77         Passed - Valid encounter within last 6 months    Recent Outpatient Visits           4 months ago Primary hypertension   [place] [facility] [place], [place], DO   5 months ago Lower abdominal pain   [place] [facility] [place], Megan P, DO   11 months ago Routine general medical examination at a health care facility   [facility] [place], [place] P, DO   1 year ago Primary hypertension   [place] [facility] [place], [place], DO   1 year ago Palpitations   [place] [facility] [place], Oralia Rud, DO       Future Appointments             In 1 week [place], Oralia Rud, DO [place] [facility]

## 2023-08-06 ENCOUNTER — Ambulatory Visit (INDEPENDENT_AMBULATORY_CARE_PROVIDER_SITE_OTHER): Payer: BC Managed Care – PPO | Admitting: Family Medicine

## 2023-08-06 VITALS — BP 134/88 | HR 118 | Temp 98.3°F | Resp 18 | Ht 71.58 in | Wt 265.6 lb

## 2023-08-06 DIAGNOSIS — Z0000 Encounter for general adult medical examination without abnormal findings: Secondary | ICD-10-CM | POA: Diagnosis not present

## 2023-08-06 DIAGNOSIS — F331 Major depressive disorder, recurrent, moderate: Secondary | ICD-10-CM | POA: Diagnosis not present

## 2023-08-06 DIAGNOSIS — I10 Essential (primary) hypertension: Secondary | ICD-10-CM

## 2023-08-06 DIAGNOSIS — K219 Gastro-esophageal reflux disease without esophagitis: Secondary | ICD-10-CM

## 2023-08-06 LAB — MICROALBUMIN, URINE WAIVED
Creatinine, Urine Waived: 300 mg/dL (ref 10–300)
Microalb, Ur Waived: 80 mg/L — ABNORMAL HIGH (ref 0–19)

## 2023-08-06 MED ORDER — LISINOPRIL 10 MG PO TABS: 10.0000 mg | ORAL_TABLET | Freq: Every day | ORAL | 1 refills | Status: DC

## 2023-08-06 MED ORDER — METOPROLOL SUCCINATE ER 25 MG PO TB24: 25.0000 mg | ORAL_TABLET | Freq: Every day | ORAL | 1 refills | Status: DC

## 2023-08-06 NOTE — Progress Notes (Signed)
 BP 134/88   Pulse (!) 118   Temp 98.3 F (36.8 C) (Oral)   Resp 18   Ht 5' 11.58" (1.818 m)   Wt 265 lb 9.6 oz (120.5 kg)   SpO2 96%   BMI 36.45 kg/m    Subjective:    Patient ID: Jacob [name], male    DOB: 21-Jun-1990, 33 y.o.   MRN: [id number]  HPI: Harshan Kearley is a 33 y.o. male presenting on 08/06/2023 for comprehensive medical examination. Current medical complaints include:  HYPERTENSION  Hypertension status: controlled  Satisfied with current treatment? yes Duration of hypertension: chronic BP monitoring frequency:  not checking BP medication side effects:  no Medication compliance: excellent compliance Previous BP meds:metoprolol, lisinopril Aspirin: no Recurrent headaches: no Visual changes: no Palpitations: yes Dyspnea: no Chest pain: no Lower extremity edema: no Dizzy/lightheaded: no  DEPRESSION Mood status: better Satisfied with current treatment?: no Symptom severity: moderate  Duration of current treatment : chronic Side effects: no Medication compliance: excellent compliance Psychotherapy/counseling: yes  Depressed mood: yes Anxious mood: yes Anhedonia: no Significant weight loss or gain: no Insomnia: no  Fatigue: yes Feelings of worthlessness or guilt: no Impaired concentration/indecisiveness: no Suicidal ideations: no Hopelessness: no Crying spells: no    08/06/2023    8:01 AM 03/05/2023    3:38 PM 02/05/2023    9:06 AM 08/05/2022    8:54 AM 07/07/2022   10:01 AM  Depression screen PHQ 2/9  Decreased Interest 0 0 2 0 3  Down, Depressed, Hopeless 1 0 2 1 1   PHQ - 2 Score 1 0 4 1 4   Altered sleeping 1 1 3 1  0  Tired, decreased energy 1 2 3 3 3   Change in appetite 0 2 2 0 0  Feeling bad or failure about yourself  0 0 0 2 1  Trouble concentrating 1 0 2 0 0  Moving slowly or fidgety/restless 0 0 0 0 0  Suicidal thoughts 0 0 0 0 0  PHQ-9 Score 4 5 14 7 8   Difficult doing work/chores Not difficult at all Not difficult at all Very difficult  Very difficult Very difficult   Interim Problems from his last visit: no  Depression Screen done today and results listed below:     08/06/2023    8:01 AM 03/05/2023    3:38 PM 02/05/2023    9:06 AM 08/05/2022    8:54 AM 07/07/2022   10:01 AM  Depression screen PHQ 2/9  Decreased Interest 0 0 2 0 3  Down, Depressed, Hopeless 1 0 2 1 1   PHQ - 2 Score 1 0 4 1 4   Altered sleeping 1 1 3 1  0  Tired, decreased energy 1 2 3 3 3   Change in appetite 0 2 2 0 0  Feeling bad or failure about yourself  0 0 0 2 1  Trouble concentrating 1 0 2 0 0  Moving slowly or fidgety/restless 0 0 0 0 0  Suicidal thoughts 0 0 0 0 0  PHQ-9 Score 4 5 14 7 8   Difficult doing work/chores Not difficult at all Not difficult at all Very difficult Very difficult Very difficult     Past Medical History:  Past Medical History:  Diagnosis Date   Anxiety    COVID-19 01/2023   Depression    GERD (gastroesophageal reflux disease)    Heart murmur 05/25/2009   I saw a doctor around this time because I was worried about my heart. She said I had a  heart murmur.   History of 2019 novel coronavirus disease (COVID-19) 02/12/2023   a.) home rapid Ag testing (+) 02/12/2023   History of kidney stones    Hypertension     Surgical History:  Past Surgical History:  Procedure Laterality Date   COLONOSCOPY WITH PROPOFOL N/A 06/04/2021   Procedure: COLONOSCOPY WITH PROPOFOL;  Surgeon: Toney Reil, MD;  Location: [facility] ENDOSCOPY;  Service: Gastroenterology;  Laterality: N/A;   CYSTOSCOPY/URETEROSCOPY/HOLMIUM LASER/STENT PLACEMENT Right 02/23/2023   Procedure: CYSTOSCOPY/URETEROSCOPY/HOLMIUM LASER/STENT PLACEMENT;  Surgeon: Riki Altes, MD;  Location: [facility] ORS;  Service: Urology;  Laterality: Right;   ESOPHAGOGASTRODUODENOSCOPY (EGD) WITH PROPOFOL N/A 09/11/2020   Procedure: ESOPHAGOGASTRODUODENOSCOPY (EGD) WITH PROPOFOL;  Surgeon: Toney Reil, MD;  Location: [facility] ENDOSCOPY;  Service: Gastroenterology;   Laterality: N/A;   FLEXIBLE SIGMOIDOSCOPY N/A 09/11/2020   Procedure: FLEXIBLE SIGMOIDOSCOPY;  Surgeon: Toney Reil, MD;  Location: [facility] ENDOSCOPY;  Service: Gastroenterology;  Laterality: N/A;    Medications:  Current Outpatient Medications on File Prior to Visit  Medication Sig   DULoxetine HCl 30 MG CSDR Take 30 capsules by mouth daily.   LORazepam (ATIVAN) 0.5 MG tablet Take 1 tablet (0.5 mg total) by mouth 2 (two) times daily as needed for anxiety.   methylphenidate 10 MG ER tablet Take 10 mg by mouth every morning.   omeprazole (PRILOSEC) 20 MG capsule Take 1 capsule (20 mg total) by mouth daily.   No current facility-administered medications on file prior to visit.    Allergies:  Allergies  Allergen Reactions   Lexapro [Escitalopram]     Made patient dizzy   Wellbutrin [Bupropion] Other (See Comments)    Brain fog    Social History:  Social History   Socioeconomic History   Marital status: Media planner    Spouse name: Not on file   Number of children: Not on file   Years of education: Not on file   Highest education level: Some college, no degree  Occupational History   Not on file  Tobacco Use   Smoking status: Never   Smokeless tobacco: Never   Tobacco comments:    Vapes once per day to every other day  Vaping Use   Vaping status: Some Days   Substances: THC  Substance and Sexual Activity   Alcohol use: Yes    Alcohol/week: 2.0 standard drinks of alcohol    Types: 2 Standard drinks or equivalent per week    Comment: RARELY drink, usually socially or special events.   Drug use: Yes    Frequency: 7.0 times per week    Types: Marijuana    Comment: daily   Sexual activity: Yes    Birth control/protection: None    Comment: Partner and I are cis male  Other Topics Concern   Not on file  Social History Narrative   Not on file   Social Drivers of Health   Financial Resource Strain: Low Risk  (08/06/2023)   Overall Financial Resource Strain  (CARDIA)    Difficulty of Paying Living Expenses: Not hard at all  Food Insecurity: No Food Insecurity (08/06/2023)   Hunger Vital Sign    Worried About Running Out of Food in the Last Year: Never true    Ran Out of Food in the Last Year: Never true  Transportation Needs: No Transportation Needs (08/06/2023)   PRAPARE - Administrator, Civil Service (Medical): No    Lack of Transportation (Non-Medical): No  Physical Activity: Inactive (08/06/2023)  Exercise Vital Sign    Days of Exercise per Week: 0 days    Minutes of Exercise per Session: 0 min  Stress: No Stress Concern Present (08/06/2023)   [employer] of Occupational Health - Occupational Stress Questionnaire    Feeling of Stress : Only a little  Recent Concern: Stress - Stress Concern Present (08/06/2023)   [employer] of Occupational Health - Occupational Stress Questionnaire    Feeling of Stress : Rather much  Social Connections: Socially Isolated (08/06/2023)   Social Connection and Isolation Panel [NHANES]    Frequency of Communication with Friends and Family: Never    Frequency of Social Gatherings with Friends and Family: Never    Attends Religious Services: Never    Database administrator or Organizations: No    Attends Engineer, structural: Never    Marital Status: Living with partner  Intimate Partner Violence: Not on file   Social History   Tobacco Use  Smoking Status Never  Smokeless Tobacco Never  Tobacco Comments   Vapes once per day to every other day   Social History   Substance and Sexual Activity  Alcohol Use Yes   Alcohol/week: 2.0 standard drinks of alcohol   Types: 2 Standard drinks or equivalent per week   Comment: RARELY drink, usually socially or special events.    Family History:  Family History  Problem Relation Age of Onset   ADD / ADHD Mother    Diabetes Mother    Obesity Mother    Early death Father    Autoimmune disease Father    Autoimmune disease  Sister    Early death Brother    Autoimmune disease Brother    ADD / ADHD Maternal Grandmother    Autism spectrum disorder Half-Brother     Past medical history, surgical history, medications, allergies, family history and social history reviewed with patient today and changes made to appropriate areas of the chart.   Review of Systems  Constitutional: Negative.   HENT: Negative.    Eyes: Negative.   Respiratory: Negative.    Cardiovascular:  Positive for palpitations. Negative for chest pain, orthopnea, claudication, leg swelling and PND.  Gastrointestinal:  Positive for diarrhea and heartburn. Negative for abdominal pain, blood in stool, constipation, melena, nausea and vomiting.  Genitourinary: Negative.   Musculoskeletal: Negative.   Skin: Negative.   Neurological: Negative.   Endo/Heme/Allergies: Negative.   Psychiatric/Behavioral: Negative.     All other ROS negative except what is listed above and in the HPI.      Objective:    BP 134/88   Pulse (!) 118   Temp 98.3 F (36.8 C) (Oral)   Resp 18   Ht 5' 11.58" (1.818 m)   Wt 265 lb 9.6 oz (120.5 kg)   SpO2 96%   BMI 36.45 kg/m   Wt Readings from Last 3 Encounters:  08/06/23 265 lb 9.6 oz (120.5 kg)  03/05/23 264 lb (119.7 kg)  03/03/23 278 lb (126.1 kg)    Physical Exam Vitals and nursing note reviewed.  Constitutional:      General: He is not in acute distress.    Appearance: Normal appearance. He is obese. He is not ill-appearing, toxic-appearing or diaphoretic.  HENT:     Head: Normocephalic and atraumatic.     Right Ear: Tympanic membrane, ear canal and external ear normal. There is no impacted cerumen.     Left Ear: Tympanic membrane, ear canal and external ear normal. There is no  impacted cerumen.     Nose: Nose normal. No congestion or rhinorrhea.     Mouth/Throat:     Mouth: Mucous membranes are moist.     Pharynx: Oropharynx is clear. No oropharyngeal exudate or posterior oropharyngeal erythema.   Eyes:     General: No scleral icterus.       Right eye: No discharge.        Left eye: No discharge.     Extraocular Movements: Extraocular movements intact.     Conjunctiva/sclera: Conjunctivae normal.     Pupils: Pupils are equal, round, and reactive to light.  Neck:     Vascular: No carotid bruit.  Cardiovascular:     Rate and Rhythm: Normal rate and regular rhythm.     Pulses: Normal pulses.     Heart sounds: No murmur heard.    No friction rub. No gallop.  Pulmonary:     Effort: Pulmonary effort is normal. No respiratory distress.     Breath sounds: Normal breath sounds. No stridor. No wheezing, rhonchi or rales.  Chest:     Chest wall: No tenderness.  Abdominal:     General: Abdomen is flat. Bowel sounds are normal. There is no distension.     Palpations: Abdomen is soft. There is no mass.     Tenderness: There is no abdominal tenderness. There is no right CVA tenderness, left CVA tenderness, guarding or rebound.     Hernia: No hernia is present.  Genitourinary:    Comments: Genital exam deferred with shared decision making Musculoskeletal:        General: No swelling, tenderness, deformity or signs of injury.     Cervical back: Normal range of motion and neck supple. No rigidity. No muscular tenderness.     Right lower leg: No edema.     Left lower leg: No edema.  Lymphadenopathy:     Cervical: No cervical adenopathy.  Skin:    General: Skin is warm and dry.     Capillary Refill: Capillary refill takes less than 2 seconds.     Coloration: Skin is not jaundiced or pale.     Findings: No bruising, erythema, lesion or rash.  Neurological:     General: No focal deficit present.     Mental Status: He is alert and oriented to person, place, and time.     Cranial Nerves: No cranial nerve deficit.     Sensory: No sensory deficit.     Motor: No weakness.     Coordination: Coordination normal.     Gait: Gait normal.     Deep Tendon Reflexes: Reflexes normal.   Psychiatric:        Mood and Affect: Mood normal.        Behavior: Behavior normal.        Thought Content: Thought content normal.        Judgment: Judgment normal.     Results for orders placed or performed in visit on 03/22/23  [facility] Serum Panel   Collection Time: 03/22/23 10:40 AM  Result Value Ref Range   Uric Acid 7.0 3.8 - 8.4 mg/dL   Creatinine, Ser 1.61 0.76 - 1.27 mg/dL   eGFR 096 >04 VW/UJW/1.19   Sodium 143 134 - 144 mmol/L   Potassium 4.5 3.5 - 5.2 mmol/L   Chloride 103 96 - 106 mmol/L   CO2 24 20 - 29 mmol/L   Calcium 9.6 8.7 - 10.2 mg/dL   Phosphorus 1.9 (L) 2.8 - 4.1 mg/dL  Magnesium 2.1 1.6 - 2.3 mg/dL      Assessment & Plan:   Problem List Items Addressed This Visit       Cardiovascular and Mediastinum   Primary hypertension   Under good control on current regimen. Continue current regimen. Continue to monitor. Call with any concerns. Refills given. Labs drawn today.        Relevant Medications   lisinopril (ZESTRIL) 10 MG tablet   metoprolol succinate (TOPROL-XL) 25 MG 24 hr tablet   Other Relevant Orders   Microalbumin, Urine Waived     Digestive   Chronic GERD   Under good control on current regimen. Continue current regimen. Continue to monitor. Call with any concerns. Rx OTC. Labs drawn today.         Other   Depression   Continue to follow with psychiatry. Call with any concerns. Continue to monitor.       Relevant Medications   DULoxetine HCl 30 MG CSDR   Other Visit Diagnoses       Routine general medical examination at a health care facility    -  Primary   Vaccines up to date. Screening labs checked today. Continue diet and exercise. Call with any concerns.   Relevant Orders   TSH   Lipid panel   CBC with Differential/Platelet   Comprehensive metabolic panel       LABORATORY TESTING:  Health maintenance labs ordered today as discussed above.   IMMUNIZATIONS:   - Tdap: Tetanus vaccination status reviewed: last  tetanus booster within 10 years. - Influenza: Up to date - Pneumovax: Not applicable - Prevnar: Not applicable - COVID: Up to date - HPV: Not applicable - Shingrix vaccine: Not applicable  PATIENT COUNSELING:    Sexuality: Discussed sexually transmitted diseases, partner selection, use of condoms, avoidance of unintended pregnancy  and contraceptive alternatives.   Advised to avoid cigarette smoking.  I discussed with the patient that most people either abstain from alcohol or drink within safe limits (<=14/week and <=4 drinks/occasion for males, <=7/weeks and <= 3 drinks/occasion for females) and that the risk for alcohol disorders and other health effects rises proportionally with the number of drinks per week and how often a drinker exceeds daily limits.  Discussed cessation/primary prevention of drug use and availability of treatment for abuse.   Diet: Encouraged to adjust caloric intake to maintain  or achieve ideal body weight, to reduce intake of dietary saturated fat and total fat, to limit sodium intake by avoiding high sodium foods and not adding table salt, and to maintain adequate dietary potassium and calcium preferably from fresh fruits, vegetables, and low-fat dairy products.    stressed the importance of regular exercise  Injury prevention: Discussed safety belts, safety helmets, smoke detector, smoking near bedding or upholstery.   Dental health: Discussed importance of regular tooth brushing, flossing, and dental visits.   Follow up plan: NEXT PREVENTATIVE PHYSICAL DUE IN 1 YEAR. Return in about 6 months (around 02/06/2024).

## 2023-08-06 NOTE — Assessment & Plan Note (Signed)
 Continue to follow with psychiatry. Call with any concerns. Continue to monitor.

## 2023-08-06 NOTE — Addendum Note (Signed)
 Addended by: Dorcas Carrow on: 08/06/2023 01:20 PM   Modules accepted: Orders

## 2023-08-06 NOTE — Assessment & Plan Note (Signed)
 Under good control on current regimen. Continue current regimen. Continue to monitor. Call with any concerns. Refills given. Labs drawn today.

## 2023-08-06 NOTE — Assessment & Plan Note (Signed)
 Under good control on current regimen. Continue current regimen. Continue to monitor. Call with any concerns. Rx OTC. Labs drawn today.

## 2023-08-07 LAB — CBC WITH DIFFERENTIAL/PLATELET
Basophils Absolute: 0 10*3/uL (ref 0.0–0.2)
Basos: 0 %
EOS (ABSOLUTE): 0.1 10*3/uL (ref 0.0–0.4)
Eos: 2 %
Hematocrit: 49.7 % (ref 37.5–51.0)
Hemoglobin: 16.8 g/dL (ref 13.0–17.7)
Immature Grans (Abs): 0 10*3/uL (ref 0.0–0.1)
Immature Granulocytes: 0 %
Lymphocytes Absolute: 1.7 10*3/uL (ref 0.7–3.1)
Lymphs: 26 %
MCH: 30.4 pg (ref 26.6–33.0)
MCHC: 33.8 g/dL (ref 31.5–35.7)
MCV: 90 fL (ref 79–97)
Monocytes Absolute: 0.4 10*3/uL (ref 0.1–0.9)
Monocytes: 6 %
Neutrophils Absolute: 4.1 10*3/uL (ref 1.4–7.0)
Neutrophils: 66 %
Platelets: 209 10*3/uL (ref 150–450)
RBC: 5.52 x10E6/uL (ref 4.14–5.80)
RDW: 12.6 % (ref 11.6–15.4)
WBC: 6.4 10*3/uL (ref 3.4–10.8)

## 2023-08-07 LAB — LIPID PANEL
Chol/HDL Ratio: 5.1 ratio — ABNORMAL HIGH (ref 0.0–5.0)
Cholesterol, Total: 179 mg/dL (ref 100–199)
HDL: 35 mg/dL — ABNORMAL LOW (ref >39)
LDL Chol Calc (NIH): 110 mg/dL — ABNORMAL HIGH (ref 0–99)
Triglycerides: 191 mg/dL — ABNORMAL HIGH (ref 0–149)
VLDL Cholesterol Cal: 34 mg/dL (ref 5–40)

## 2023-08-07 LAB — COMPREHENSIVE METABOLIC PANEL
ALT: 42 IU/L (ref 0–44)
AST: 24 IU/L (ref 0–40)
Albumin: 4.8 g/dL (ref 4.1–5.1)
Alkaline Phosphatase: 98 IU/L (ref 44–121)
BUN/Creatinine Ratio: 7 — ABNORMAL LOW (ref 9–20)
BUN: 8 mg/dL (ref 6–20)
Bilirubin Total: 0.6 mg/dL (ref 0.0–1.2)
CO2: 24 mmol/L (ref 20–29)
Calcium: 9.8 mg/dL (ref 8.7–10.2)
Chloride: 104 mmol/L (ref 96–106)
Creatinine, Ser: 1.07 mg/dL (ref 0.76–1.27)
Globulin, Total: 1.8 g/dL (ref 1.5–4.5)
Glucose: 99 mg/dL (ref 70–99)
Potassium: 4.5 mmol/L (ref 3.5–5.2)
Sodium: 143 mmol/L (ref 134–144)
Total Protein: 6.6 g/dL (ref 6.0–8.5)
eGFR: 95 mL/min/{1.73_m2} (ref >59)

## 2023-08-07 LAB — TSH: TSH: 1.27 u[IU]/mL (ref 0.450–4.500)

## 2023-08-11 DIAGNOSIS — F900 Attention-deficit hyperactivity disorder, predominantly inattentive type: Secondary | ICD-10-CM | POA: Diagnosis not present

## 2023-08-11 DIAGNOSIS — F331 Major depressive disorder, recurrent, moderate: Secondary | ICD-10-CM | POA: Diagnosis not present

## 2023-08-13 ENCOUNTER — Encounter: Payer: Self-pay | Admitting: Family Medicine

## 2023-09-15 DIAGNOSIS — F331 Major depressive disorder, recurrent, moderate: Secondary | ICD-10-CM | POA: Diagnosis not present

## 2023-09-15 DIAGNOSIS — F900 Attention-deficit hyperactivity disorder, predominantly inattentive type: Secondary | ICD-10-CM | POA: Diagnosis not present

## 2023-10-27 ENCOUNTER — Other Ambulatory Visit: Payer: Self-pay | Admitting: Family Medicine

## 2023-10-28 NOTE — Telephone Encounter (Signed)
 Requested Prescriptions  Pending Prescriptions Disp Refills   lisinopril  (ZESTRIL ) 10 MG tablet [Pharmacy Med Name: LISINOPRIL  10 MG TABLET] 30 tablet 3    Sig: TAKE 1 TABLET BY MOUTH EVERY DAY     Cardiovascular:  ACE Inhibitors Passed - 10/28/2023  5:00 PM      Passed - Cr in normal range and within 180 days    Creatinine, Ser  Date Value Ref Range Status  08/06/2023 1.07 0.76 - 1.27 mg/dL Final         Passed - K in normal range and within 180 days    Potassium  Date Value Ref Range Status  08/06/2023 4.5 3.5 - 5.2 mmol/L Final         Passed - Patient is not pregnant      Passed - Last BP in normal range    BP Readings from Last 1 Encounters:  08/06/23 134/88         Passed - Valid encounter within last 6 months    Recent Outpatient Visits           2 months ago Routine general medical examination at a health care facility   [facility] [place], O'Kean P, DO

## 2023-12-12 ENCOUNTER — Encounter: Payer: Self-pay | Admitting: Urology

## 2023-12-12 ENCOUNTER — Other Ambulatory Visit: Payer: Self-pay | Admitting: Urology

## 2023-12-12 DIAGNOSIS — R82998 Other abnormal findings in urine: Secondary | ICD-10-CM

## 2023-12-12 DIAGNOSIS — R82991 Hypocitraturia: Secondary | ICD-10-CM

## 2023-12-15 DIAGNOSIS — F900 Attention-deficit hyperactivity disorder, predominantly inattentive type: Secondary | ICD-10-CM | POA: Diagnosis not present

## 2023-12-15 DIAGNOSIS — F331 Major depressive disorder, recurrent, moderate: Secondary | ICD-10-CM | POA: Diagnosis not present

## 2024-02-08 ENCOUNTER — Ambulatory Visit (INDEPENDENT_AMBULATORY_CARE_PROVIDER_SITE_OTHER): Admitting: Family Medicine

## 2024-02-08 VITALS — BP 130/85 | HR 104 | Ht 72.5 in | Wt 278.6 lb

## 2024-02-08 DIAGNOSIS — Z789 Other specified health status: Secondary | ICD-10-CM | POA: Diagnosis not present

## 2024-02-08 DIAGNOSIS — Z23 Encounter for immunization: Secondary | ICD-10-CM

## 2024-02-08 DIAGNOSIS — I10 Essential (primary) hypertension: Secondary | ICD-10-CM | POA: Diagnosis not present

## 2024-02-08 DIAGNOSIS — K219 Gastro-esophageal reflux disease without esophagitis: Secondary | ICD-10-CM

## 2024-02-08 MED ORDER — METOPROLOL SUCCINATE ER 25 MG PO TB24: 25.0000 mg | ORAL_TABLET | Freq: Every day | ORAL | 1 refills | Status: AC

## 2024-02-08 MED ORDER — LISINOPRIL 10 MG PO TABS: 10.0000 mg | ORAL_TABLET | Freq: Every day | ORAL | 1 refills | Status: AC

## 2024-02-08 NOTE — Assessment & Plan Note (Signed)
 Under good control on current regimen. Continue current regimen. Continue to monitor. Call with any concerns. Refills given. Labs drawn today.

## 2024-02-08 NOTE — Progress Notes (Signed)
 BP 130/85   Pulse (!) 104   Ht 6' 0.5 (1.842 m)   Wt 278 lb 9.6 oz (126.4 kg)   SpO2 98%   BMI 37.27 kg/m    Subjective:    Patient ID: Jacob [name], male    DOB: Apr 07, 1991, 33 y.o.   MRN: [id number]  HPI: Jacob [name] is a 33 y.o. male  Chief Complaint  Patient presents with   Hypertension   HYPERTENSION  Hypertension status: controlled  Satisfied with current treatment? yes Duration of hypertension: chronic BP monitoring frequency:  not checking BP medication side effects:  no Medication compliance: excellent compliance Previous BP meds: lisinopril , metoprolol  Aspirin: no Recurrent headaches: no Visual changes: no Palpitations: no Dyspnea: no Chest pain: no Lower extremity edema: no Dizzy/lightheaded: no  GERD GERD control status: controlled Satisfied with current treatment? yes Heartburn frequency: not when he takes his medicine Medication side effects: no  Medication compliance: excellent Dysphagia: no Odynophagia:  no Hematemesis: no Blood in stool: no EGD: yes   Relevant past medical, surgical, family and social history reviewed and updated as indicated. Interim medical history since our last visit reviewed. Allergies and medications reviewed and updated.  Review of Systems  Constitutional: Negative.   Respiratory: Negative.    Cardiovascular: Negative.   Musculoskeletal: Negative.   Neurological: Negative.   Psychiatric/Behavioral: Negative.      Per HPI unless specifically indicated above     Objective:    BP 130/85   Pulse (!) 104   Ht 6' 0.5 (1.842 m)   Wt 278 lb 9.6 oz (126.4 kg)   SpO2 98%   BMI 37.27 kg/m   Wt Readings from Last 3 Encounters:  02/08/24 278 lb 9.6 oz (126.4 kg)  08/06/23 265 lb 9.6 oz (120.5 kg)  03/05/23 264 lb (119.7 kg)    Physical Exam Vitals and nursing note reviewed.  Constitutional:      General: He is not in acute distress.    Appearance: Normal appearance. He is not ill-appearing, toxic-appearing  or diaphoretic.  HENT:     Head: Normocephalic and atraumatic.     Right Ear: External ear normal.     Left Ear: External ear normal.     Nose: Nose normal.     Mouth/Throat:     Mouth: Mucous membranes are moist.     Pharynx: Oropharynx is clear.  Eyes:     General: No scleral icterus.       Right eye: No discharge.        Left eye: No discharge.     Extraocular Movements: Extraocular movements intact.     Conjunctiva/sclera: Conjunctivae normal.     Pupils: Pupils are equal, round, and reactive to light.  Cardiovascular:     Rate and Rhythm: Normal rate and regular rhythm.     Pulses: Normal pulses.     Heart sounds: Normal heart sounds. No murmur heard.    No friction rub. No gallop.  Pulmonary:     Effort: Pulmonary effort is normal. No respiratory distress.     Breath sounds: Normal breath sounds. No stridor. No wheezing, rhonchi or rales.  Chest:     Chest wall: No tenderness.  Musculoskeletal:        General: Normal range of motion.     Cervical back: Normal range of motion and neck supple.  Skin:    General: Skin is warm and dry.     Capillary Refill: Capillary refill takes less than 2 seconds.  Coloration: Skin is not jaundiced or pale.     Findings: No bruising, erythema, lesion or rash.  Neurological:     General: No focal deficit present.     Mental Status: He is alert and oriented to person, place, and time. Mental status is at baseline.  Psychiatric:        Mood and Affect: Mood normal.        Behavior: Behavior normal.        Thought Content: Thought content normal.        Judgment: Judgment normal.     Results for orders placed or performed in visit on 08/06/23  Microalbumin, Urine Waived   Collection Time: 08/06/23  8:21 AM  Result Value Ref Range   Microalb, Ur Waived 80 (H) 0 - 19 mg/L   Creatinine, Urine Waived 300 10 - 300 mg/dL   Microalb/Creat Ratio 30-300 (H) <30 mg/g  CBC with Differential/Platelet   Collection Time: 08/06/23  2:56 PM   Result Value Ref Range   WBC 6.4 3.4 - 10.8 x10E3/uL   RBC 5.52 4.14 - 5.80 x10E6/uL   Hemoglobin 16.8 13.0 - 17.7 g/dL   Hematocrit 50.2 62.4 - 51.0 %   MCV 90 79 - 97 fL   MCH 30.4 26.6 - 33.0 pg   MCHC 33.8 31.5 - 35.7 g/dL   RDW 87.3 88.3 - 84.5 %   Platelets 209 150 - 450 x10E3/uL   Neutrophils 66 Not Estab. %   Lymphs 26 Not Estab. %   Monocytes 6 Not Estab. %   Eos 2 Not Estab. %   Basos 0 Not Estab. %   Neutrophils Absolute 4.1 1.4 - 7.0 x10E3/uL   Lymphocytes Absolute 1.7 0.7 - 3.1 x10E3/uL   Monocytes Absolute 0.4 0.1 - 0.9 x10E3/uL   EOS (ABSOLUTE) 0.1 0.0 - 0.4 x10E3/uL   Basophils Absolute 0.0 0.0 - 0.2 x10E3/uL   Immature Granulocytes 0 Not Estab. %   Immature Grans (Abs) 0.0 0.0 - 0.1 x10E3/uL  Comprehensive metabolic panel   Collection Time: 08/06/23  2:56 PM  Result Value Ref Range   Glucose 99 70 - 99 mg/dL   BUN 8 6 - 20 mg/dL   Creatinine, Ser 8.92 0.76 - 1.27 mg/dL   eGFR 95 >40 fO/fpw/8.26   BUN/Creatinine Ratio 7 (L) 9 - 20   Sodium 143 134 - 144 mmol/L   Potassium 4.5 3.5 - 5.2 mmol/L   Chloride 104 96 - 106 mmol/L   CO2 24 20 - 29 mmol/L   Calcium 9.8 8.7 - 10.2 mg/dL   Total Protein 6.6 6.0 - 8.5 g/dL   Albumin 4.8 4.1 - 5.1 g/dL   Globulin, Total 1.8 1.5 - 4.5 g/dL   Bilirubin Total 0.6 0.0 - 1.2 mg/dL   Alkaline Phosphatase 98 44 - 121 IU/L   AST 24 0 - 40 IU/L   ALT 42 0 - 44 IU/L  Lipid panel   Collection Time: 08/06/23  2:56 PM  Result Value Ref Range   Cholesterol, Total 179 100 - 199 mg/dL   Triglycerides 808 (H) 0 - 149 mg/dL   HDL 35 (L) >60 mg/dL   VLDL Cholesterol Cal 34 5 - 40 mg/dL   LDL Chol Calc (NIH) 889 (H) 0 - 99 mg/dL   Chol/HDL Ratio 5.1 (H) 0.0 - 5.0 ratio  TSH   Collection Time: 08/06/23  2:56 PM  Result Value Ref Range   TSH 1.270 0.450 - 4.500 uIU/mL  Assessment & Plan:   Problem List Items Addressed This Visit       Cardiovascular and Mediastinum   Primary hypertension - Primary   Under good  control on current regimen. Continue current regimen. Continue to monitor. Call with any concerns. Refills given. Labs drawn today.       Relevant Medications   lisinopril  (ZESTRIL ) 10 MG tablet   metoprolol  succinate (TOPROL -XL) 25 MG 24 hr tablet   Other Relevant Orders   Basic metabolic panel with GFR   Pfizer Comirnaty Covid -19 Vaccine 47yrs and older (Completed)     Digestive   Chronic GERD   Under good control on current regimen. Continue current regimen. Continue to monitor. Call with any concerns. Refills given. Labs drawn today.        Relevant Orders   CBC with Differential/Platelet   Other Visit Diagnoses       Hepatitis B vaccination status unknown       Labs drawn today.   Relevant Orders   Hepatitis B surface antibody,quantitative     Flu vaccine need       Flu shot given today.   Relevant Orders   Flu vaccine trivalent PF, 6mos and older(Flulaval,Afluria,Fluarix,Fluzone) (Completed)        Follow up plan: Return in about 6 months (around 08/07/2024) for physical.

## 2024-02-08 NOTE — Progress Notes (Signed)
 flu

## 2024-02-09 ENCOUNTER — Ambulatory Visit: Payer: Self-pay | Admitting: Family Medicine

## 2024-02-09 LAB — CBC WITH DIFFERENTIAL/PLATELET
Basophils Absolute: 0 x10E3/uL (ref 0.0–0.2)
Basos: 0 %
EOS (ABSOLUTE): 0.1 x10E3/uL (ref 0.0–0.4)
Eos: 1 %
Hematocrit: 50.5 % (ref 37.5–51.0)
Hemoglobin: 16.9 g/dL (ref 13.0–17.7)
Immature Grans (Abs): 0 x10E3/uL (ref 0.0–0.1)
Immature Granulocytes: 0 %
Lymphocytes Absolute: 1.4 x10E3/uL (ref 0.7–3.1)
Lymphs: 23 %
MCH: 30.8 pg (ref 26.6–33.0)
MCHC: 33.5 g/dL (ref 31.5–35.7)
MCV: 92 fL (ref 79–97)
Monocytes Absolute: 0.4 x10E3/uL (ref 0.1–0.9)
Monocytes: 6 %
Neutrophils Absolute: 4.2 x10E3/uL (ref 1.4–7.0)
Neutrophils: 70 %
Platelets: 216 x10E3/uL (ref 150–450)
RBC: 5.49 x10E6/uL (ref 4.14–5.80)
RDW: 12.5 % (ref 11.6–15.4)
WBC: 6.1 x10E3/uL (ref 3.4–10.8)

## 2024-02-09 LAB — BASIC METABOLIC PANEL WITH GFR
BUN/Creatinine Ratio: 13 (ref 9–20)
BUN: 12 mg/dL (ref 6–20)
CO2: 20 mmol/L (ref 20–29)
Calcium: 9.7 mg/dL (ref 8.7–10.2)
Chloride: 104 mmol/L (ref 96–106)
Creatinine, Ser: 0.96 mg/dL (ref 0.76–1.27)
Glucose: 96 mg/dL (ref 70–99)
Potassium: 4.9 mmol/L (ref 3.5–5.2)
Sodium: 141 mmol/L (ref 134–144)
eGFR: 108 mL/min/1.73 (ref >59)

## 2024-02-09 LAB — HEPATITIS B SURFACE ANTIBODY, QUANTITATIVE: Hepatitis B Surf Ab Quant: 13 m[IU]/mL

## 2024-03-08 DIAGNOSIS — F900 Attention-deficit hyperactivity disorder, predominantly inattentive type: Secondary | ICD-10-CM | POA: Diagnosis not present

## 2024-03-08 DIAGNOSIS — F331 Major depressive disorder, recurrent, moderate: Secondary | ICD-10-CM | POA: Diagnosis not present

## 2024-04-14 DIAGNOSIS — Z114 Encounter for screening for human immunodeficiency virus [HIV]: Secondary | ICD-10-CM | POA: Diagnosis not present

## 2024-04-14 DIAGNOSIS — F649 Gender identity disorder, unspecified: Secondary | ICD-10-CM | POA: Diagnosis not present

## 2024-04-14 DIAGNOSIS — Z113 Encounter for screening for infections with a predominantly sexual mode of transmission: Secondary | ICD-10-CM | POA: Diagnosis not present

## 2024-08-09 ENCOUNTER — Encounter: Admitting: Family Medicine
